# Patient Record
Sex: Male | Born: 1940
Health system: Southern US, Community
[De-identification: ages and names within clinical notes are randomized; demographics above are authoritative.]

## PROBLEM LIST (undated history)

## (undated) DIAGNOSIS — R351 Nocturia: Secondary | ICD-10-CM

## (undated) DIAGNOSIS — R7302 Impaired glucose tolerance (oral): Secondary | ICD-10-CM

## (undated) DIAGNOSIS — K429 Umbilical hernia without obstruction or gangrene: Secondary | ICD-10-CM

## (undated) DIAGNOSIS — K5730 Diverticulosis of large intestine without perforation or abscess without bleeding: Secondary | ICD-10-CM

## (undated) DIAGNOSIS — K219 Gastro-esophageal reflux disease without esophagitis: Secondary | ICD-10-CM

## (undated) DIAGNOSIS — R10816 Epigastric abdominal tenderness: Secondary | ICD-10-CM

## (undated) DIAGNOSIS — E119 Type 2 diabetes mellitus without complications: Secondary | ICD-10-CM

## (undated) DIAGNOSIS — R972 Elevated prostate specific antigen [PSA]: Secondary | ICD-10-CM

## (undated) DIAGNOSIS — F329 Major depressive disorder, single episode, unspecified: Secondary | ICD-10-CM

## (undated) DIAGNOSIS — K649 Unspecified hemorrhoids: Secondary | ICD-10-CM

## (undated) DIAGNOSIS — G8929 Other chronic pain: Secondary | ICD-10-CM

## (undated) DIAGNOSIS — J309 Allergic rhinitis, unspecified: Secondary | ICD-10-CM

## (undated) DIAGNOSIS — M5030 Other cervical disc degeneration, unspecified cervical region: Secondary | ICD-10-CM

## (undated) DIAGNOSIS — M722 Plantar fascial fibromatosis: Secondary | ICD-10-CM

## (undated) DIAGNOSIS — Z86010 Personal history of colonic polyps: Secondary | ICD-10-CM

## (undated) DIAGNOSIS — R5383 Other fatigue: Secondary | ICD-10-CM

## (undated) DIAGNOSIS — M542 Cervicalgia: Secondary | ICD-10-CM

## (undated) DIAGNOSIS — R05 Cough: Secondary | ICD-10-CM

## (undated) DIAGNOSIS — I700 Atherosclerosis of aorta: Secondary | ICD-10-CM

## (undated) DIAGNOSIS — M4800 Spinal stenosis, site unspecified: Secondary | ICD-10-CM

## (undated) DIAGNOSIS — L988 Other specified disorders of the skin and subcutaneous tissue: Secondary | ICD-10-CM

## (undated) DIAGNOSIS — D71 Functional disorders of polymorphonuclear neutrophils: Secondary | ICD-10-CM

## (undated) DIAGNOSIS — L0591 Pilonidal cyst without abscess: Secondary | ICD-10-CM

## (undated) DIAGNOSIS — I10 Essential (primary) hypertension: Secondary | ICD-10-CM

## (undated) DIAGNOSIS — R51 Headache: Secondary | ICD-10-CM

## (undated) DIAGNOSIS — K4090 Unilateral inguinal hernia, without obstruction or gangrene, not specified as recurrent: Secondary | ICD-10-CM

## (undated) DIAGNOSIS — F1021 Alcohol dependence, in remission: Secondary | ICD-10-CM

## (undated) DIAGNOSIS — E785 Hyperlipidemia, unspecified: Secondary | ICD-10-CM

## (undated) DIAGNOSIS — K589 Irritable bowel syndrome without diarrhea: Secondary | ICD-10-CM

## (undated) DIAGNOSIS — F411 Generalized anxiety disorder: Secondary | ICD-10-CM

## (undated) DIAGNOSIS — M545 Low back pain: Secondary | ICD-10-CM

## (undated) DIAGNOSIS — R5381 Other malaise: Secondary | ICD-10-CM

## (undated) HISTORY — PX: COLONOSCOPY: SHX174

## (undated) HISTORY — DX: Major depressive disorder, single episode, unspecified: F32.9

## (undated) HISTORY — DX: Other fatigue: R53.83

## (undated) HISTORY — DX: Irritable bowel syndrome without diarrhea: K58.9

## (undated) HISTORY — PX: BLADDER SURGERY: SHX569

## (undated) HISTORY — DX: Low back pain: M54.5

## (undated) HISTORY — DX: Alcohol dependence, in remission: F10.21

## (undated) HISTORY — DX: Type 2 diabetes mellitus without complications: E11.9

## (undated) HISTORY — DX: Personal history of colonic polyps: Z86.010

## (undated) HISTORY — DX: Other malaise: R53.81

## (undated) HISTORY — DX: Diverticulosis of large intestine without perforation or abscess without bleeding: K57.30

## (undated) HISTORY — DX: Essential (primary) hypertension: I10

## (undated) HISTORY — DX: Allergic rhinitis, unspecified: J30.9

## (undated) HISTORY — DX: Headache: R51

## (undated) HISTORY — DX: Nocturia: R35.1

## (undated) HISTORY — PX: OTHER SURGICAL HISTORY: SHX169

## (undated) HISTORY — DX: Hyperlipidemia, unspecified: E78.5

## (undated) HISTORY — DX: Other cervical disc degeneration, unspecified cervical region: M50.30

## (undated) HISTORY — DX: Cervicalgia: M54.2

## (undated) HISTORY — PX: PILONIDAL CYST EXCISION: SHX744

## (undated) HISTORY — DX: Cough: R05

## (undated) HISTORY — DX: Impaired glucose tolerance (oral): R73.02

## (undated) HISTORY — DX: Plantar fascial fibromatosis: M72.2

## (undated) HISTORY — DX: Epigastric abdominal tenderness: R10.816

## (undated) HISTORY — DX: Gastro-esophageal reflux disease without esophagitis: K21.9

## (undated) HISTORY — DX: Pilonidal cyst without abscess: L05.91

## (undated) HISTORY — PX: CERVICAL FUSION: SHX112

## (undated) HISTORY — DX: Generalized anxiety disorder: F41.1

---

## 1998-11-11 ENCOUNTER — Other Ambulatory Visit: Admission: RE | Admit: 1998-11-11 | Discharge: 1998-11-11 | Payer: Self-pay | Admitting: Gastroenterology

## 1999-02-15 ENCOUNTER — Ambulatory Visit (HOSPITAL_BASED_OUTPATIENT_CLINIC_OR_DEPARTMENT_OTHER): Admission: RE | Admit: 1999-02-15 | Discharge: 1999-02-15 | Payer: Self-pay | Admitting: Orthopedic Surgery

## 2001-04-16 ENCOUNTER — Encounter: Payer: Self-pay | Admitting: Urology

## 2001-04-22 ENCOUNTER — Inpatient Hospital Stay (HOSPITAL_COMMUNITY): Admission: RE | Admit: 2001-04-22 | Discharge: 2001-04-24 | Payer: Self-pay | Admitting: Urology

## 2001-04-22 ENCOUNTER — Encounter (INDEPENDENT_AMBULATORY_CARE_PROVIDER_SITE_OTHER): Payer: Self-pay

## 2004-09-16 ENCOUNTER — Ambulatory Visit: Payer: Self-pay | Admitting: Internal Medicine

## 2005-02-27 ENCOUNTER — Ambulatory Visit: Payer: Self-pay | Admitting: Internal Medicine

## 2005-09-14 ENCOUNTER — Ambulatory Visit: Payer: Self-pay | Admitting: Internal Medicine

## 2005-10-03 ENCOUNTER — Ambulatory Visit: Payer: Self-pay

## 2005-10-12 ENCOUNTER — Ambulatory Visit: Payer: Self-pay | Admitting: *Deleted

## 2005-10-20 ENCOUNTER — Ambulatory Visit: Payer: Self-pay | Admitting: Internal Medicine

## 2006-10-31 ENCOUNTER — Ambulatory Visit: Payer: Self-pay | Admitting: Internal Medicine

## 2006-10-31 LAB — CONVERTED CEMR LAB
ALT: 24 units/L (ref 0–40)
AST: 32 units/L (ref 0–37)
Albumin: 4.2 g/dL (ref 3.5–5.2)
Alkaline Phosphatase: 56 units/L (ref 39–117)
BUN: 9 mg/dL (ref 6–23)
Basophils Absolute: 0.1 10*3/uL (ref 0.0–0.1)
Basophils Relative: 1.5 % — ABNORMAL HIGH (ref 0.0–1.0)
CO2: 30 meq/L (ref 19–32)
Calcium: 9.7 mg/dL (ref 8.4–10.5)
Chloride: 104 meq/L (ref 96–112)
Chol/HDL Ratio, serum: 5.5
Cholesterol: 237 mg/dL (ref 0–200)
Creatinine, Ser: 0.9 mg/dL (ref 0.4–1.5)
Eosinophil percent: 1.4 % (ref 0.0–5.0)
GFR calc non Af Amer: 91 mL/min
Glomerular Filtration Rate, Af Am: 110 mL/min/{1.73_m2}
Glucose, Bld: 97 mg/dL (ref 70–99)
HCT: 42.5 % (ref 39.0–52.0)
HDL: 43.2 mg/dL (ref >39.0)
Hemoglobin: 15.4 g/dL (ref 13.0–17.0)
LDL DIRECT: 131.9 mg/dL
Lymphocytes Relative: 29.2 % (ref 12.0–46.0)
MCHC: 36 g/dL (ref 30.0–36.0)
MCV: 87.6 fL (ref 78.0–100.0)
Monocytes Absolute: 0.6 10*3/uL (ref 0.2–0.7)
Monocytes Relative: 8.2 % (ref 3.0–11.0)
Neutro Abs: 4.2 10*3/uL (ref 1.4–7.7)
Neutrophils Relative %: 59.7 % (ref 43.0–77.0)
PSA: 2.17 ng/mL
PSA: 2.17 ng/mL (ref 0.10–4.00)
Platelets: 276 10*3/uL (ref 150–400)
Potassium: 4.8 meq/L (ref 3.5–5.1)
RBC: 4.85 M/uL (ref 4.22–5.81)
RDW: 11.9 % (ref 11.5–14.6)
Sodium: 140 meq/L (ref 135–145)
TSH: 1.28 microintl units/mL (ref 0.35–5.50)
Total Bilirubin: 0.8 mg/dL (ref 0.3–1.2)
Total Protein: 7.3 g/dL (ref 6.0–8.3)
Triglyceride fasting, serum: 250 mg/dL (ref 0–149)
VLDL: 50 mg/dL — ABNORMAL HIGH (ref 0–40)
WBC: 7 10*3/uL (ref 4.5–10.5)

## 2006-12-04 ENCOUNTER — Ambulatory Visit: Payer: Self-pay | Admitting: Internal Medicine

## 2006-12-04 LAB — CONVERTED CEMR LAB
ALT: 31 units/L (ref 0–40)
AST: 28 units/L (ref 0–37)
Albumin: 3.7 g/dL (ref 3.5–5.2)
Alkaline Phosphatase: 50 units/L (ref 39–117)
Bilirubin, Direct: 0.2 mg/dL (ref 0.0–0.3)
Cholesterol: 138 mg/dL (ref 0–200)
HDL: 44.7 mg/dL (ref >39.0)
LDL Cholesterol: 65 mg/dL (ref 0–99)
Total Bilirubin: 0.6 mg/dL (ref 0.3–1.2)
Total CHOL/HDL Ratio: 3.1
Total Protein: 7.1 g/dL (ref 6.0–8.3)
Triglycerides: 141 mg/dL (ref 0–149)
VLDL: 28 mg/dL (ref 0–40)

## 2007-06-07 ENCOUNTER — Ambulatory Visit: Payer: Self-pay | Admitting: Internal Medicine

## 2007-06-07 LAB — CONVERTED CEMR LAB
ALT: 27 units/L (ref 0–53)
AST: 41 units/L — ABNORMAL HIGH (ref 0–37)
Albumin: 4.1 g/dL (ref 3.5–5.2)
Alkaline Phosphatase: 51 units/L (ref 39–117)
Bilirubin, Direct: 0.3 mg/dL (ref 0.0–0.3)
Cholesterol: 166 mg/dL (ref 0–200)
Direct LDL: 95 mg/dL
HDL: 45.2 mg/dL (ref >39.0)
PSA: 2.33 ng/mL (ref 0.10–4.00)
Total Bilirubin: 1.3 mg/dL — ABNORMAL HIGH (ref 0.3–1.2)
Total CHOL/HDL Ratio: 3.7
Total Protein: 7.5 g/dL (ref 6.0–8.3)
Triglycerides: 212 mg/dL (ref 0–149)
VLDL: 42 mg/dL — ABNORMAL HIGH (ref 0–40)

## 2007-06-08 ENCOUNTER — Encounter: Payer: Self-pay | Admitting: Internal Medicine

## 2007-06-08 DIAGNOSIS — R51 Headache: Secondary | ICD-10-CM

## 2007-06-08 DIAGNOSIS — K589 Irritable bowel syndrome without diarrhea: Secondary | ICD-10-CM | POA: Insufficient documentation

## 2007-06-08 DIAGNOSIS — F329 Major depressive disorder, single episode, unspecified: Secondary | ICD-10-CM

## 2007-06-08 DIAGNOSIS — R519 Headache, unspecified: Secondary | ICD-10-CM | POA: Insufficient documentation

## 2007-06-08 DIAGNOSIS — F1021 Alcohol dependence, in remission: Secondary | ICD-10-CM

## 2007-06-08 DIAGNOSIS — Z860100 Personal history of colon polyps, unspecified: Secondary | ICD-10-CM

## 2007-06-08 DIAGNOSIS — K5730 Diverticulosis of large intestine without perforation or abscess without bleeding: Secondary | ICD-10-CM

## 2007-06-08 DIAGNOSIS — E785 Hyperlipidemia, unspecified: Secondary | ICD-10-CM

## 2007-06-08 DIAGNOSIS — M722 Plantar fascial fibromatosis: Secondary | ICD-10-CM

## 2007-06-08 DIAGNOSIS — K219 Gastro-esophageal reflux disease without esophagitis: Secondary | ICD-10-CM

## 2007-06-08 DIAGNOSIS — I10 Essential (primary) hypertension: Secondary | ICD-10-CM

## 2007-06-08 DIAGNOSIS — F411 Generalized anxiety disorder: Secondary | ICD-10-CM

## 2007-06-08 DIAGNOSIS — J309 Allergic rhinitis, unspecified: Secondary | ICD-10-CM

## 2007-06-08 DIAGNOSIS — L0591 Pilonidal cyst without abscess: Secondary | ICD-10-CM | POA: Insufficient documentation

## 2007-06-08 DIAGNOSIS — F3289 Other specified depressive episodes: Secondary | ICD-10-CM | POA: Insufficient documentation

## 2007-06-08 HISTORY — DX: Essential (primary) hypertension: I10

## 2007-06-08 HISTORY — DX: Allergic rhinitis, unspecified: J30.9

## 2007-06-08 HISTORY — DX: Other specified depressive episodes: F32.89

## 2007-06-08 HISTORY — DX: Personal history of colon polyps, unspecified: Z86.0100

## 2007-06-08 HISTORY — DX: Alcohol dependence, in remission: F10.21

## 2007-06-08 HISTORY — DX: Plantar fascial fibromatosis: M72.2

## 2007-06-08 HISTORY — DX: Headache: R51

## 2007-06-08 HISTORY — DX: Personal history of colonic polyps: Z86.010

## 2007-06-08 HISTORY — DX: Generalized anxiety disorder: F41.1

## 2007-06-08 HISTORY — DX: Pilonidal cyst without abscess: L05.91

## 2007-06-08 HISTORY — DX: Major depressive disorder, single episode, unspecified: F32.9

## 2007-06-08 HISTORY — DX: Hyperlipidemia, unspecified: E78.5

## 2007-06-08 HISTORY — DX: Diverticulosis of large intestine without perforation or abscess without bleeding: K57.30

## 2007-06-08 HISTORY — DX: Irritable bowel syndrome, unspecified: K58.9

## 2007-06-08 HISTORY — DX: Gastro-esophageal reflux disease without esophagitis: K21.9

## 2007-07-30 ENCOUNTER — Ambulatory Visit: Payer: Self-pay | Admitting: Internal Medicine

## 2007-07-30 ENCOUNTER — Encounter: Payer: Self-pay | Admitting: Internal Medicine

## 2007-07-30 DIAGNOSIS — M5030 Other cervical disc degeneration, unspecified cervical region: Secondary | ICD-10-CM

## 2007-07-30 HISTORY — DX: Other cervical disc degeneration, unspecified cervical region: M50.30

## 2007-11-11 ENCOUNTER — Encounter: Payer: Self-pay | Admitting: Internal Medicine

## 2007-11-25 ENCOUNTER — Encounter: Payer: Self-pay | Admitting: Internal Medicine

## 2007-11-25 ENCOUNTER — Telehealth: Payer: Self-pay | Admitting: Internal Medicine

## 2007-12-05 ENCOUNTER — Ambulatory Visit: Payer: Self-pay | Admitting: Internal Medicine

## 2007-12-05 DIAGNOSIS — M542 Cervicalgia: Secondary | ICD-10-CM

## 2007-12-05 DIAGNOSIS — R10816 Epigastric abdominal tenderness: Secondary | ICD-10-CM

## 2007-12-05 HISTORY — DX: Epigastric abdominal tenderness: R10.816

## 2007-12-05 HISTORY — DX: Cervicalgia: M54.2

## 2008-09-10 ENCOUNTER — Ambulatory Visit: Payer: Self-pay | Admitting: Internal Medicine

## 2008-09-10 DIAGNOSIS — R5383 Other fatigue: Secondary | ICD-10-CM

## 2008-09-10 DIAGNOSIS — R5381 Other malaise: Secondary | ICD-10-CM

## 2008-09-10 HISTORY — DX: Other malaise: R53.81

## 2008-09-10 LAB — CONVERTED CEMR LAB: Vit D, 1,25-Dihydroxy: 30 (ref 30–89)

## 2008-09-11 LAB — CONVERTED CEMR LAB
ALT: 23 units/L (ref 0–53)
AST: 33 units/L (ref 0–37)
Albumin: 4.2 g/dL (ref 3.5–5.2)
Alkaline Phosphatase: 50 units/L (ref 39–117)
BUN: 14 mg/dL (ref 6–23)
Basophils Absolute: 0 10*3/uL (ref 0.0–0.1)
Basophils Relative: 0.8 % (ref 0.0–3.0)
Bilirubin Urine: NEGATIVE
Bilirubin, Direct: 0.2 mg/dL (ref 0.0–0.3)
CO2: 27 meq/L (ref 19–32)
Calcium: 9.4 mg/dL (ref 8.4–10.5)
Chloride: 103 meq/L (ref 96–112)
Creatinine, Ser: 0.8 mg/dL (ref 0.4–1.5)
Eosinophils Absolute: 0.1 10*3/uL (ref 0.0–0.7)
Eosinophils Relative: 2.1 % (ref 0.0–5.0)
GFR calc Af Amer: 125 mL/min
GFR calc non Af Amer: 103 mL/min
Glucose, Bld: 130 mg/dL — ABNORMAL HIGH (ref 70–99)
HCT: 44.3 % (ref 39.0–52.0)
Hemoglobin, Urine: NEGATIVE
Hemoglobin: 15.5 g/dL (ref 13.0–17.0)
Hgb A1c MFr Bld: 6.1 % — ABNORMAL HIGH (ref 4.6–6.0)
Ketones, ur: NEGATIVE mg/dL
Leukocytes, UA: NEGATIVE
Lymphocytes Relative: 32.5 % (ref 12.0–46.0)
MCHC: 35 g/dL (ref 30.0–36.0)
MCV: 91.9 fL (ref 78.0–100.0)
Monocytes Absolute: 0.6 10*3/uL (ref 0.1–1.0)
Monocytes Relative: 12.3 % — ABNORMAL HIGH (ref 3.0–12.0)
Neutro Abs: 2.5 10*3/uL (ref 1.4–7.7)
Neutrophils Relative %: 52.3 % (ref 43.0–77.0)
Nitrite: NEGATIVE
PSA: 1.87 ng/mL (ref 0.10–4.00)
Platelets: 234 10*3/uL (ref 150–400)
Potassium: 4.2 meq/L (ref 3.5–5.1)
RBC: 4.82 M/uL (ref 4.22–5.81)
RDW: 11.5 % (ref 11.5–14.6)
Sodium: 137 meq/L (ref 135–145)
Specific Gravity, Urine: 1.02 (ref 1.000–1.03)
TSH: 1.31 microintl units/mL (ref 0.35–5.50)
Total Bilirubin: 1.3 mg/dL — ABNORMAL HIGH (ref 0.3–1.2)
Total Protein, Urine: NEGATIVE mg/dL
Total Protein: 7.3 g/dL (ref 6.0–8.3)
Urine Glucose: NEGATIVE mg/dL
Urobilinogen, UA: 0.2 (ref 0.0–1.0)
WBC: 4.8 10*3/uL (ref 4.5–10.5)
pH: 6 (ref 5.0–8.0)

## 2008-10-19 ENCOUNTER — Ambulatory Visit: Payer: Self-pay | Admitting: Internal Medicine

## 2008-11-03 ENCOUNTER — Encounter: Payer: Self-pay | Admitting: Internal Medicine

## 2008-11-03 ENCOUNTER — Ambulatory Visit: Payer: Self-pay | Admitting: Internal Medicine

## 2008-11-05 ENCOUNTER — Encounter: Payer: Self-pay | Admitting: Internal Medicine

## 2009-07-16 ENCOUNTER — Ambulatory Visit: Payer: Self-pay | Admitting: Internal Medicine

## 2009-07-16 DIAGNOSIS — R351 Nocturia: Secondary | ICD-10-CM

## 2009-07-16 HISTORY — DX: Nocturia: R35.1

## 2009-07-16 LAB — CONVERTED CEMR LAB
ALT: 25 units/L (ref 0–53)
AST: 33 units/L (ref 0–37)
Albumin: 4.2 g/dL (ref 3.5–5.2)
Alkaline Phosphatase: 44 units/L (ref 39–117)
BUN: 13 mg/dL (ref 6–23)
Basophils Absolute: 0 10*3/uL (ref 0.0–0.1)
Basophils Relative: 0 % (ref 0.0–3.0)
Bilirubin Urine: NEGATIVE
Bilirubin, Direct: 0.2 mg/dL (ref 0.0–0.3)
CO2: 28 meq/L (ref 19–32)
Calcium: 9.2 mg/dL (ref 8.4–10.5)
Chloride: 107 meq/L (ref 96–112)
Cholesterol: 144 mg/dL (ref 0–200)
Creatinine, Ser: 0.9 mg/dL (ref 0.4–1.5)
Eosinophils Absolute: 0.2 10*3/uL (ref 0.0–0.7)
Eosinophils Relative: 2.9 % (ref 0.0–5.0)
GFR calc non Af Amer: 89.79 mL/min (ref >60)
Glucose, Bld: 120 mg/dL — ABNORMAL HIGH (ref 70–99)
HCT: 42.7 % (ref 39.0–52.0)
HDL: 49 mg/dL (ref >39.00)
Hemoglobin, Urine: NEGATIVE
Hemoglobin: 14.8 g/dL (ref 13.0–17.0)
Hgb A1c MFr Bld: 6 % (ref 4.6–6.5)
Ketones, ur: NEGATIVE mg/dL
LDL Cholesterol: 82 mg/dL (ref 0–99)
Leukocytes, UA: NEGATIVE
Lymphocytes Relative: 30.8 % (ref 12.0–46.0)
Lymphs Abs: 1.7 10*3/uL (ref 0.7–4.0)
MCHC: 34.8 g/dL (ref 30.0–36.0)
MCV: 91 fL (ref 78.0–100.0)
Monocytes Absolute: 0.6 10*3/uL (ref 0.1–1.0)
Monocytes Relative: 10 % (ref 3.0–12.0)
Neutro Abs: 3.1 10*3/uL (ref 1.4–7.7)
Neutrophils Relative %: 56.3 % (ref 43.0–77.0)
Nitrite: NEGATIVE
PSA: 1.7 ng/mL (ref 0.10–4.00)
Platelets: 209 10*3/uL (ref 150.0–400.0)
Potassium: 4.1 meq/L (ref 3.5–5.1)
RBC: 4.69 M/uL (ref 4.22–5.81)
RDW: 11.5 % (ref 11.5–14.6)
Sodium: 141 meq/L (ref 135–145)
Specific Gravity, Urine: >=1.03 (ref 1.000–1.030)
TSH: 1.66 microintl units/mL (ref 0.35–5.50)
Total Bilirubin: 1.4 mg/dL — ABNORMAL HIGH (ref 0.3–1.2)
Total CHOL/HDL Ratio: 3
Total Protein, Urine: NEGATIVE mg/dL
Total Protein: 7.8 g/dL (ref 6.0–8.3)
Triglycerides: 67 mg/dL (ref 0.0–149.0)
Urine Glucose: NEGATIVE mg/dL
Urobilinogen, UA: 0.2 (ref 0.0–1.0)
VLDL: 13.4 mg/dL (ref 0.0–40.0)
WBC: 5.6 10*3/uL (ref 4.5–10.5)
pH: 5.5 (ref 5.0–8.0)

## 2009-10-25 ENCOUNTER — Telehealth: Payer: Self-pay | Admitting: Internal Medicine

## 2010-08-12 ENCOUNTER — Ambulatory Visit: Payer: Self-pay | Admitting: Internal Medicine

## 2010-08-12 DIAGNOSIS — R05 Cough: Secondary | ICD-10-CM

## 2010-08-12 DIAGNOSIS — R059 Cough, unspecified: Secondary | ICD-10-CM

## 2010-08-12 DIAGNOSIS — M545 Low back pain, unspecified: Secondary | ICD-10-CM

## 2010-08-12 DIAGNOSIS — G8929 Other chronic pain: Secondary | ICD-10-CM | POA: Insufficient documentation

## 2010-08-12 HISTORY — DX: Low back pain, unspecified: M54.50

## 2010-08-12 HISTORY — DX: Cough, unspecified: R05.9

## 2010-08-15 LAB — CONVERTED CEMR LAB
ALT: 37 units/L (ref 0–53)
AST: 42 units/L — ABNORMAL HIGH (ref 0–37)
Alkaline Phosphatase: 62 units/L (ref 39–117)
BUN: 11 mg/dL (ref 6–23)
Basophils Absolute: 0 10*3/uL (ref 0.0–0.1)
Bilirubin Urine: NEGATIVE
Calcium: 9.4 mg/dL (ref 8.4–10.5)
Eosinophils Relative: 1.7 % (ref 0.0–5.0)
GFR calc non Af Amer: 93.07 mL/min (ref >60)
Glucose, Bld: 159 mg/dL — ABNORMAL HIGH (ref 70–99)
HDL: 42.6 mg/dL (ref >39.00)
Hemoglobin, Urine: NEGATIVE
Hemoglobin: 15 g/dL (ref 13.0–17.0)
Leukocytes, UA: NEGATIVE
Lymphocytes Relative: 25.1 % (ref 12.0–46.0)
Monocytes Relative: 7.3 % (ref 3.0–12.0)
Nitrite: NEGATIVE
Platelets: 260 10*3/uL (ref 150.0–400.0)
Potassium: 5.2 meq/L — ABNORMAL HIGH (ref 3.5–5.1)
RDW: 12.3 % (ref 11.5–14.6)
Sodium: 138 meq/L (ref 135–145)
Total Bilirubin: 0.8 mg/dL (ref 0.3–1.2)
Total CHOL/HDL Ratio: 5
Total Protein, Urine: NEGATIVE mg/dL
Triglycerides: 377 mg/dL — ABNORMAL HIGH (ref 0.0–149.0)
Urobilinogen, UA: 0.2 (ref 0.0–1.0)
VLDL: 75.4 mg/dL — ABNORMAL HIGH (ref 0.0–40.0)
WBC: 7.7 10*3/uL (ref 4.5–10.5)

## 2010-11-29 NOTE — Assessment & Plan Note (Signed)
Summary: FU---MEDS---STC   Vital Signs:  Patient profile:   70 year old male Height:      70 inches Weight:      205.25 pounds BMI:     29.56 O2 Sat:      95 % on Room air Temp:     98 degrees F oral Pulse rate:   77 / minute BP sitting:   140 / 98  (left arm) Cuff size:   large  Vitals Entered By: Zella Ball Ewing CMA Duncan Dull) (August 12, 2010 2:39 PM)  O2 Flow:  Room air CC: followup/RE   CC:  followup/RE.  History of Present Illness: here for f/u - overall doing well, but has dry cough for 1 mo also assoc with intermittent ST;  ;  wt overall stable but has occasionaly reflux symtpoms now well controlled with current tx and diet;  zantac seemed to help somewhat; no dysphagia, n/v, abd pain, bowel change or blood. BP at home usually < 14090;  Pt denies other CP, nasal allergy symptoms, worsening sob, doe, wheezing, orthopnea, pnd, worsening LE edema, palps, dizziness or syncope Pt denies new neuro symptoms such as headache, facial or extremity weakness Does have nightly chronic LBP without change in freq, severity, or other character such as worsening LE pain/weaknumb, gait change or fall .  Pain during the day manageable.    Preventive Screening-Counseling & Management      Drug Use:  no.    Problems Prior to Update: 1)  Fatigue  (ICD-780.79) 2)  Cough  (ICD-786.2) 3)  Nocturia  (ICD-788.43) 4)  Fatigue  (ICD-780.79) 5)  Special Screening Malig Neoplasms Other Sites  (ICD-V76.49) 6)  Fatigue  (ICD-780.79) 7)  Cervicalgia  (ICD-723.1) 8)  Epigastric Tenderness  (ICD-789.66) 9)  Hx of Degeneration, Cervical Disc  (ICD-722.4) 10)  Pilonidal Cyst  (ICD-685.1) 11)  Colonic Polyps, Hx of  (ICD-V12.72) 12)  Plantar Fasciitis  (ICD-728.71) 13)  Anxiety  (ICD-300.00) 14)  Allergic Rhinitis  (ICD-477.9) 15)  Dependence, Alcohol Nec/nos, in Remission  (ICD-303.93) 16)  Ibs  (ICD-564.1) 17)  Hypertension  (ICD-401.9) 18)  Hyperlipidemia  (ICD-272.4) 19)  Headache  (ICD-784.0) 20)   Gerd  (ICD-530.81) 21)  Diverticulosis, Colon  (ICD-562.10) 22)  Depression  (ICD-311)  Medications Prior to Update: 1)  Lisinopril 40 Mg Tabs (Lisinopril) .... Take 1 Tablet By Mouth Once A Day 2)  Simvastatin 80 Mg Tabs (Simvastatin) .... Take 1 Tablet By Mouth Once A Day 3)  Hydrocodone-Acetaminophen 10-325 Mg Tabs (Hydrocodone-Acetaminophen) .Marland Kitchen.. 1 By Mouth Q 12 Hrs As Needed Pain 4)  Adult Aspirin Ec Low Strength 81 Mg Tbec (Aspirin) .Marland Kitchen.. 1 By Mouth Once Daily 5)  Toprol Xl 50 Mg Xr24h-Tab (Metoprolol Succinate) .... Take Half Tab Once Daily 6)  Daily Multiple Vitamins  Tabs (Multiple Vitamin) .Marland Kitchen.. 1 By Mouth Once Daily 7)  Glucosamine 500 Mg Caps (Glucosamine Sulfate) .... Use As Directed 8)  Omega 3 340 Mg Cpdr (Omega-3 Fatty Acids) .Marland Kitchen.. 1 By Mouth Once Daily 9)  Amlodipine Besylate 2.5 Mg Tabs (Amlodipine Besylate) .Marland Kitchen.. 1 By Mouth Once Daily 10)  Prilosec Otc 20 Mg Tbec (Omeprazole Magnesium) .Marland Kitchen.. 1 By Mouth Once Daily  Current Medications (verified): 1)  Lisinopril 40 Mg Tabs (Lisinopril) .... Take 1 Tablet By Mouth Once A Day 2)  Simvastatin 40 Mg Tabs (Simvastatin) .Marland Kitchen.. 1po Once Daily 3)  Hydrocodone-Acetaminophen 10-325 Mg Tabs (Hydrocodone-Acetaminophen) .Marland Kitchen.. 1 By Mouth Q 12 Hrs As Needed Pain 4)  Adult Aspirin Ec Low  Strength 81 Mg Tbec (Aspirin) .Marland Kitchen.. 1 By Mouth Once Daily 5)  Toprol Xl 50 Mg Xr24h-Tab (Metoprolol Succinate) .... Take Half Tab Once Daily 6)  Daily Multiple Vitamins  Tabs (Multiple Vitamin) .Marland Kitchen.. 1 By Mouth Once Daily 7)  Glucosamine 500 Mg Caps (Glucosamine Sulfate) .... Use As Directed 8)  Omega 3 340 Mg Cpdr (Omega-3 Fatty Acids) .Marland Kitchen.. 1 By Mouth Once Daily 9)  Amlodipine Besylate 2.5 Mg Tabs (Amlodipine Besylate) .Marland Kitchen.. 1 By Mouth Once Daily 10)  Lansoprazole 30 Mg Tbdp (Lansoprazole) .Marland Kitchen.. 1 By Mouth Once Daily  Allergies (verified): 1)  ! Sulfa 2)  ! Doxycycline Hyclate (Doxycycline Hyclate)  Past History:  Past Medical History: Last updated:  07/30/2007 Depression Diverticulosis, colon GERD Headache Hyperlipidemia Hypertension IBS HX Alcohol Dependency Allergic rhinitis Anxiety Plantar Fascitis Colonic polyps, hx of chronic neck pain  Past Surgical History: Last updated: 07/30/2007 Pilonidal Cyst Surgery Bladder and Prostate Surgery Cervical fusion  Family History: Last updated: 12/05/2007 father with kidney disease mother with mature heart disease  Social History: Last updated: 08/12/2010 Former Smoker Alcohol use-no Divorced 3 children retired Conservator, 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    Physical Exam  General:  alert and overweight-appearing.   Head:  normocephalic and atraumatic.   Eyes:  vision grossly intact, pupils equal, and pupils round.   Ears:  R ear normal and L ear normal.   Nose:  no external deformity and no nasal discharge.   Mouth:  no gingival abnormalities and pharynx pink and moist.   Neck:  supple and no masses.   Lungs:  normal respiratory effort and normal breath sounds.   Heart:  normal rate and regular rhythm.   Abdomen:  soft, non-tender, and normal bowel sounds.   Msk:  no joint tenderness and no joint swelling.   Extremities:  no edema, no erythema  Neurologic:  cranial nerves II-XII intact and strength normal in all extremities.   Skin:  color normal and no rashes.   Psych:  not depressed appearing and moderately anxious.     Impression & Recommendations:  Problem # 1:  COUGH (ICD-786.2)  suspect gerd related - for cxr, but also generic prevacid (or otc omeprazole if too expensive)  Orders: T-2 View CXR,  Same Day (71020.5TC)  Problem # 2:  HYPERTENSION (ICD-401.9)  His updated medication list for this problem includes:    Lisinopril 40 Mg Tabs (Lisinopril) .Marland Kitchen... Take 1 tablet by mouth once a day    Toprol Xl 50 Mg Xr24h-tab (Metoprolol succinate) .Marland Kitchen... Take half tab once daily    Amlodipine Besylate 2.5 Mg Tabs (Amlodipine besylate) .Marland Kitchen... 1 by mouth once daily  Orders: TLB-Udip ONLY (81003-UDIP)  BP today: 140/98 Prior BP: 136/96 (07/16/2009)  Labs Reviewed: K+: 4.1 (07/16/2009) Creat: : 0.9 (07/16/2009)   Chol: 144 (07/16/2009)   HDL: 49.00 (07/16/2009)   LDL: 82 (07/16/2009)   TG: 67.0 (07/16/2009) stable overall by hx and exam, ok to continue meds/tx as is , f/u closely at home and next visit  Problem # 3:  HYPERLIPIDEMIA (ICD-272.4)  His updated medication list for this problem includes:    Simvastatin 40 Mg  Tabs (Simvastatin) .Marland Kitchen... 1po once daily  Labs Reviewed: SGOT: 33 (07/16/2009)   SGPT: 25 (07/16/2009)   HDL:49.00 (07/16/2009), 45.2 (06/07/2007)  LDL:82 (07/16/2009), DEL (16/07/9603)  Chol:144 (07/16/2009), 166 (06/07/2007)  Trig:67.0 (07/16/2009), 212 (06/07/2007) to decr the zocor to 40 mg to avoid the risky 80 mg strength per the FDA warning of risk  Orders: TLB-Lipid Panel (80061-LIPID)  Problem # 4:  FATIGUE (ICD-780.79)  exam benign, to check labs below; follow with expectant management   Orders: TLB-BMP (Basic Metabolic Panel-BMET) (80048-METABOL) TLB-CBC Platelet - w/Differential (85025-CBCD) TLB-Hepatic/Liver Function Pnl (80076-HEPATIC) TLB-TSH (Thyroid Stimulating Hormone) (84443-TSH) TLB-Sedimentation Rate (ESR) (85652-ESR)  Problem # 5:  LOW BACK PAIN, CHRONIC (ICD-724.2)  His updated medication list for this problem includes:    Hydrocodone-acetaminophen 10-325 Mg Tabs (Hydrocodone-acetaminophen) .Marland Kitchen... 1 by mouth q 12 hrs as needed pain    Adult Aspirin Ec Low Strength 81 Mg Tbec (Aspirin) .Marland Kitchen... 1 by mouth once daily and chronic cervical neck  pain; exam stable today, Continue all previous medications as before this visit   Complete Medication List: 1)  Lisinopril 40 Mg Tabs (Lisinopril) .... Take 1 tablet by mouth once a day 2)  Simvastatin 40 Mg Tabs (Simvastatin) .Marland Kitchen.. 1po once daily 3)  Hydrocodone-acetaminophen 10-325 Mg Tabs (Hydrocodone-acetaminophen) .Marland Kitchen.. 1 by mouth q 12 hrs as needed pain 4)  Adult Aspirin Ec Low Strength 81 Mg Tbec (Aspirin) .Marland Kitchen.. 1 by mouth once daily 5)  Toprol Xl 50 Mg Xr24h-tab (Metoprolol succinate) .... Take half tab once daily 6)  Daily Multiple Vitamins Tabs (Multiple vitamin) .Marland Kitchen.. 1 by mouth once daily 7)  Glucosamine 500 Mg Caps (Glucosamine sulfate) .... Use as directed 8)  Omega 3 340 Mg Cpdr (Omega-3 fatty acids) .Marland Kitchen.. 1 by mouth once daily 9)  Amlodipine Besylate 2.5 Mg Tabs (Amlodipine besylate) .Marland Kitchen.. 1 by mouth once daily 10)  Lansoprazole 30 Mg Tbdp (Lansoprazole) .Marland Kitchen.. 1 by mouth once daily  Other Orders: Flu Vaccine 97yrs + MEDICARE PATIENTS (V4098) Administration Flu vaccine - MCR (G0008) TLB-PSA (Prostate Specific Antigen) (84153-PSA)  Patient Instructions: 1)  Please take all new medications as prescribed - the generic prevacid (or the OTC prilosec if too expensive) 2)  Please go to Radiology in the basement level for your X-Ray today  3)  Please go to the Lab in the basement for your blood and/or urine tests today  4)  Please call the number on the [facility] Card for results of your testing  5)  All of your medications were refilled today, and we had to decrease the simvastatin from 80 to 40 mg per day due to FDA warning regarding too much risk of side effect with the 80 mg 6)  Please schedule a follow-up appointment in 6 months. 7)  Check your Blood Pressure regularly. If it is above 140/90: you should make an appointment. 8)  Please bring your BP machine with you to the next visit to compare Prescriptions: LISINOPRIL 40 MG TABS (LISINOPRIL) Take 1 tablet by mouth once a day  #90 x 3    Entered and Authorized by:   Corwin Levins MD   Signed by:   Corwin Levins MD on 08/12/2010   Method used:   Print then Give to Patient   RxID:   [id number] SIMVASTATIN 40 MG TABS (SIMVASTATIN) 1po once daily  #90 x 3   Entered and Authorized by:   Corwin Levins MD   Signed by:   Corwin Levins MD on 08/12/2010  Method used:   Print then Give to Patient   RxID:   [id number] HYDROCODONE-ACETAMINOPHEN 10-325 MG TABS (HYDROCODONE-ACETAMINOPHEN) 1 by mouth q 12 hrs as needed pain  #60 x 3   Entered and Authorized by:   Corwin Levins MD   Signed by:   Corwin Levins MD on 08/12/2010   Method used:   Print then Give to Patient   RxID:   [id number] TOPROL XL 50 MG XR24H-TAB (METOPROLOL SUCCINATE) take half tab once daily  #90 x 3   Entered and Authorized by:   Corwin Levins MD   Signed by:   Corwin Levins MD on 08/12/2010   Method used:   Print then Give to Patient   RxID:   [id number] AMLODIPINE BESYLATE 2.5 MG TABS (AMLODIPINE BESYLATE) 1 by mouth once daily  #90 x 3   Entered and Authorized by:   Corwin Levins MD   Signed by:   Corwin Levins MD on 08/12/2010   Method used:   Print then Give to Patient   RxID:   [id number] LANSOPRAZOLE 30 MG TBDP (LANSOPRAZOLE) 1 by mouth once daily  #30 x 11   Entered and Authorized by:   Corwin Levins MD   Signed by:   Corwin Levins MD on 08/12/2010   Method used:   Print then Give to Patient   RxID:   [id number]     Flu Vaccine Consent Questions     Do you have a history of severe allergic reactions to this vaccine? no    Any prior history of allergic reactions to egg and/or gelatin? no    Do you have a sensitivity to the preservative Thimersol? no    Do you have a past history of Guillan-Barre Syndrome? no    Do you currently have an acute febrile illness? no    Have you ever had a severe reaction to latex? no    Vaccine information given and explained to patient? yes    Are you currently pregnant? no    Lot  Number:[id number]   Exp Date:04/29/2011   Site Given  Left Deltoid IMu1

## 2011-02-10 ENCOUNTER — Other Ambulatory Visit (INDEPENDENT_AMBULATORY_CARE_PROVIDER_SITE_OTHER): Payer: Medicare Other

## 2011-02-10 ENCOUNTER — Encounter: Payer: Self-pay | Admitting: Internal Medicine

## 2011-02-10 ENCOUNTER — Ambulatory Visit (INDEPENDENT_AMBULATORY_CARE_PROVIDER_SITE_OTHER): Payer: Medicare Other | Admitting: Internal Medicine

## 2011-02-10 ENCOUNTER — Other Ambulatory Visit (INDEPENDENT_AMBULATORY_CARE_PROVIDER_SITE_OTHER): Payer: Medicare Other | Admitting: Internal Medicine

## 2011-02-10 VITALS — BP 150/90 | HR 64 | Temp 98.2°F | Ht 70.0 in | Wt 194.5 lb

## 2011-02-10 DIAGNOSIS — R7302 Impaired glucose tolerance (oral): Secondary | ICD-10-CM

## 2011-02-10 DIAGNOSIS — I10 Essential (primary) hypertension: Secondary | ICD-10-CM

## 2011-02-10 DIAGNOSIS — E119 Type 2 diabetes mellitus without complications: Secondary | ICD-10-CM | POA: Insufficient documentation

## 2011-02-10 DIAGNOSIS — R7309 Other abnormal glucose: Secondary | ICD-10-CM

## 2011-02-10 DIAGNOSIS — E785 Hyperlipidemia, unspecified: Secondary | ICD-10-CM

## 2011-02-10 DIAGNOSIS — F411 Generalized anxiety disorder: Secondary | ICD-10-CM

## 2011-02-10 HISTORY — DX: Impaired glucose tolerance (oral): R73.02

## 2011-02-10 LAB — BASIC METABOLIC PANEL
CO2: 28 mEq/L (ref 19–32)
Chloride: 101 mEq/L (ref 96–112)
Creatinine, Ser: 0.8 mg/dL (ref 0.4–1.5)
Sodium: 138 mEq/L (ref 135–145)

## 2011-02-10 LAB — HEMOGLOBIN A1C: Hgb A1c MFr Bld: 6.4 % (ref 4.6–6.5)

## 2011-02-10 LAB — LIPID PANEL
Cholesterol: 213 mg/dL — ABNORMAL HIGH (ref 0–200)
Total CHOL/HDL Ratio: 4

## 2011-02-10 LAB — LDL CHOLESTEROL, DIRECT: Direct LDL: 139 mg/dL

## 2011-02-10 MED ORDER — SIMVASTATIN 40 MG PO TABS: 40.0000 mg | ORAL_TABLET | Freq: Every day | ORAL | Status: DC

## 2011-02-10 NOTE — Assessment & Plan Note (Signed)
stable overall by hx and exam, most recent lab reviewed with pt, and pt to continue medical treatment as before  For a1c check today

## 2011-02-10 NOTE — Assessment & Plan Note (Addendum)
stable overall by hx and exam, most recent lab reviewed with pt, and pt to continue medical treatment as before   BP Readings from Last 3 Encounters:  02/10/11 150/90  08/12/10 140/98  07/16/09 136/96   Lab Results  Component Value Date   WBC 7.7 08/12/2010   HGB 15.0 08/12/2010   HGB NEGATIVE 08/12/2010   HCT 42.1 08/12/2010   PLT 260.0 08/12/2010   CHOL 195 08/12/2010   TRIG 377.0* 08/12/2010   HDL 42.60 08/12/2010   LDLDIRECT 109.4 08/12/2010   ALT 37 08/12/2010   AST 42* 08/12/2010   NA 138 08/12/2010   K 5.2* 08/12/2010   CL 102 08/12/2010   CREATININE 0.9 08/12/2010   BUN 11 08/12/2010   CO2 28 08/12/2010   TSH 0.95 08/12/2010   PSA 2.21 08/12/2010   HGBA1C 6.0 07/16/2009

## 2011-02-10 NOTE — Assessment & Plan Note (Signed)
stable overall by hx and exam, most recent lab reviewed with pt, and pt to continue medical treatment as before  Lab Results  Component Value Date   LDLCALC 82 07/16/2009   To check labs today, goal ldl < 100

## 2011-02-10 NOTE — Patient Instructions (Addendum)
Continue all other medications as before Your EKG was ok today Please go to LAB in the Basement for the blood and/or urine tests to be done today Please call the number on the [facility] Card (the [facility]) for results of testing in 2-3 days Please return in 6 months or sooner if needed

## 2011-02-10 NOTE — Assessment & Plan Note (Signed)
stable overall by hx and exam, most recent lab reviewed with pt, and pt to continue medical treatment as before 

## 2011-02-10 NOTE — Progress Notes (Signed)
Subjective:    Patient ID: David Bolton, male    DOB: [birth year], 70 y.o.   MRN: [id number]  HPI  Here to f/u; overall doing ok but notices at home BP is approx similar to today this am, but later in the day after takes his meds in the am BP controlled int eh 110 to 130 SBP.  Does not want more meds, and his machine matches our readings in the office today.  Pt denies chest pain, increased sob or doe, wheezing, orthopnea, PND, increased LE swelling, palpitations, dizziness or syncope. Pt denies new neurological symptoms such as new headache, or facial or extremity weakness or numbness   Pt denies polydipsia, polyuria  Pt states overall good compliance with meds, trying to follow lower cholesterol diet, wt down 10 lbs with more exercise.  In fact take mult suppelments and tomato juice and quite a bit of fiber with normal but freq stools 7 per day, no blood or abd pain.   Pt denies fever, wt loss, night sweats, loss of appetite, or other constitutional symptoms  Overall good compliance with treatment, and good medicine tolerability, except does mention he did not take the statin for a wk in [place] visiting his son a wk ago .Denies worsening depressive symptoms, suicidal ideation, or panic, though has ongoing anxiety, not increased recently.    Past Medical History  Diagnosis Date  . HYPERLIPIDEMIA 06/08/2007  . ANXIETY 06/08/2007  . DEPENDENCE, ALCOHOL NEC/NOS, IN REMISSION 06/08/2007  . DEPRESSION 06/08/2007  . HYPERTENSION 06/08/2007  . ALLERGIC RHINITIS 06/08/2007  . GERD 06/08/2007  . DIVERTICULOSIS, COLON 06/08/2007  . IBS 06/08/2007  . PILONIDAL CYST 06/08/2007  . DEGENERATION, CERVICAL DISC 07/30/2007  . Cervicalgia 12/05/2007  . LOW BACK PAIN, CHRONIC 08/12/2010  . PLANTAR FASCIITIS 06/08/2007  . FATIGUE 09/10/2008  . Headache 06/08/2007  . Cough 08/12/2010  . Nocturia 07/16/2009  . EPIGASTRIC TENDERNESS 12/05/2007  . COLONIC POLYPS, HX OF 06/08/2007   Past Surgical History  Procedure Date  . Pilonidal  cyst surgury   . Bladder and prostate surgury   . Cervical fusion     reports that he has quit smoking. He does not have any smokeless tobacco history on file. He reports that he does not drink alcohol or use illicit drugs. family history includes Heart disease in his mother and Kidney disease in his other. Allergies  Allergen Reactions  . Doxycycline Hyclate   . Sulfonamide Derivatives    Current Outpatient Prescriptions on File Prior to Visit  Medication Sig Dispense Refill  . amLODipine (NORVASC) 2.5 MG tablet Take 2.5 mg by mouth daily.        Marland Kitchen aspirin EC 81 MG EC tablet Take 81 mg by mouth daily.        . Glucosamine 500 MG TABS Take by mouth.        Marland Kitchen HYDROcodone-acetaminophen (NORCO) 10-325 MG per tablet Take 1 tablet by mouth every 12 (twelve) hours as needed.        . lansoprazole (PREVACID SOLUTAB) 30 MG disintegrating tablet Take 30 mg by mouth daily.        Marland Kitchen lisinopril (PRINIVIL,ZESTRIL) 40 MG tablet Take 40 mg by mouth daily.        . metoprolol (TOPROL-XL) 50 MG 24 hr tablet Take 50 mg by mouth. Take 1/2 tablet once daily       . Multiple Vitamin (MULTIVITAMIN) capsule Take 1 capsule by mouth daily.        Ailene Ards  3 340 MG CPDR Take by mouth daily.        . simvastatin (ZOCOR) 40 MG tablet Take 40 mg by mouth daily.         Review of Systems Review of Systems  Constitutional: Negative for diaphoresis and unexpected weight change.  HENT: Negative for drooling and tinnitus.   Eyes: Negative for photophobia and visual disturbance.  Respiratory: Negative for choking and stridor.   Gastrointestinal: Negative for vomiting and blood in stool.  Genitourinary: Negative for hematuria and decreased urine volume.  Musculoskeletal: Negative for gait problem.  Skin: Negative for color change and wound.  Neurological: Negative for tremors and numbness.  Psychiatric/Behavioral: Negative for decreased concentration. The patient is not hyperactive.       Objective:   Physical  Exam BP 150/90  Pulse 64  Temp(Src) 98.2 F (36.8 C) (Oral)  Ht 5\' 10"  (1.778 m)  Wt 194 lb 8 oz (88.225 kg)  BMI 27.91 kg/m2  SpO2 98% Physical Exam  VS noted Constitutional: Pt appears well-developed and well-nourished.  HENT: Head: Normocephalic.  Right Ear: External ear normal.  Left Ear: External ear normal.  Eyes: Conjunctivae and EOM are normal. Pupils are equal, round, and reactive to light.  Neck: Normal range of motion. Neck supple.  Cardiovascular: Normal rate and regular rhythm.   Pulmonary/Chest: Effort normal and breath sounds normal.  Abd:  Soft, NT, non-distended, + BS Neurological: Pt is alert. No cranial nerve deficit.  Skin: Skin is warm. No erythema.  Psychiatric: Pt behavior is normal. Thought content normal. 1+ nervous         Assessment & Plan:

## 2011-03-17 NOTE — Discharge Summary (Signed)
[facility]  Patient:    David Bolton, David Bolton                     MRN: [id number] Adm. Date:  [id number] Disc. Date: [id number] Attending:  Londell Moh                           Discharge Summary  ADMITTING DIAGNOSES: 1. Benign prostatic hypertrophy with bladder obstruction. 2. Interstitial cystitis with associated pelvic pain.  HISTORY:  This 69 year old male has unexplained problems with severe urinary urgency and frequency.  Flomax was not helpful but it was not felt that the patient had major prostatic enlargement, although bladder obstruction was certainly considered.  Patient has had high postvoid residuals and it is felt that he has some degree of dysfunctional voiding.  Patient is to be admitted following TURP/TUIP and hydrodistention to look for interstitial cystitis.  PAST MEDICAL HISTORY:  The patients past medical history is well-delineated in the initial history and physical.  MEDICATIONS:  It should be noted that the patients medications at the time of admission consisted of Flomax, Altace and vitamin D.  HOSPITAL COURSE:  The patient was taken to the operating room where he underwent cystoscopy which showed no bilobar hypertrophy but did show a somewhat tight bladder neck.  He underwent successful TUIP; he also had a hydrodistention and bladder biopsy which confirmed the diagnosis of interstitial cystitis.  Patients postoperative course was unremarkable and he was allowed to have a standard voiding trial, as would be expected following a bladder procedure, and he was able to urinate without much difficulty and he was sent home on April 24, 2001.  We note that the patient had the usual problems that one would expect after interstitial cystitis.  When we attempted a voiding trial on postoperative day #1, he had a high postvoid residual of 600 cc; after that, the patient did much better with voiding and was able to be discharged on  postoperative day #2.  DISCHARGE MEDICATIONS:  He was sent home with Lorcet Plus, Levaquin and Pyridium.  PLAN:  We will bring him for followup in one to two weeks for further evaluation to determine the appropriate therapy for his interstitial cystitis.  PROCEDURES DURING THIS HOSPITAL STAY:  Cystoscopy, transurethral incision of the prostate, hydrodistention and bladder biopsy. DD:  05/15/01 TD:  05/16/01 Job: [id number]

## 2011-03-17 NOTE — Op Note (Signed)
[facility]  Patient:    David Bolton, David Bolton                     MRN: [id number] Proc. Date: 04/23/01 Adm. Date:  [id number] Attending:  Londell Moh CC:         Corwin Levins, M.D. [facility]   Operative Report  SERVICE:  Urology.  PREOPERATIVE DIAGNOSES: 1. Bladder outlet obstruction. 2. Possible interstitial cystitis.  POSTOPERATIVE DIAGNOSES: 1. Bladder outlet obstruction. 2. Interstitial cystitis.  PROCEDURE:  Cystoscopy, transurethral incision of the prostate, hydrodistention of the bladder with bladder biopsies.  SURGEON:  Dr. Logan Bores.  ANESTHESIA:  General.  COMPLICATIONS:  None.  DRAINS:  A 24 French Foley catheter.  BRIEF HISTORY:  This 70 year old male has had problems with severe urinary urgency and frequency. He is not felt to have significant lateral lobe hypertrophy but does have a high and tight bladder neck that may be partially obstructing this and he has many symptoms of frequency and pain consistent with possible interstitial cystitis. He is now to undergo cystoscopy and hydrodistention with TUIP and/or TURP if appropriate. He understands the risks and benefits of the procedure and gave full and informed consent.  DESCRIPTION OF PROCEDURE:  After successful induction of general anesthesia, the patient was placed in the dorsal lithotomy position, prepped with Betadine and draped in the usual sterile fashion. Cystoscopy was performed, the urethra was visualized in its entirety and found to be normal. Beyond the verumontanum, there was not a lot of lateral lobe hypertrophy but there was a high and tight bladder neck. The bladder was carefully inspected and was free of any tumor or stone. Both ureteral orifices were normal in configuration and location. The patient was found to have a bladder capacity of 700 cc. He had glomerulations and this was felt to be consistent with IC, a bladder biopsy was taken, the biopsy site was  cauterized. The patient has the urethra dilated up to 30 [place] and the resectoscope was inserted. It was not felt that a TURP was appropriate because there was very little lateral lobe hypertrophy but a TUIP was done in order to completely open up the bladder neck. The patient had cauterization performed to make sure that there was no bleeding. A 24 French Foley catheter was inserted to place a three way bladder irrigation. The patient was given a B&O suppository intraoperatively, and he was taken to the recovery room in good condition. DD:  04/23/01 TD:  04/24/01 Job: [id number]

## 2011-03-17 NOTE — H&P (Signed)
[facility]  Patient:    David Bolton, David Bolton                     MRN: [id number] Adm. Date:  [id number] Disc. Date: [id number] Attending:  Londell Moh                         History and Physical  ADMITTING DIAGNOSES: 1. Bladder neck obstruction. 2. Interstitial cystitis.  HISTORY: DD:  05/15/01 TD:  05/16/01 Job: [id number]

## 2011-03-17 NOTE — H&P (Signed)
[facility]  Patient:    David Bolton, David Bolton                     MRN: [id number] Adm. Date:  [id number] Attending:  Londell Moh                         History and Physical  ADMITTING DIAGNOSES: 1. Chronic urgency and frequency. 2. Bladder pain, possible interstitial cystitis.  SECONDARY DIAGNOSIS:  Hypertension.  HISTORY OF PRESENT ILLNESS:  This 70 year old male has unexplained problems with urinary urgency and frequency.  The patient has not responded well to Flomax, but his prostate is small and it is not felt that he has significant bladder outlet obstruction.  The patient has episodes of elevated postvoid residuals that are somewhat difficult to explain.  It is thought he may have some functional voiding issues, perhaps secondary to interstitial cystitis. The patient will be admitted following TURP and hydrodistention.  PAST MEDICAL HISTORY:  Remarkable for an anterior cervical fusion done 10 years ago, rotator cuff surgery two years ago, and following pain in the right forearm for a fracture four years ago.  The patient had a Cardiolite study in November of last year that was normal.  He is known to have hypertension.  He has also had some decreased sensation in the hands and feet thought to be due to the abnormalities in his neck.  The patients past medical history is otherwise unremarkable.  MEDICATIONS:  Flomax, Altace, and vitamin D.  SOCIAL HISTORY:  The patient denies tobacco or alcohol.  Family history, social history, and review of systems are also delineated in the office chart that was placed on the patients chart.  PHYSICAL EXAMINATION:  VITAL SIGNS:  Temperature 98.1, pulse 72, respirations 18, blood pressure 140/90.  GENERAL:  He is a well-developed, well-nourished male in no acute distress.  HEENT:  Normocephalic, atraumatic.  Cranial nerves II-XII were grossly intact.  NECK:  Supple with no adenopathy or  thyromegaly.  CHEST:  Lungs were clear.  CARDIAC:  Heart had a regular rate and rhythm, no murmurs, thrills, gallops, rubs, heaves.  ABDOMEN:  Soft and nontender with no palpable mass, rebound, or guarding.  GENITOURINARY:  2+ BPH.  Testicles normal size, shape, and consistency.  No hydroceles, masses, varicocele, or hernia.  EXTREMITIES:  No clubbing, cyanosis, or edema.  NEUROLOGIC:  Appears grossly intact.  IMPRESSION:  Benign prostatic hypertrophy and bladder outlet obstruction.  PLAN:  Admit following a TURP as well as hydrodistention. DD:  04/23/01 TD:  04/24/01 Job: [id number]

## 2011-05-31 ENCOUNTER — Other Ambulatory Visit: Payer: Self-pay

## 2011-05-31 MED ORDER — HYDROCODONE-ACETAMINOPHEN 10-325 MG PO TABS: 1.0000 | ORAL_TABLET | Freq: Two times a day (BID) | ORAL | Status: DC | PRN

## 2011-05-31 NOTE — Telephone Encounter (Signed)
Faxed hardcopy to [employer] [phone]

## 2011-06-06 ENCOUNTER — Encounter: Payer: Self-pay | Admitting: Internal Medicine

## 2011-06-06 ENCOUNTER — Ambulatory Visit (INDEPENDENT_AMBULATORY_CARE_PROVIDER_SITE_OTHER): Payer: Medicare Other | Admitting: Internal Medicine

## 2011-06-06 VITALS — BP 142/80 | HR 64 | Temp 97.7°F | Ht 70.0 in | Wt 196.4 lb

## 2011-06-06 DIAGNOSIS — J309 Allergic rhinitis, unspecified: Secondary | ICD-10-CM

## 2011-06-06 DIAGNOSIS — I10 Essential (primary) hypertension: Secondary | ICD-10-CM

## 2011-06-06 DIAGNOSIS — J029 Acute pharyngitis, unspecified: Secondary | ICD-10-CM

## 2011-06-06 MED ORDER — LEVOFLOXACIN 250 MG PO TABS: 250.0000 mg | ORAL_TABLET | Freq: Every day | ORAL | Status: AC

## 2011-06-06 NOTE — Patient Instructions (Addendum)
Take all new medications as prescribed Continue all other medications as before  

## 2011-06-06 NOTE — Assessment & Plan Note (Signed)
stable overall by hx and exam, most recent data reviewed with pt, and pt to continue medical treatment as before  BP Readings from Last 3 Encounters:  06/06/11 142/80  02/10/11 150/90  08/12/10 140/98

## 2011-06-06 NOTE — Assessment & Plan Note (Signed)
Mild to mod, for antibx course,  to f/u any worsening symptoms or concerns 

## 2011-06-06 NOTE — Assessment & Plan Note (Signed)
stable overall by hx and exam, , and pt to continue medical treatment as before   

## 2011-06-06 NOTE — Progress Notes (Signed)
Subjective:    Patient ID: David Bolton, male    DOB: 07/13/1941, 69 y.o.   MRN: [id number]  HPI   Here with 3 days acute onset fever, general weakness and malaise,  with severe ST, but little to no cough and Pt denies chest pain, increased sob or doe, wheezing, orthopnea, PND, increased LE swelling, palpitations, dizziness or syncope. Pt denies new neurological symptoms such as new headache, or facial or extremity weakness or numbness   Pt denies polydipsia, polyuria. Does have several wks ongoing nasal allergy symptoms with clear congestion, itch and sneeze, without fever, pain, ST, cough or wheezing, but better with current meds. Past Medical History  Diagnosis Date  . HYPERLIPIDEMIA 06/08/2007  . ANXIETY 06/08/2007  . DEPENDENCE, ALCOHOL NEC/NOS, IN REMISSION 06/08/2007  . DEPRESSION 06/08/2007  . HYPERTENSION 06/08/2007  . ALLERGIC RHINITIS 06/08/2007  . GERD 06/08/2007  . DIVERTICULOSIS, COLON 06/08/2007  . IBS 06/08/2007  . PILONIDAL CYST 06/08/2007  . DEGENERATION, CERVICAL DISC 07/30/2007  . Cervicalgia 12/05/2007  . LOW BACK PAIN, CHRONIC 08/12/2010  . PLANTAR FASCIITIS 06/08/2007  . FATIGUE 09/10/2008  . Headache 06/08/2007  . Cough 08/12/2010  . Nocturia 07/16/2009  . EPIGASTRIC TENDERNESS 12/05/2007  . COLONIC POLYPS, HX OF 06/08/2007  . Impaired glucose tolerance 02/10/2011   Past Surgical History  Procedure Date  . Pilonidal cyst surgury   . Bladder and prostate surgury   . Cervical fusion     reports that he has quit smoking. He does not have any smokeless tobacco history on file. He reports that he does not drink alcohol or use illicit drugs. family history includes Heart disease in his mother and Kidney disease in his other. Allergies  Allergen Reactions  . Doxycycline Hyclate   . Sulfonamide Derivatives    Current Outpatient Prescriptions on File Prior to Visit  Medication Sig Dispense Refill  . amLODipine (NORVASC) 2.5 MG tablet Take 2.5 mg by mouth daily.        Marland Kitchen aspirin EC 81  MG EC tablet Take 81 mg by mouth daily.        . Glucosamine 500 MG TABS Take by mouth.        Marland Kitchen HYDROcodone-acetaminophen (NORCO) 10-325 MG per tablet Take 1 tablet by mouth every 12 (twelve) hours as needed.  60 tablet  3  . lansoprazole (PREVACID SOLUTAB) 30 MG disintegrating tablet Take 30 mg by mouth daily.        Marland Kitchen lisinopril (PRINIVIL,ZESTRIL) 40 MG tablet Take 40 mg by mouth daily.        . metoprolol (TOPROL-XL) 50 MG 24 hr tablet Take 50 mg by mouth. Take 1/2 tablet once daily       . Multiple Vitamin (MULTIVITAMIN) capsule Take 1 capsule by mouth daily.        . OMEGA 3 340 MG CPDR Take by mouth daily.        . simvastatin (ZOCOR) 40 MG tablet Take 1 tablet (40 mg total) by mouth daily.  90 tablet  3   Review of Systems Review of Systems  Constitutional: Negative for diaphoresis and unexpected weight change.  HENT: Negative for drooling and tinnitus.   Eyes: Negative for photophobia and visual disturbance.  Respiratory: Negative for choking and stridor.       Objective:   Physical Exam BP 142/80  Pulse 64  Temp(Src) 97.7 F (36.5 C) (Oral)  Ht 5\' 10"  (1.778 m)  Wt 196 lb 6.4 oz (89.086 kg)  BMI 28.18  kg/m2  SpO2 97% Physical Exam  VS noted Constitutional: Pt appears well-developed and well-nourished.  HENT: Head: Normocephalic.  Right Ear: External ear normal.  Left Ear: External ear normal.  Bilat tm's mild erythema.  Sinus nontender.  Pharynx severe erythema with mild exudate Eyes: Conjunctivae and EOM are normal. Pupils are equal, round, and reactive to light.  Neck: Normal range of motion. Neck supple.  Cardiovascular: Normal rate and regular rhythm.   Pulmonary/Chest: Effort normal and breath sounds normal.  Neurological: Pt is alert. No cranial nerve deficit.  Skin: Skin is warm. No erythema.  Psychiatric: Pt behavior is normal. Thought content normal.         Assessment & Plan:

## 2011-08-14 ENCOUNTER — Other Ambulatory Visit (INDEPENDENT_AMBULATORY_CARE_PROVIDER_SITE_OTHER): Payer: Medicare Other

## 2011-08-14 ENCOUNTER — Encounter: Payer: Self-pay | Admitting: Internal Medicine

## 2011-08-14 ENCOUNTER — Ambulatory Visit (INDEPENDENT_AMBULATORY_CARE_PROVIDER_SITE_OTHER): Payer: Medicare Other | Admitting: Internal Medicine

## 2011-08-14 VITALS — BP 138/88 | HR 66 | Temp 98.0°F | Ht 70.0 in | Wt 198.1 lb

## 2011-08-14 DIAGNOSIS — E785 Hyperlipidemia, unspecified: Secondary | ICD-10-CM

## 2011-08-14 DIAGNOSIS — N320 Bladder-neck obstruction: Secondary | ICD-10-CM | POA: Insufficient documentation

## 2011-08-14 DIAGNOSIS — Z0000 Encounter for general adult medical examination without abnormal findings: Secondary | ICD-10-CM | POA: Insufficient documentation

## 2011-08-14 DIAGNOSIS — R5381 Other malaise: Secondary | ICD-10-CM

## 2011-08-14 DIAGNOSIS — R7309 Other abnormal glucose: Secondary | ICD-10-CM

## 2011-08-14 DIAGNOSIS — R7302 Impaired glucose tolerance (oral): Secondary | ICD-10-CM

## 2011-08-14 DIAGNOSIS — Z23 Encounter for immunization: Secondary | ICD-10-CM

## 2011-08-14 DIAGNOSIS — I10 Essential (primary) hypertension: Secondary | ICD-10-CM

## 2011-08-14 DIAGNOSIS — R5383 Other fatigue: Secondary | ICD-10-CM

## 2011-08-14 DIAGNOSIS — Z0001 Encounter for general adult medical examination with abnormal findings: Secondary | ICD-10-CM | POA: Insufficient documentation

## 2011-08-14 LAB — BASIC METABOLIC PANEL
CO2: 26 mEq/L (ref 19–32)
Calcium: 9.5 mg/dL (ref 8.4–10.5)
Chloride: 104 mEq/L (ref 96–112)
Glucose, Bld: 148 mg/dL — ABNORMAL HIGH (ref 70–99)
Potassium: 4.9 mEq/L (ref 3.5–5.1)
Sodium: 138 mEq/L (ref 135–145)

## 2011-08-14 LAB — URINALYSIS, ROUTINE W REFLEX MICROSCOPIC
Bilirubin Urine: NEGATIVE
Hgb urine dipstick: NEGATIVE
Ketones, ur: NEGATIVE
Leukocytes, UA: NEGATIVE
Nitrite: NEGATIVE
Urobilinogen, UA: 0.2 (ref 0.0–1.0)
pH: 6 (ref 5.0–8.0)

## 2011-08-14 LAB — TSH: TSH: 1.73 u[IU]/mL (ref 0.35–5.50)

## 2011-08-14 LAB — CBC WITH DIFFERENTIAL/PLATELET
Basophils Absolute: 0 10*3/uL (ref 0.0–0.1)
Eosinophils Absolute: 0.1 10*3/uL (ref 0.0–0.7)
Lymphocytes Relative: 31.4 % (ref 12.0–46.0)
MCHC: 34 g/dL (ref 30.0–36.0)
Monocytes Relative: 9.8 % (ref 3.0–12.0)
Neutrophils Relative %: 56.1 % (ref 43.0–77.0)
Platelets: 238 10*3/uL (ref 150.0–400.0)
RDW: 12.7 % (ref 11.5–14.6)

## 2011-08-14 LAB — LIPID PANEL
HDL: 56.7 mg/dL (ref >39.00)
LDL Cholesterol: 74 mg/dL (ref 0–99)
Total CHOL/HDL Ratio: 3
Triglycerides: 109 mg/dL (ref 0.0–149.0)
VLDL: 21.8 mg/dL (ref 0.0–40.0)

## 2011-08-14 LAB — HEPATIC FUNCTION PANEL
ALT: 28 U/L (ref 0–53)
AST: 35 U/L (ref 0–37)
Albumin: 4.2 g/dL (ref 3.5–5.2)
Alkaline Phosphatase: 54 U/L (ref 39–117)
Total Protein: 7.5 g/dL (ref 6.0–8.3)

## 2011-08-14 MED ORDER — ATORVASTATIN CALCIUM 20 MG PO TABS: 20.0000 mg | ORAL_TABLET | Freq: Every day | ORAL | Status: DC

## 2011-08-14 NOTE — Assessment & Plan Note (Signed)
stable overall by hx and exam, most recent data reviewed with pt, and pt to continue medical treatment as before  Lab Results  Component Value Date   LDLCALC 82 07/16/2009

## 2011-08-14 NOTE — Assessment & Plan Note (Signed)
asympt - to also check ua and psa,  to f/u any worsening symptoms or concerns

## 2011-08-14 NOTE — Patient Instructions (Addendum)
You had the flu shot today Continue all other medications as before Please go to LAB in the Basement for the blood and/or urine tests to be done today Please call the phone number [phone] (the [employer] System) for results of testing in 2-3 days;  When calling, simply dial the number, and when prompted enter the MRN number above (the Medical Record Number) and the # key, then the message should start. Stop the simvastatin when the current bottle is done Start the lipitor at 20 mg per day after that Please return in 6 months, or sooner if needed

## 2011-08-14 NOTE — Progress Notes (Signed)
Subjective:    Patient ID: David Bolton, male    DOB: Dec 07, 1940, 70 y.o.   MRN: [id number]  HPI  Here to f/u; overall doing ok,  Pt denies chest pain, increased sob or doe, wheezing, orthopnea, PND, increased LE swelling, palpitations, dizziness or syncope.  Pt denies new neurological symptoms such as new headache, or facial or extremity weakness or numbness   Pt denies polydipsia, polyuria, or low sugar symptoms such as weakness or confusion improved with po intake.  Pt states overall good compliance with meds, trying to follow lower cholesterol, diabetic diet, wt overall stable but little exercise however. Does admit to some occasional dietary indiscretion, but is taking  meds better recently.  Does have sense of ongoing fatigue, but denies signficant hypersomnolence.   Past Medical History  Diagnosis Date  . HYPERLIPIDEMIA 06/08/2007  . ANXIETY 06/08/2007  . DEPENDENCE, ALCOHOL NEC/NOS, IN REMISSION 06/08/2007  . DEPRESSION 06/08/2007  . HYPERTENSION 06/08/2007  . ALLERGIC RHINITIS 06/08/2007  . GERD 06/08/2007  . DIVERTICULOSIS, COLON 06/08/2007  . IBS 06/08/2007  . PILONIDAL CYST 06/08/2007  . DEGENERATION, CERVICAL DISC 07/30/2007  . Cervicalgia 12/05/2007  . LOW BACK PAIN, CHRONIC 08/12/2010  . PLANTAR FASCIITIS 06/08/2007  . FATIGUE 09/10/2008  . Headache 06/08/2007  . Cough 08/12/2010  . Nocturia 07/16/2009  . EPIGASTRIC TENDERNESS 12/05/2007  . COLONIC POLYPS, HX OF 06/08/2007  . Impaired glucose tolerance 02/10/2011   Past Surgical History  Procedure Date  . Pilonidal cyst surgury   . Bladder and prostate surgury   . Cervical fusion     reports that he has never smoked. He does not have any smokeless tobacco history on file. He reports that he does not drink alcohol or use illicit drugs. family history includes Heart disease in his mother and Kidney disease in his other. Allergies  Allergen Reactions  . Doxycycline Hyclate   . Sulfonamide Derivatives    Current Outpatient Prescriptions on  File Prior to Visit  Medication Sig Dispense Refill  . amLODipine (NORVASC) 2.5 MG tablet Take 2.5 mg by mouth daily.        Marland Kitchen aspirin EC 81 MG EC tablet Take 81 mg by mouth daily.        . Glucosamine 500 MG TABS Take by mouth.        Marland Kitchen HYDROcodone-acetaminophen (NORCO) 10-325 MG per tablet Take 1 tablet by mouth every 12 (twelve) hours as needed.  60 tablet  3  . lansoprazole (PREVACID SOLUTAB) 30 MG disintegrating tablet Take 30 mg by mouth daily.        Marland Kitchen lisinopril (PRINIVIL,ZESTRIL) 40 MG tablet Take 40 mg by mouth daily.        . metoprolol (TOPROL-XL) 50 MG 24 hr tablet Take 50 mg by mouth. Take 1/2 tablet once daily       . Multiple Vitamin (MULTIVITAMIN) capsule Take 1 capsule by mouth daily.        . OMEGA 3 340 MG CPDR Take by mouth daily.         Review of Systems Review of Systems  Constitutional: Negative for diaphoresis and unexpected weight change.  HENT: Negative for drooling and tinnitus.   Eyes: Negative for photophobia and visual disturbance.  Respiratory: Negative for choking and stridor.   Gastrointestinal: Negative for vomiting and blood in stool.  Genitourinary: Negative for hematuria and decreased urine volume.  Musculoskeletal: Negative for gait problem.  Skin: Negative for color change and wound.  Neurological: Negative for tremors and  numbness.  Psychiatric/Behavioral: Negative for decreased concentration. The patient is not hyperactive.      Objective:   Physical Exam BP 138/88  Pulse 66  Temp(Src) 98 F (36.7 C) (Oral)  Ht 5\' 10"  (1.778 m)  Wt 198 lb 1.3 oz (89.848 kg)  BMI 28.42 kg/m2  SpO2 96% Physical Exam  VS noted Constitutional: Pt appears well-developed and well-nourished.  HENT: Head: Normocephalic.  Right Ear: External ear normal.  Left Ear: External ear normal.  Eyes: Conjunctivae and EOM are normal. Pupils are equal, round, and reactive to light.  Neck: Normal range of motion. Neck supple.  Cardiovascular: Normal rate and regular  rhythm.   Pulmonary/Chest: Effort normal and breath sounds normal.  Abd:  Soft, NT, non-distended, + BS Neurological: Pt is alert. No cranial nerve deficit.  Skin: Skin is warm. No erythema.  Psychiatric: Pt behavior is normal. Thought content normal.     Assessment & Plan:

## 2011-08-14 NOTE — Assessment & Plan Note (Signed)
stable overall by hx and exam, most recent data reviewed with pt, and pt to continue medical treatment as before  BP Readings from Last 3 Encounters:  08/14/11 138/88  06/06/11 142/80  02/10/11 150/90

## 2011-08-14 NOTE — Assessment & Plan Note (Signed)
stable overall by hx and exam, most recent data reviewed with pt, and pt to continue medical treatment as before  Lab Results  Component Value Date   HGBA1C 6.4 02/10/2011    

## 2011-08-14 NOTE — Assessment & Plan Note (Signed)
Etiology unclear, Exam otherwise benign, to check labs as documented, follow with expectant management  

## 2011-09-06 ENCOUNTER — Other Ambulatory Visit: Payer: Self-pay | Admitting: Internal Medicine

## 2012-01-23 ENCOUNTER — Other Ambulatory Visit: Payer: Self-pay

## 2012-01-23 MED ORDER — HYDROCODONE-ACETAMINOPHEN 10-325 MG PO TABS: 1.0000 | ORAL_TABLET | Freq: Two times a day (BID) | ORAL | Status: DC | PRN

## 2012-01-23 NOTE — Telephone Encounter (Signed)
Done hardcopy to [facility]  

## 2012-01-24 NOTE — Telephone Encounter (Signed)
Faxed hardcopy to pharmacy. 

## 2012-02-01 ENCOUNTER — Encounter: Payer: Self-pay | Admitting: Internal Medicine

## 2012-02-01 ENCOUNTER — Ambulatory Visit (INDEPENDENT_AMBULATORY_CARE_PROVIDER_SITE_OTHER): Payer: Medicare Other | Admitting: Internal Medicine

## 2012-02-01 VITALS — BP 152/80 | HR 75 | Temp 97.6°F | Ht 70.0 in | Wt 194.0 lb

## 2012-02-01 DIAGNOSIS — R7309 Other abnormal glucose: Secondary | ICD-10-CM

## 2012-02-01 DIAGNOSIS — I10 Essential (primary) hypertension: Secondary | ICD-10-CM | POA: Diagnosis not present

## 2012-02-01 DIAGNOSIS — E785 Hyperlipidemia, unspecified: Secondary | ICD-10-CM

## 2012-02-01 DIAGNOSIS — J069 Acute upper respiratory infection, unspecified: Secondary | ICD-10-CM | POA: Diagnosis not present

## 2012-02-01 DIAGNOSIS — R7302 Impaired glucose tolerance (oral): Secondary | ICD-10-CM

## 2012-02-01 MED ORDER — AMOXICILLIN 500 MG PO CAPS: ORAL_CAPSULE | ORAL | Status: DC

## 2012-02-01 MED ORDER — METOPROLOL SUCCINATE ER 50 MG PO TB24: 50.0000 mg | ORAL_TABLET | Freq: Every day | ORAL | Status: DC

## 2012-02-01 MED ORDER — ATORVASTATIN CALCIUM 20 MG PO TABS: 20.0000 mg | ORAL_TABLET | Freq: Every day | ORAL | Status: DC

## 2012-02-01 NOTE — Patient Instructions (Signed)
Take all new medications as prescribed Continue all other medications as before; your refills were sent as you requested today No blood work needed today You can also take Delsym OTC for cough, and/or Mucinex (or it's generic off brand) for congestion Please return in 6 months, or sooner if needed

## 2012-02-04 ENCOUNTER — Encounter: Payer: Self-pay | Admitting: Internal Medicine

## 2012-02-04 NOTE — Progress Notes (Signed)
Subjective:    Patient ID: David Bolton, male    DOB: Aug 16, 1941, 71 y.o.   MRN: [id number]  HPI   Here with 3 days acute onset fever, facial discomfort, pressure, general weakness and malaise, and greenish d/c, with mild to mod ST, but little to no cough and Pt denies chest pain, increased sob or doe, wheezing, orthopnea, PND, increased LE swelling, palpitations, dizziness or syncope.  Pt denies new neurological symptoms such as new headache, or facial or extremity weakness or numbness   Pt denies polydipsia, polyuria,   Pt states overall good compliance with meds, trying to follow lower cholesterol, diabetic diet, wt overall stable.  Denies worsening depressive symptoms, suicidal ideation, or panic, though has ongoing anxiety, not increased recently.  Past Medical History  Diagnosis Date  . HYPERLIPIDEMIA 06/08/2007  . ANXIETY 06/08/2007  . DEPENDENCE, ALCOHOL NEC/NOS, IN REMISSION 06/08/2007  . DEPRESSION 06/08/2007  . HYPERTENSION 06/08/2007  . ALLERGIC RHINITIS 06/08/2007  . GERD 06/08/2007  . DIVERTICULOSIS, COLON 06/08/2007  . IBS 06/08/2007  . PILONIDAL CYST 06/08/2007  . DEGENERATION, CERVICAL DISC 07/30/2007  . Cervicalgia 12/05/2007  . LOW BACK PAIN, CHRONIC 08/12/2010  . PLANTAR FASCIITIS 06/08/2007  . FATIGUE 09/10/2008  . Headache 06/08/2007  . Cough 08/12/2010  . Nocturia 07/16/2009  . EPIGASTRIC TENDERNESS 12/05/2007  . COLONIC POLYPS, HX OF 06/08/2007  . Impaired glucose tolerance 02/10/2011   Past Surgical History  Procedure Date  . Pilonidal cyst surgury   . Bladder and prostate surgury   . Cervical fusion     reports that he has never smoked. He does not have any smokeless tobacco history on file. He reports that he does not drink alcohol or use illicit drugs. family history includes Heart disease in his mother and Kidney disease in his other. Allergies  Allergen Reactions  . Doxycycline Hyclate   . Sulfonamide Derivatives    Current Outpatient Prescriptions on File Prior to Visit    Medication Sig Dispense Refill  . amLODipine (NORVASC) 2.5 MG tablet Take 2.5 mg by mouth daily.        Marland Kitchen aspirin EC 81 MG EC tablet Take 81 mg by mouth daily.        Marland Kitchen atorvastatin (LIPITOR) 20 MG tablet Take 1 tablet (20 mg total) by mouth daily.  90 tablet  3  . Glucosamine 500 MG TABS Take by mouth.        Marland Kitchen HYDROcodone-acetaminophen (NORCO) 10-325 MG per tablet Take 1 tablet by mouth every 12 (twelve) hours as needed.  60 tablet  3  . lansoprazole (PREVACID SOLUTAB) 30 MG disintegrating tablet Take 30 mg by mouth daily.        Marland Kitchen lisinopril (PRINIVIL,ZESTRIL) 40 MG tablet TAKE 1 TABLET BY MOUTH EVERY DAY  90 tablet  3  . metoprolol succinate (TOPROL-XL) 50 MG 24 hr tablet Take 1 tablet (50 mg total) by mouth daily. Take 1/2 tablet once daily  90 tablet  3  . Multiple Vitamin (MULTIVITAMIN) capsule Take 1 capsule by mouth daily.        . OMEGA 3 340 MG CPDR Take by mouth daily.         Review of Systems Review of Systems  Constitutional: Negative for diaphoresis and unexpected weight change.  Eyes: Negative for photophobia and visual disturbance.  Respiratory: Negative for choking and stridor.   Gastrointestinal: Negative for vomiting and blood in stool.  Genitourinary: Negative for hematuria and decreased urine volume.  Musculoskeletal: Negative for gait  problem.     Objective:   Physical Exam BP 152/80  Pulse 75  Temp(Src) 97.6 F (36.4 C) (Oral)  Ht 5\' 10"  (1.778 m)  Wt 194 lb (87.998 kg)  BMI 27.84 kg/m2  SpO2 98% Physical Exam  VS noted, mild ill Constitutional: Pt appears well-developed and well-nourished.  HENT: Head: Normocephalic.  Right Ear: External ear normal.  Left Ear: External ear normal.  Bilat tm's mild erythema.  Sinus nontender.  Pharynx mod erythema Eyes: Conjunctivae and EOM are normal. Pupils are equal, round, and reactive to light.  Neck: Normal range of motion. Neck supple.  Cardiovascular: Normal rate and regular rhythm.   Pulmonary/Chest: Effort  normal and breath sounds normal.  Neurological: Pt is alert Skin: Skin is warm. No erythema.  Psychiatric: Pt behavior is normal. Thought content normal.     Assessment & Plan:

## 2012-02-04 NOTE — Assessment & Plan Note (Signed)
stable overall by hx and exam, most recent data reviewed with pt, and pt to continue medical treatment as before - most likely elev today due to illness  BP Readings from Last 3 Encounters:  02/01/12 152/80  08/14/11 138/88  06/06/11 142/80

## 2012-02-04 NOTE — Assessment & Plan Note (Signed)
Mild to mod, for antibx course,  to f/u any worsening symptoms or concerns 

## 2012-02-04 NOTE — Assessment & Plan Note (Signed)
stable overall by hx and exam, most recent data reviewed with pt, and pt to continue medical treatment as before Lab Results  Component Value Date   LDLCALC 74 08/14/2011    

## 2012-02-04 NOTE — Assessment & Plan Note (Signed)
stable overall by hx and exam, most recent data reviewed with pt, and pt to continue medical treatment as before  Lab Results  Component Value Date   HGBA1C 6.5 08/14/2011

## 2012-08-02 ENCOUNTER — Ambulatory Visit (INDEPENDENT_AMBULATORY_CARE_PROVIDER_SITE_OTHER): Payer: Medicare Other | Admitting: Internal Medicine

## 2012-08-02 ENCOUNTER — Other Ambulatory Visit (INDEPENDENT_AMBULATORY_CARE_PROVIDER_SITE_OTHER): Payer: Medicare Other

## 2012-08-02 ENCOUNTER — Encounter: Payer: Self-pay | Admitting: Internal Medicine

## 2012-08-02 VITALS — BP 142/90 | HR 61 | Temp 98.3°F | Ht 70.0 in | Wt 199.0 lb

## 2012-08-02 DIAGNOSIS — R7302 Impaired glucose tolerance (oral): Secondary | ICD-10-CM

## 2012-08-02 DIAGNOSIS — R7309 Other abnormal glucose: Secondary | ICD-10-CM

## 2012-08-02 DIAGNOSIS — N320 Bladder-neck obstruction: Secondary | ICD-10-CM

## 2012-08-02 DIAGNOSIS — Z23 Encounter for immunization: Secondary | ICD-10-CM | POA: Diagnosis not present

## 2012-08-02 DIAGNOSIS — E785 Hyperlipidemia, unspecified: Secondary | ICD-10-CM

## 2012-08-02 DIAGNOSIS — I10 Essential (primary) hypertension: Secondary | ICD-10-CM

## 2012-08-02 DIAGNOSIS — F329 Major depressive disorder, single episode, unspecified: Secondary | ICD-10-CM

## 2012-08-02 LAB — URINALYSIS, ROUTINE W REFLEX MICROSCOPIC
Bilirubin Urine: NEGATIVE
Hgb urine dipstick: NEGATIVE
Ketones, ur: NEGATIVE
Total Protein, Urine: NEGATIVE
Urine Glucose: NEGATIVE
pH: 6 (ref 5.0–8.0)

## 2012-08-02 LAB — LIPID PANEL
HDL: 52.5 mg/dL (ref >39.00)
Triglycerides: 86 mg/dL (ref 0.0–149.0)
VLDL: 17.2 mg/dL (ref 0.0–40.0)

## 2012-08-02 LAB — CBC WITH DIFFERENTIAL/PLATELET
Basophils Absolute: 0 10*3/uL (ref 0.0–0.1)
Basophils Relative: 0.7 % (ref 0.0–3.0)
Eosinophils Relative: 2.6 % (ref 0.0–5.0)
Hemoglobin: 14.9 g/dL (ref 13.0–17.0)
Lymphocytes Relative: 33.4 % (ref 12.0–46.0)
Monocytes Relative: 10.2 % (ref 3.0–12.0)
Neutro Abs: 3 10*3/uL (ref 1.4–7.7)
RBC: 4.86 Mil/uL (ref 4.22–5.81)
WBC: 5.7 10*3/uL (ref 4.5–10.5)

## 2012-08-02 LAB — BASIC METABOLIC PANEL
CO2: 25 mEq/L (ref 19–32)
Calcium: 9.2 mg/dL (ref 8.4–10.5)
Creatinine, Ser: 0.8 mg/dL (ref 0.4–1.5)
Glucose, Bld: 141 mg/dL — ABNORMAL HIGH (ref 70–99)

## 2012-08-02 LAB — HEPATIC FUNCTION PANEL
Albumin: 4.2 g/dL (ref 3.5–5.2)
Total Protein: 7.6 g/dL (ref 6.0–8.3)

## 2012-08-02 LAB — HEMOGLOBIN A1C: Hgb A1c MFr Bld: 6.7 % — ABNORMAL HIGH (ref 4.6–6.5)

## 2012-08-02 MED ORDER — HYDROCODONE-ACETAMINOPHEN 10-325 MG PO TABS: 1.0000 | ORAL_TABLET | Freq: Two times a day (BID) | ORAL | Status: DC | PRN

## 2012-08-02 NOTE — Assessment & Plan Note (Signed)
Also for psa as he is due, asymptomatic,  to f/u any worsening symptoms or concerns

## 2012-08-02 NOTE — Assessment & Plan Note (Signed)
stable overall by hx and exam, most recent data reviewed with pt, and pt to continue medical treatment as before Lab Results  Component Value Date   LDLCALC 74 08/14/2011

## 2012-08-02 NOTE — Progress Notes (Signed)
Subjective:    Patient ID: David Bolton, male    DOB: 12-May-1941, 71 y.o.   MRN: [id number]  HPI  Here for f/u;  Overall doing ok;  Pt denies CP, worsening SOB, DOE, wheezing, orthopnea, PND, worsening LE edema, palpitations, dizziness or syncope.  Pt denies neurological change such as new Headache, facial or extremity weakness.  Pt denies polydipsia, polyuria, or low sugar symptoms. Pt states overall good compliance with treatment and medications, good tolerability, and trying to follow lower cholesterol diet.  Pt denies worsening depressive symptoms, suicidal ideation or panic. No fever, wt loss, night sweats, loss of appetite, or other constitutional symptoms.  Pt states good ability with ADL's, low fall risk, home safety reviewed and adequate, no significant changes in hearing or vision, and occasionally active with exercise.  Takes MVI daily, drinks plenty of fluids per day, trying to watch the diet. Wt is up a few lbs to 199 now, has occas too many sweets.  Does take his BP with mild elev SBP in the am to first get up, but then better controlled with meds and days such as working in the yard the sbp is more like 112.   Overall pain ok, needs pain med refill Past Medical History  Diagnosis Date  . HYPERLIPIDEMIA 06/08/2007  . ANXIETY 06/08/2007  . DEPENDENCE, ALCOHOL NEC/NOS, IN REMISSION 06/08/2007  . DEPRESSION 06/08/2007  . HYPERTENSION 06/08/2007  . ALLERGIC RHINITIS 06/08/2007  . GERD 06/08/2007  . DIVERTICULOSIS, COLON 06/08/2007  . IBS 06/08/2007  . PILONIDAL CYST 06/08/2007  . DEGENERATION, CERVICAL DISC 07/30/2007  . Cervicalgia 12/05/2007  . LOW BACK PAIN, CHRONIC 08/12/2010  . PLANTAR FASCIITIS 06/08/2007  . FATIGUE 09/10/2008  . Headache 06/08/2007  . Cough 08/12/2010  . Nocturia 07/16/2009  . EPIGASTRIC TENDERNESS 12/05/2007  . COLONIC POLYPS, HX OF 06/08/2007  . Impaired glucose tolerance 02/10/2011   Past Surgical History  Procedure Date  . Pilonidal cyst surgury   . Bladder and prostate  surgury   . Cervical fusion     reports that he has never smoked. He does not have any smokeless tobacco history on file. He reports that he does not drink alcohol or use illicit drugs. family history includes Heart disease in his mother and Kidney disease in his other. Allergies  Allergen Reactions  . Doxycycline Hyclate   . Sulfonamide Derivatives    Current Outpatient Prescriptions on File Prior to Visit  Medication Sig Dispense Refill  . amLODipine (NORVASC) 2.5 MG tablet Take 2.5 mg by mouth daily.        Marland Kitchen amoxicillin (AMOXIL) 500 MG capsule 2 tabs by mouth twice per day  40 capsule  0  . aspirin EC 81 MG EC tablet Take 81 mg by mouth daily.        Marland Kitchen atorvastatin (LIPITOR) 20 MG tablet Take 1 tablet (20 mg total) by mouth daily.  90 tablet  3  . Glucosamine 500 MG TABS Take by mouth.        Marland Kitchen HYDROcodone-acetaminophen (NORCO) 10-325 MG per tablet Take 1 tablet by mouth every 12 (twelve) hours as needed.  60 tablet  3  . lansoprazole (PREVACID SOLUTAB) 30 MG disintegrating tablet Take 30 mg by mouth daily.        Marland Kitchen lisinopril (PRINIVIL,ZESTRIL) 40 MG tablet TAKE 1 TABLET BY MOUTH EVERY DAY  90 tablet  3  . metoprolol succinate (TOPROL-XL) 50 MG 24 hr tablet Take 1 tablet (50 mg total) by mouth daily. Take  1/2 tablet once daily  90 tablet  3  . Multiple Vitamin (MULTIVITAMIN) capsule Take 1 capsule by mouth daily.        . OMEGA 3 340 MG CPDR Take by mouth daily.         Review of Systems  Constitutional: Negative for diaphoresis and unexpected weight change.  HENT: Negative for tinnitus.   Eyes: Negative for photophobia and visual disturbance.  Respiratory: Negative for choking and stridor.   Gastrointestinal: Negative for vomiting and blood in stool.  Genitourinary: Negative for hematuria and decreased urine volume.  Musculoskeletal: Negative for gait problem.  Skin: Negative for color change and wound.  Neurological: Negative for tremors and numbness.  Psychiatric/Behavioral:  Negative for decreased concentration. The patient is not hyperactive.       Objective:   Physical Exam BP 142/90  Pulse 61  Temp 98.3 F (36.8 C) (Oral)  Ht 5\' 10"  (1.778 m)  Wt 199 lb (90.266 kg)  BMI 28.55 kg/m2  SpO2 97% Physical Exam  VS noted Constitutional: Pt appears well-developed and well-nourished.  HENT: Head: Normocephalic.  Right Ear: External ear normal.  Left Ear: External ear normal.  Eyes: Conjunctivae and EOM are normal. Pupils are equal, round, and reactive to light.  Neck: Normal range of motion. Neck supple.  Cardiovascular: Normal rate and regular rhythm.   Pulmonary/Chest: Effort normal and breath sounds normal.  Abd:  Soft, NT, non-distended, + BS Neurological: Pt is alert. Not confused  Skin: Skin is warm. No erythema.  Psychiatric: Pt behavior is normal. Thought content normal. 1+ nervous, not depressed affect    Assessment & Plan:

## 2012-08-02 NOTE — Patient Instructions (Addendum)
You had the flu shot today Continue all other medications as before Please have the pharmacy call with any refills you may need. Please continue your efforts at being more active, low cholesterol diet, and weight control. You are otherwise up to date with prevention measures today Please go to LAB in the Basement for the blood and/or urine tests to be done today You will be contacted by phone if any changes need to be made immediately.  Otherwise, you will receive a letter about your results with an explanation. Please remember to sign up for My Chart at your earliest convenience, as this will be important to you in the future with finding out test results. Please return in 6 months, or sooner if needed

## 2012-08-02 NOTE — Assessment & Plan Note (Signed)
stable overall by hx and exam, most recent data reviewed with pt, and pt to continue medical treatment as before Lab Results  Component Value Date   HGBA1C 6.5 08/14/2011   For lab today

## 2012-08-02 NOTE — Assessment & Plan Note (Signed)
stable overall by hx and exam, and pt to continue medical treatment as before 

## 2012-08-02 NOTE — Assessment & Plan Note (Signed)
stable overall by hx and exam, most recent data reviewed with pt, and pt to continue medical treatment as before, declines med change/increase today BP Readings from Last 3 Encounters:  08/02/12 142/90  02/01/12 152/80  08/14/11 138/88   For labs today

## 2012-10-19 ENCOUNTER — Other Ambulatory Visit: Payer: Self-pay | Admitting: Internal Medicine

## 2013-01-31 ENCOUNTER — Other Ambulatory Visit (INDEPENDENT_AMBULATORY_CARE_PROVIDER_SITE_OTHER): Payer: Medicare Other

## 2013-01-31 ENCOUNTER — Ambulatory Visit (INDEPENDENT_AMBULATORY_CARE_PROVIDER_SITE_OTHER): Payer: Medicare Other | Admitting: Internal Medicine

## 2013-01-31 ENCOUNTER — Other Ambulatory Visit: Payer: Self-pay | Admitting: Internal Medicine

## 2013-01-31 ENCOUNTER — Encounter: Payer: Self-pay | Admitting: Internal Medicine

## 2013-01-31 VITALS — BP 138/82 | HR 59 | Temp 99.0°F | Ht 70.0 in | Wt 201.4 lb

## 2013-01-31 DIAGNOSIS — E785 Hyperlipidemia, unspecified: Secondary | ICD-10-CM

## 2013-01-31 DIAGNOSIS — IMO0001 Reserved for inherently not codable concepts without codable children: Secondary | ICD-10-CM

## 2013-01-31 DIAGNOSIS — I10 Essential (primary) hypertension: Secondary | ICD-10-CM

## 2013-01-31 LAB — HEMOGLOBIN A1C: Hgb A1c MFr Bld: 7.1 % — ABNORMAL HIGH (ref 4.6–6.5)

## 2013-01-31 LAB — BASIC METABOLIC PANEL
Calcium: 9 mg/dL (ref 8.4–10.5)
Chloride: 102 mEq/L (ref 96–112)
Creatinine, Ser: 0.8 mg/dL (ref 0.4–1.5)

## 2013-01-31 LAB — HEPATIC FUNCTION PANEL
Bilirubin, Direct: 0.2 mg/dL (ref 0.0–0.3)
Total Bilirubin: 1 mg/dL (ref 0.3–1.2)

## 2013-01-31 LAB — LIPID PANEL
HDL: 49.8 mg/dL (ref >39.00)
LDL Cholesterol: 66 mg/dL (ref 0–99)
Total CHOL/HDL Ratio: 3
Triglycerides: 95 mg/dL (ref 0.0–149.0)

## 2013-01-31 MED ORDER — METFORMIN HCL ER 500 MG PO TB24: 500.0000 mg | ORAL_TABLET | Freq: Every day | ORAL | Status: DC

## 2013-01-31 MED ORDER — HYDROCODONE-ACETAMINOPHEN 10-325 MG PO TABS: 1.0000 | ORAL_TABLET | Freq: Two times a day (BID) | ORAL | Status: DC | PRN

## 2013-01-31 MED ORDER — ATORVASTATIN CALCIUM 20 MG PO TABS: 20.0000 mg | ORAL_TABLET | Freq: Every day | ORAL | Status: DC

## 2013-01-31 NOTE — Assessment & Plan Note (Signed)
Somewhat labile at home, I suggested increased amlodipine, but he declines, thinking BP may be too low later in the afternoon with symptoms that he does not have now BP Readings from Last 3 Encounters:  01/31/13 138/82  08/02/12 142/90  02/01/12 152/80

## 2013-01-31 NOTE — Assessment & Plan Note (Signed)
Currently diet controlled, for cont'd efforts at wt loss, exercise to continue, declines dm education for now, for lab and  to f/u any worsening symptoms or concerns Lab Results  Component Value Date   HGBA1C 6.7* 08/02/2012

## 2013-01-31 NOTE — Progress Notes (Signed)
Subjective:     Patient ID: David Bolton, male   DOB: Mar 17, 1941, 72 y.o.   MRN: [id number]  HPI  Here to f/u; overall doing ok,  Pt denies chest pain, increased sob or doe, wheezing, orthopnea, PND, increased LE swelling, palpitations, dizziness or syncope.  Pt denies polydipsia, polyuria, or low sugar symptoms such as weakness or confusion improved with po intake.  Pt denies new neurological symptoms such as new headache, or facial or extremity weakness or numbness.   Pt states overall good compliance with meds, has been trying to follow lower cholesterol, diabetic diet, with wt overall stable,  And trying to exercise most days at the gym. Marland Kitchen Bp tends to be about 160 in the AM, but 110 in the afternoons and reluctant to change any med dosing such as increased amlodipine Past Medical History  Diagnosis Date  . HYPERLIPIDEMIA 06/08/2007  . ANXIETY 06/08/2007  . DEPENDENCE, ALCOHOL NEC/NOS, IN REMISSION 06/08/2007  . DEPRESSION 06/08/2007  . HYPERTENSION 06/08/2007  . ALLERGIC RHINITIS 06/08/2007  . GERD 06/08/2007  . DIVERTICULOSIS, COLON 06/08/2007  . IBS 06/08/2007  . PILONIDAL CYST 06/08/2007  . DEGENERATION, CERVICAL DISC 07/30/2007  . Cervicalgia 12/05/2007  . LOW BACK PAIN, CHRONIC 08/12/2010  . PLANTAR FASCIITIS 06/08/2007  . FATIGUE 09/10/2008  . Headache 06/08/2007  . Cough 08/12/2010  . Nocturia 07/16/2009  . EPIGASTRIC TENDERNESS 12/05/2007  . COLONIC POLYPS, HX OF 06/08/2007  . Impaired glucose tolerance 02/10/2011   Past Surgical History  Procedure Laterality Date  . Pilonidal cyst surgury    . Bladder and prostate surgury    . Cervical fusion      reports that he has never smoked. He does not have any smokeless tobacco history on file. He reports that he does not drink alcohol or use illicit drugs. family history includes Heart disease in his mother and Kidney disease in his other. Allergies  Allergen Reactions  . Doxycycline Hyclate   . Sulfonamide Derivatives    Current Outpatient  Prescriptions on File Prior to Visit  Medication Sig Dispense Refill  . amLODipine (NORVASC) 2.5 MG tablet Take 2.5 mg by mouth daily.        Marland Kitchen aspirin EC 81 MG EC tablet Take 81 mg by mouth daily.        . Glucosamine 500 MG TABS Take by mouth.        . lansoprazole (PREVACID SOLUTAB) 30 MG disintegrating tablet Take 30 mg by mouth daily.        Marland Kitchen lisinopril (PRINIVIL,ZESTRIL) 40 MG tablet TAKE 1 TABLET BY MOUTH EVERY DAY  90 tablet  3  . metoprolol succinate (TOPROL-XL) 50 MG 24 hr tablet Take 1 tablet (50 mg total) by mouth daily. Take 1/2 tablet once daily  90 tablet  3  . Multiple Vitamin (MULTIVITAMIN) capsule Take 1 capsule by mouth daily.        . OMEGA 3 340 MG CPDR Take by mouth daily.         No current facility-administered medications on file prior to visit.   Review of Systems  Constitutional: Negative for unexpected weight change, or unusual diaphoresis  HENT: Negative for tinnitus.   Eyes: Negative for photophobia and visual disturbance.  Respiratory: Negative for choking and stridor.   Gastrointestinal: Negative for vomiting and blood in stool.  Genitourinary: Negative for hematuria and decreased urine volume.  Musculoskeletal: Negative for acute joint swelling Skin: Negative for color change and wound.  Neurological: Negative for tremors and  numbness other than noted  Psychiatric/Behavioral: Negative for decreased concentration or  hyperactivity.       Objective:   Physical Exam BP 138/82  Pulse 59  Temp(Src) 99 F (37.2 C) (Oral)  Ht 5\' 10"  (1.778 m)  Wt 201 lb 6 oz (91.343 kg)  BMI 28.89 kg/m2  SpO2 98% N/A VS noted,  Constitutional: Pt appears well-developed and well-nourished.  HENT: Head: NCAT.  Right Ear: External ear normal.  Left Ear: External ear normal.  Eyes: Conjunctivae and EOM are normal. Pupils are equal, round, and reactive to light.  Neck: Normal range of motion. Neck supple.  Cardiovascular: Normal rate and regular rhythm.    Pulmonary/Chest: Effort normal and breath sounds normal.  Abd:  Soft, NT, non-distended, + BS Neurological: Pt is alert. Not confused  Skin: Skin is warm. No erythema.  Psychiatric: Pt behavior is normal. Thought content normal.      Assessment:       Plan:

## 2013-01-31 NOTE — Assessment & Plan Note (Signed)
stable overall by history and exam, recent data reviewed with pt, and pt to continue medical treatment as before,  to f/u any worsening symptoms or concerns Lab Results  Component Value Date   LDLCALC 76 08/02/2012

## 2013-01-31 NOTE — Patient Instructions (Addendum)
Please continue all other medications as before, and refills have been done if requested. Please go to the LAB in the Basement (turn left off the elevator) for the tests to be done today You will be contacted by phone if any changes need to be made immediately.  Otherwise, you will receive a letter about your results with an explanation, but please check with MyChart first. Thank you for enrolling in MyChart. Please follow the instructions below to securely access your online medical record. MyChart allows you to send messages to your doctor, view your test results, renew your prescriptions, schedule appointments, and more To Log into My Chart online, please go by [facility] or [employer] to [email or web address], or download the MyChart App from the [employer] .  Your Username is: [email or web address] (pass [email or web address]) Please send a practice Message on Mychart later today. Please return in 6 months, or sooner if needed

## 2013-02-03 ENCOUNTER — Telehealth: Payer: Self-pay | Admitting: Internal Medicine

## 2013-02-03 NOTE — Telephone Encounter (Signed)
Returned phone call and gave the patient results.

## 2013-02-03 NOTE — Telephone Encounter (Signed)
Patient requesting call back about her test results, she has questions about her blood sugar and medication

## 2013-04-10 ENCOUNTER — Other Ambulatory Visit: Payer: Self-pay | Admitting: Internal Medicine

## 2013-06-04 ENCOUNTER — Other Ambulatory Visit: Payer: Self-pay

## 2013-08-04 ENCOUNTER — Ambulatory Visit: Payer: Medicare Other | Admitting: Internal Medicine

## 2013-08-06 ENCOUNTER — Ambulatory Visit (INDEPENDENT_AMBULATORY_CARE_PROVIDER_SITE_OTHER): Payer: Medicare Other | Admitting: Internal Medicine

## 2013-08-06 ENCOUNTER — Encounter: Payer: Self-pay | Admitting: Internal Medicine

## 2013-08-06 ENCOUNTER — Other Ambulatory Visit (INDEPENDENT_AMBULATORY_CARE_PROVIDER_SITE_OTHER): Payer: Medicare Other

## 2013-08-06 VITALS — BP 144/90 | HR 55 | Temp 97.8°F | Ht 70.0 in | Wt 196.2 lb

## 2013-08-06 DIAGNOSIS — Z23 Encounter for immunization: Secondary | ICD-10-CM

## 2013-08-06 DIAGNOSIS — I10 Essential (primary) hypertension: Secondary | ICD-10-CM | POA: Diagnosis not present

## 2013-08-06 DIAGNOSIS — E785 Hyperlipidemia, unspecified: Secondary | ICD-10-CM

## 2013-08-06 DIAGNOSIS — N320 Bladder-neck obstruction: Secondary | ICD-10-CM

## 2013-08-06 DIAGNOSIS — IMO0001 Reserved for inherently not codable concepts without codable children: Secondary | ICD-10-CM

## 2013-08-06 LAB — CBC WITH DIFFERENTIAL/PLATELET
Basophils Absolute: 0 10*3/uL (ref 0.0–0.1)
Basophils Relative: 0.5 % (ref 0.0–3.0)
Eosinophils Absolute: 0.2 10*3/uL (ref 0.0–0.7)
Lymphocytes Relative: 29.5 % (ref 12.0–46.0)
MCHC: 34.3 g/dL (ref 30.0–36.0)
Neutro Abs: 3.9 10*3/uL (ref 1.4–7.7)
Neutrophils Relative %: 57.6 % (ref 43.0–77.0)
Platelets: 235 10*3/uL (ref 150.0–400.0)
RBC: 4.87 Mil/uL (ref 4.22–5.81)
RDW: 12.6 % (ref 11.5–14.6)

## 2013-08-06 LAB — URINALYSIS, ROUTINE W REFLEX MICROSCOPIC
Bilirubin Urine: NEGATIVE
Hgb urine dipstick: NEGATIVE
Leukocytes, UA: NEGATIVE
Nitrite: NEGATIVE
RBC / HPF: NONE SEEN (ref 0–?)
Specific Gravity, Urine: 1.025 (ref 1.000–1.030)
Total Protein, Urine: NEGATIVE
Urobilinogen, UA: 0.2 (ref 0.0–1.0)

## 2013-08-06 LAB — MICROALBUMIN / CREATININE URINE RATIO
Creatinine,U: 186.8 mg/dL
Microalb Creat Ratio: 1 mg/g (ref 0.0–30.0)
Microalb, Ur: 1.8 mg/dL (ref 0.0–1.9)

## 2013-08-06 MED ORDER — HYDROCODONE-ACETAMINOPHEN 10-325 MG PO TABS: 1.0000 | ORAL_TABLET | Freq: Two times a day (BID) | ORAL | Status: DC | PRN

## 2013-08-06 NOTE — Patient Instructions (Addendum)
You had the flu shot today, and the Prevnar pneumonia shot Please continue all other medications as before, and refills have been done if requested. - the pain medication Please have the pharmacy call with any other refills you may need. Please continue your efforts at being more active, low cholesterol diet, and weight control. You are otherwise up to date with prevention measures today. Please keep your appointments with your specialists as you may have planned Please go to the LAB in the Basement (turn left off the elevator) for the tests to be done today You will be contacted by phone if any changes need to be made immediately.  Otherwise, you will receive a letter about your results with an explanation, but please check with MyChart first.  Please remember to sign up for My Chart if you have not done so, as this will be important to you in the future with finding out test results, communicating by private email, and scheduling acute appointments online when needed.  Please return in 6 months, or sooner if needed

## 2013-08-06 NOTE — Assessment & Plan Note (Signed)
stable overall by history and exam, recent data reviewed with pt, and pt to continue medical treatment as before,  to f/u any worsening symptoms or concerns BP Readings from Last 3 Encounters:  08/06/13 144/90  01/31/13 138/82  08/02/12 142/90

## 2013-08-06 NOTE — Progress Notes (Signed)
Subjective:    Patient ID: David Bolton, male    DOB: 1941/04/08, 72 y.o.   MRN: [id number]  HPI   Here to f/u; overall doing ok,  Pt denies chest pain, increased sob or doe, wheezing, orthopnea, PND, increased LE swelling, palpitations, dizziness or syncope.  Pt denies polydipsia, polyuria, or low sugar symptoms such as weakness or confusion improved with po intake.  Pt denies new neurological symptoms such as new headache, or facial or extremity weakness or numbness.   Pt states overall good compliance with meds, has been trying to follow lower cholesterol, diabetic diet, with wt overall stable,  but little exercise however.   Pt denies fever, wt loss, night sweats, loss of appetite, or other constitutional symptoms. Denies worsening depressive symptoms, suicidal ideation, or panic. Past Medical History  Diagnosis Date  . HYPERLIPIDEMIA 06/08/2007  . ANXIETY 06/08/2007  . DEPENDENCE, ALCOHOL NEC/NOS, IN REMISSION 06/08/2007  . DEPRESSION 06/08/2007  . HYPERTENSION 06/08/2007  . ALLERGIC RHINITIS 06/08/2007  . GERD 06/08/2007  . DIVERTICULOSIS, COLON 06/08/2007  . IBS 06/08/2007  . PILONIDAL CYST 06/08/2007  . DEGENERATION, CERVICAL DISC 07/30/2007  . Cervicalgia 12/05/2007  . LOW BACK PAIN, CHRONIC 08/12/2010  . PLANTAR FASCIITIS 06/08/2007  . FATIGUE 09/10/2008  . Headache(784.0) 06/08/2007  . Cough 08/12/2010  . Nocturia 07/16/2009  . EPIGASTRIC TENDERNESS 12/05/2007  . COLONIC POLYPS, HX OF 06/08/2007  . Impaired glucose tolerance 02/10/2011   Past Surgical History  Procedure Laterality Date  . Pilonidal cyst surgury    . Bladder and prostate surgury    . Cervical fusion      reports that he has never smoked. He does not have any smokeless tobacco history on file. He reports that he does not drink alcohol or use illicit drugs. family history includes Heart disease in his mother; Kidney disease in his other. Allergies  Allergen Reactions  . Doxycycline Hyclate   . Sulfonamide Derivatives    Current  Outpatient Prescriptions on File Prior to Visit  Medication Sig Dispense Refill  . amLODipine (NORVASC) 2.5 MG tablet Take 2.5 mg by mouth daily.        Marland Kitchen aspirin EC 81 MG EC tablet Take 81 mg by mouth daily.        Marland Kitchen atorvastatin (LIPITOR) 20 MG tablet Take 1 tablet (20 mg total) by mouth daily.  90 tablet  3  . Glucosamine 500 MG TABS Take by mouth.        Marland Kitchen HYDROcodone-acetaminophen (NORCO) 10-325 MG per tablet Take 1 tablet by mouth every 12 (twelve) hours as needed.  60 tablet  3  . lansoprazole (PREVACID SOLUTAB) 30 MG disintegrating tablet Take 30 mg by mouth daily.        Marland Kitchen lisinopril (PRINIVIL,ZESTRIL) 40 MG tablet TAKE 1 TABLET BY MOUTH EVERY DAY  90 tablet  3  . metFORMIN (GLUCOPHAGE-XR) 500 MG 24 hr tablet Take 1 tablet (500 mg total) by mouth daily with breakfast.  90 tablet  3  . metoprolol succinate (TOPROL-XL) 50 MG 24 hr tablet TAKE 1/2 TABLET BY MOUTH DAILY  90 tablet  3  . Multiple Vitamin (MULTIVITAMIN) capsule Take 1 capsule by mouth daily.        . OMEGA 3 340 MG CPDR Take by mouth daily.         No current facility-administered medications on file prior to visit.   Review of Systems  Constitutional: Negative for unexpected weight change, or unusual diaphoresis  HENT: Negative for tinnitus.  Eyes: Negative for photophobia and visual disturbance.  Respiratory: Negative for choking and stridor.   Gastrointestinal: Negative for vomiting and blood in stool.  Genitourinary: Negative for hematuria and decreased urine volume.  Musculoskeletal: Negative for acute joint swelling Skin: Negative for color change and wound.  Neurological: Negative for tremors and numbness other than noted  Psychiatric/Behavioral: Negative for decreased concentration or  hyperactivity.       Objective:   Physical Exam BP 144/90  Pulse 55  Temp(Src) 97.8 F (36.6 C) (Oral)  Ht 5\' 10"  (1.778 m)  Wt 196 lb 4 oz (89.018 kg)  BMI 28.16 kg/m2  SpO2 97% VS noted,  Constitutional: Pt appears  well-developed and well-nourished.  HENT: Head: NCAT.  Right Ear: External ear normal.  Left Ear: External ear normal.  Eyes: Conjunctivae and EOM are normal. Pupils are equal, round, and reactive to light.  Neck: Normal range of motion. Neck supple.  Cardiovascular: Normal rate and regular rhythm.   Pulmonary/Chest: Effort normal and breath sounds normal.  Abd:  Soft, NT, non-distended, + BS Neurological: Pt is alert. Not confused  Skin: Skin is warm. No erythema.  Psychiatric: Pt behavior is normal. Thought content normal.     Assessment & Plan:

## 2013-08-06 NOTE — Addendum Note (Signed)
Addended by: Scharlene Gloss B on: 08/06/2013 10:01 AM   Modules accepted: Orders

## 2013-08-06 NOTE — Assessment & Plan Note (Signed)
stable overall by history and exam, recent data reviewed with pt, and pt to continue medical treatment as before,  to f/u any worsening symptoms or concerns Lab Results  Component Value Date   LDLCALC 66 01/31/2013

## 2013-08-06 NOTE — Assessment & Plan Note (Signed)
Also due for psa 

## 2013-08-06 NOTE — Assessment & Plan Note (Signed)
stable overall by history and exam, recent data reviewed with pt, and pt to continue medical treatment as before,  to f/u any worsening symptoms or concerns Lab Results  Component Value Date   HGBA1C 7.1* 01/31/2013

## 2013-08-07 LAB — TSH: TSH: 1.68 u[IU]/mL (ref 0.35–5.50)

## 2013-08-07 LAB — HEPATIC FUNCTION PANEL
ALT: 27 U/L (ref 0–53)
AST: 39 U/L — ABNORMAL HIGH (ref 0–37)
Albumin: 4.5 g/dL (ref 3.5–5.2)
Alkaline Phosphatase: 49 U/L (ref 39–117)
Bilirubin, Direct: 0.2 mg/dL (ref 0.0–0.3)
Total Protein: 7.6 g/dL (ref 6.0–8.3)

## 2013-08-07 LAB — BASIC METABOLIC PANEL
CO2: 25 mEq/L (ref 19–32)
Chloride: 104 mEq/L (ref 96–112)
Creatinine, Ser: 0.9 mg/dL (ref 0.4–1.5)
GFR: 89.86 mL/min (ref >60.00)
Potassium: 4.5 mEq/L (ref 3.5–5.1)

## 2013-08-07 LAB — LIPID PANEL
LDL Cholesterol: 65 mg/dL (ref 0–99)
Total CHOL/HDL Ratio: 2
Triglycerides: 85 mg/dL (ref 0.0–149.0)

## 2013-08-07 LAB — PSA: PSA: 1.94 ng/mL (ref 0.10–4.00)

## 2013-08-28 ENCOUNTER — Encounter: Payer: Self-pay | Admitting: Internal Medicine

## 2013-08-28 ENCOUNTER — Ambulatory Visit (INDEPENDENT_AMBULATORY_CARE_PROVIDER_SITE_OTHER): Payer: Medicare Other | Admitting: Internal Medicine

## 2013-08-28 VITALS — BP 140/90 | HR 64 | Temp 97.8°F | Ht 70.0 in | Wt 198.0 lb

## 2013-08-28 DIAGNOSIS — J029 Acute pharyngitis, unspecified: Secondary | ICD-10-CM

## 2013-08-28 DIAGNOSIS — F329 Major depressive disorder, single episode, unspecified: Secondary | ICD-10-CM | POA: Diagnosis not present

## 2013-08-28 DIAGNOSIS — F3289 Other specified depressive episodes: Secondary | ICD-10-CM

## 2013-08-28 DIAGNOSIS — I10 Essential (primary) hypertension: Secondary | ICD-10-CM

## 2013-08-28 MED ORDER — LEVOFLOXACIN 250 MG PO TABS: 250.0000 mg | ORAL_TABLET | Freq: Every day | ORAL | Status: DC

## 2013-08-28 NOTE — Progress Notes (Signed)
Subjective:    Patient ID: David Bolton, male    DOB: Aug 05, 1941, 72 y.o.   MRN: [id number]  HPI   Here with 2-3 days acute onset fever, severe ST pain, pressure, headache, general weakness and malaise, and with mild cough, but pt denies chest pain, wheezing, increased sob or doe, orthopnea, PND, increased LE swelling, palpitations, dizziness or syncope.  Pt denies new neurological symptoms such as new headache, or facial or extremity weakness or numbness   Pt denies polydipsia, polyuria, Past Medical History  Diagnosis Date  . HYPERLIPIDEMIA 06/08/2007  . ANXIETY 06/08/2007  . DEPENDENCE, ALCOHOL NEC/NOS, IN REMISSION 06/08/2007  . DEPRESSION 06/08/2007  . HYPERTENSION 06/08/2007  . ALLERGIC RHINITIS 06/08/2007  . GERD 06/08/2007  . DIVERTICULOSIS, COLON 06/08/2007  . IBS 06/08/2007  . PILONIDAL CYST 06/08/2007  . DEGENERATION, CERVICAL DISC 07/30/2007  . Cervicalgia 12/05/2007  . LOW BACK PAIN, CHRONIC 08/12/2010  . PLANTAR FASCIITIS 06/08/2007  . FATIGUE 09/10/2008  . Headache(784.0) 06/08/2007  . Cough 08/12/2010  . Nocturia 07/16/2009  . EPIGASTRIC TENDERNESS 12/05/2007  . COLONIC POLYPS, HX OF 06/08/2007  . Impaired glucose tolerance 02/10/2011   Past Surgical History  Procedure Laterality Date  . Pilonidal cyst surgury    . Bladder and prostate surgury    . Cervical fusion      reports that he has never smoked. He does not have any smokeless tobacco history on file. He reports that he does not drink alcohol or use illicit drugs. family history includes Heart disease in his mother; Kidney disease in his other. Allergies  Allergen Reactions  . Doxycycline Hyclate   . Sulfonamide Derivatives    Current Outpatient Prescriptions on File Prior to Visit  Medication Sig Dispense Refill  . amLODipine (NORVASC) 2.5 MG tablet Take 2.5 mg by mouth daily.        Marland Kitchen aspirin EC 81 MG EC tablet Take 81 mg by mouth daily.        Marland Kitchen atorvastatin (LIPITOR) 20 MG tablet Take 1 tablet (20 mg total) by mouth daily.   90 tablet  3  . Glucosamine 500 MG TABS Take by mouth.        Marland Kitchen HYDROcodone-acetaminophen (NORCO) 10-325 MG per tablet Take 1 tablet by mouth every 12 (twelve) hours as needed. To fill Oct 05, 2013  60 tablet  0  . lansoprazole (PREVACID SOLUTAB) 30 MG disintegrating tablet Take 30 mg by mouth daily.        Marland Kitchen lisinopril (PRINIVIL,ZESTRIL) 40 MG tablet TAKE 1 TABLET BY MOUTH EVERY DAY  90 tablet  3  . metFORMIN (GLUCOPHAGE-XR) 500 MG 24 hr tablet Take 1 tablet (500 mg total) by mouth daily with breakfast.  90 tablet  3  . metoprolol succinate (TOPROL-XL) 50 MG 24 hr tablet TAKE 1/2 TABLET BY MOUTH DAILY  90 tablet  3  . Multiple Vitamin (MULTIVITAMIN) capsule Take 1 capsule by mouth daily.        . OMEGA 3 340 MG CPDR Take by mouth daily.         No current facility-administered medications on file prior to visit.   Review of Systems  Constitutional: Negative for unexpected weight change, or unusual diaphoresis  HENT: Negative for tinnitus.   Eyes: Negative for photophobia and visual disturbance.  Respiratory: Negative for choking and stridor.   Gastrointestinal: Negative for vomiting and blood in stool.  Genitourinary: Negative for hematuria and decreased urine volume.  Musculoskeletal: Negative for acute joint swelling Skin: Negative  for color change and wound.  Neurological: Negative for tremors and numbness other than noted  Psychiatric/Behavioral: Negative for decreased concentration or  hyperactivity.       Objective:   Physical Exam BP 140/90  Pulse 64  Temp(Src) 97.8 F (36.6 C) (Oral)  Ht 5\' 10"  (1.778 m)  Wt 198 lb (89.812 kg)  BMI 28.41 kg/m2  SpO2 97% VS noted, mild ill Constitutional: Pt appears well-developed and well-nourished.  HENT: Head: NCAT.  Right Ear: External ear normal.  Left Ear: External ear normal.  Eyes: Conjunctivae and EOM are normal. Pupils are equal, round, and reactive to light.  Bilat tm's with mild erythema.  Max sinus areas mild tender.   Pharynx with mild erythema, no exudate Neck: Normal range of motion. Neck supple. but with bilat submandib LA marked tender Cardiovascular: Normal rate and regular rhythm.   Pulmonary/Chest: Effort normal and breath sounds normal.  Abd:  Soft, NT, non-distended, + BS - no HSM Neurological: Pt is alert. Not confused  Skin: Skin is warm. No erythema.  Psychiatric: Pt behavior is normal. Thought content normal. mild nervous, not depressed affect    Assessment & Plan:

## 2013-08-28 NOTE — Patient Instructions (Signed)
Please take all new medication as prescribed Please continue all other medications as before Please have the pharmacy call with any other refills you may need.   

## 2013-08-31 DIAGNOSIS — J029 Acute pharyngitis, unspecified: Secondary | ICD-10-CM | POA: Insufficient documentation

## 2013-08-31 NOTE — Assessment & Plan Note (Signed)
stable overall by history and exam, recent data reviewed with pt, and pt to continue medical treatment as before,  to f/u any worsening symptoms or concerns BP Readings from Last 3 Encounters:  08/28/13 140/90  08/06/13 144/90  01/31/13 138/82

## 2013-08-31 NOTE — Assessment & Plan Note (Signed)
stable overall by history and exam, and pt to continue medical treatment as before,  to f/u any worsening symptoms or concerns 

## 2013-08-31 NOTE — Assessment & Plan Note (Signed)
mod, for antibx course,  to f/u any worsening symptoms or concerns 

## 2013-11-10 ENCOUNTER — Other Ambulatory Visit: Payer: Self-pay | Admitting: Internal Medicine

## 2013-11-10 NOTE — Telephone Encounter (Signed)
Refill done.  

## 2013-12-18 ENCOUNTER — Ambulatory Visit (INDEPENDENT_AMBULATORY_CARE_PROVIDER_SITE_OTHER): Payer: Medicare Other | Admitting: Internal Medicine

## 2013-12-18 ENCOUNTER — Encounter: Payer: Self-pay | Admitting: Internal Medicine

## 2013-12-18 ENCOUNTER — Telehealth: Payer: Self-pay | Admitting: *Deleted

## 2013-12-18 ENCOUNTER — Telehealth: Payer: Self-pay

## 2013-12-18 VITALS — BP 162/92 | HR 77 | Temp 97.0°F | Ht 70.0 in | Wt 198.2 lb

## 2013-12-18 DIAGNOSIS — I10 Essential (primary) hypertension: Secondary | ICD-10-CM

## 2013-12-18 DIAGNOSIS — F411 Generalized anxiety disorder: Secondary | ICD-10-CM | POA: Diagnosis not present

## 2013-12-18 DIAGNOSIS — K645 Perianal venous thrombosis: Secondary | ICD-10-CM

## 2013-12-18 MED ORDER — LIDOCAINE-HYDROCORTISONE ACE 3-0.5 % RE CREA: 1.0000 | TOPICAL_CREAM | Freq: Two times a day (BID) | RECTAL | Status: DC

## 2013-12-18 NOTE — Telephone Encounter (Signed)
Called the pharmacy informed of MD instructions.  

## 2013-12-18 NOTE — Telephone Encounter (Signed)
[facility] Triage Call Report Triage Record Num: [id number] Operator: Vaughan Sine Patient Name: David Bolton Call Date & Time: 12/18/2013 8:15:55AM Patient Phone: [phone] PCP: Cathlean Cower Patient Gender: Male PCP Fax : [phone] Patient DOB: [birth year] Practice Name[facility] Reason for Call: Caller: Brent/Patient; PCP: Cathlean Cower (Adults only); CB#: [phone]; Call regarding Swollen area protruding slightly outside of rectum from inside, w/ harden on outter part of buttock, onset 2-18. Pt experiencing severe pain, unable to sit. Pt had similar sxs in the past, GI specialist removed over 8 years ago. Pt had BM on 2-19, very painful, no bleeding. Rectal sxs protocol used, ED dispo d/t severe, unrelenting rectal pain. Pt prefers to see Dr Jenny Reichmann, appt scheduled at 11am on 2-19. Protocol(s) Used: Rectal Symptoms Recommended Outcome per Protocol: See ED Immediately Reason for Outcome: Severe, unrelenting rectal pain Care Advice: ~ 02/

## 2013-12-18 NOTE — Patient Instructions (Signed)
Please take all new medication as prescribed  Please continue all other medications as before, and refills have been done if requested.  Please have the pharmacy call with any other refills you may need.   

## 2013-12-18 NOTE — Telephone Encounter (Signed)
The one with the medication , since I am not sure of the difference.  If both include the medication, then the kit would be better

## 2013-12-18 NOTE — Progress Notes (Signed)
Subjective:    Patient ID: David Bolton, male    DOB: 11-08-1940, 73 y.o.   MRN: [id number]  HPI  Here with acute onset severe pain,swelling x 3 days to rectal area, without fever, drainage, better to stand, very painful to sit. No prior hx. Pt denies chest pain, increased sob or doe, wheezing, orthopnea, PND, increased LE swelling, palpitations, dizziness or syncope.   Pt denies polydipsia, polyuria. Pt denies new neurological symptoms such as new headache, or facial or extremity weakness or numbness  Denies worsening depressive symptoms, suicidal ideation, or panic Past Medical History  Diagnosis Date  . HYPERLIPIDEMIA 06/08/2007  . ANXIETY 06/08/2007  . DEPENDENCE, ALCOHOL NEC/NOS, IN REMISSION 06/08/2007  . DEPRESSION 06/08/2007  . HYPERTENSION 06/08/2007  . ALLERGIC RHINITIS 06/08/2007  . GERD 06/08/2007  . DIVERTICULOSIS, COLON 06/08/2007  . IBS 06/08/2007  . PILONIDAL CYST 06/08/2007  . DEGENERATION, CERVICAL DISC 07/30/2007  . Cervicalgia 12/05/2007  . LOW BACK PAIN, CHRONIC 08/12/2010  . PLANTAR FASCIITIS 06/08/2007  . FATIGUE 09/10/2008  . Headache(784.0) 06/08/2007  . Cough 08/12/2010  . Nocturia 07/16/2009  . EPIGASTRIC TENDERNESS 12/05/2007  . COLONIC POLYPS, HX OF 06/08/2007  . Impaired glucose tolerance 02/10/2011   Past Surgical History  Procedure Laterality Date  . Pilonidal cyst surgury    . Bladder and prostate surgury    . Cervical fusion      reports that he has never smoked. He does not have any smokeless tobacco history on file. He reports that he does not drink alcohol or use illicit drugs. family history includes Heart disease in his mother; Kidney disease in his other. Allergies  Allergen Reactions  . Doxycycline Hyclate   . Sulfonamide Derivatives    Current Outpatient Prescriptions on File Prior to Visit  Medication Sig Dispense Refill  . amLODipine (NORVASC) 2.5 MG tablet Take 2.5 mg by mouth daily.        Marland Kitchen aspirin EC 81 MG EC tablet Take 81 mg by mouth daily.          Marland Kitchen atorvastatin (LIPITOR) 20 MG tablet Take 1 tablet (20 mg total) by mouth daily.  90 tablet  3  . Glucosamine 500 MG TABS Take by mouth.        Marland Kitchen HYDROcodone-acetaminophen (NORCO) 10-325 MG per tablet Take 1 tablet by mouth every 12 (twelve) hours as needed. To fill Oct 05, 2013  60 tablet  0  . lansoprazole (PREVACID SOLUTAB) 30 MG disintegrating tablet Take 30 mg by mouth daily.        Marland Kitchen lisinopril (PRINIVIL,ZESTRIL) 40 MG tablet TAKE 1 TABLET BY MOUTH ONCE A DAY  90 tablet  1  . metFORMIN (GLUCOPHAGE-XR) 500 MG 24 hr tablet Take 1 tablet (500 mg total) by mouth daily with breakfast.  90 tablet  3  . metoprolol succinate (TOPROL-XL) 50 MG 24 hr tablet TAKE 1/2 TABLET BY MOUTH DAILY  90 tablet  3  . Multiple Vitamin (MULTIVITAMIN) capsule Take 1 capsule by mouth daily.        . OMEGA 3 340 MG CPDR Take by mouth daily.         No current facility-administered medications on file prior to visit.   Review of Systems  Constitutional: Negative for unexpected weight change, or unusual diaphoresis  HENT: Negative for tinnitus.   Eyes: Negative for photophobia and visual disturbance.  Respiratory: Negative for choking and stridor.   Gastrointestinal: Negative for vomiting and blood in stool.  Genitourinary: Negative for  hematuria and decreased urine volume.  Musculoskeletal: Negative for acute joint swelling Skin: Negative for color change and wound.  Neurological: Negative for tremors and numbness other than noted  Psychiatric/Behavioral: Negative for decreased concentration or  hyperactivity.       Objective:   Physical Exam BP 162/92  Pulse 77  Temp(Src) 97 F (36.1 C) (Oral)  Ht 5\' 10"  (1.778 m)  Wt 198 lb 4 oz (89.926 kg)  BMI 28.45 kg/m2  SpO2 97% VS noted,  Constitutional: Pt appears well-developed and well-nourished.  HENT: Head: NCAT.  Right Ear: External ear normal.  Left Ear: External ear normal.  Eyes: Conjunctivae and EOM are normal. Pupils are equal, round, and  reactive to light.  Neck: Normal range of motion. Neck supple.  Cardiovascular: Normal rate and regular rhythm.   Pulmonary/Chest: Effort normal and breath sounds normal.  Abd:  Soft, NT, non-distended, + BS Rectal: with 2-3+ left sided 1.5 cm area tender ext hemorrhoidal swellng, , no bleeding, no drainage or abscess Neurological: Pt is alert. Not confused  Skin: Skin is warm. No erythema.  Psychiatric: Pt behavior is normal. Thought content normal.     Assessment & Plan:

## 2013-12-18 NOTE — Telephone Encounter (Signed)
[facility] needs clarification on script sent in today, do you want the kit or applicator only.

## 2013-12-18 NOTE — Progress Notes (Signed)
Pre-visit discussion using our clinic review tool. No additional management support is needed unless otherwise documented below in the visit note.  

## 2013-12-20 DIAGNOSIS — K645 Perianal venous thrombosis: Secondary | ICD-10-CM | POA: Insufficient documentation

## 2013-12-20 NOTE — Assessment & Plan Note (Signed)
D/w pt, educated, reassured, pt declines gen surg referrral, for anamantle bid prn,  to f/u any worsening symptoms or concerns

## 2013-12-20 NOTE — Assessment & Plan Note (Signed)
stable overall by history and exam, and pt to continue medical treatment as before,  to f/u any worsening symptoms or concerns 

## 2013-12-20 NOTE — Assessment & Plan Note (Signed)
elev today, likely situational, stable overall by history and exam, recent data reviewed with pt, and pt to continue medical treatment as before,  to f/u any worsening symptoms or concerns BP Readings from Last 3 Encounters:  12/18/13 162/92  08/28/13 140/90  08/06/13 144/90

## 2014-02-04 ENCOUNTER — Encounter: Payer: Self-pay | Admitting: Internal Medicine

## 2014-02-04 ENCOUNTER — Ambulatory Visit (INDEPENDENT_AMBULATORY_CARE_PROVIDER_SITE_OTHER): Payer: Medicare Other | Admitting: Internal Medicine

## 2014-02-04 ENCOUNTER — Other Ambulatory Visit: Payer: Self-pay | Admitting: Internal Medicine

## 2014-02-04 ENCOUNTER — Other Ambulatory Visit (INDEPENDENT_AMBULATORY_CARE_PROVIDER_SITE_OTHER): Payer: Medicare Other

## 2014-02-04 VITALS — BP 152/92 | HR 64 | Temp 98.4°F | Ht 70.0 in | Wt 196.4 lb

## 2014-02-04 DIAGNOSIS — M545 Low back pain, unspecified: Secondary | ICD-10-CM

## 2014-02-04 DIAGNOSIS — E1165 Type 2 diabetes mellitus with hyperglycemia: Principal | ICD-10-CM

## 2014-02-04 DIAGNOSIS — IMO0001 Reserved for inherently not codable concepts without codable children: Secondary | ICD-10-CM

## 2014-02-04 DIAGNOSIS — I10 Essential (primary) hypertension: Secondary | ICD-10-CM | POA: Diagnosis not present

## 2014-02-04 DIAGNOSIS — E785 Hyperlipidemia, unspecified: Secondary | ICD-10-CM

## 2014-02-04 LAB — BASIC METABOLIC PANEL
BUN: 15 mg/dL (ref 6–23)
CALCIUM: 9.4 mg/dL (ref 8.4–10.5)
CO2: 27 meq/L (ref 19–32)
Chloride: 103 mEq/L (ref 96–112)
Creatinine, Ser: 0.8 mg/dL (ref 0.4–1.5)
GFR: 104.48 mL/min (ref >60.00)
Glucose, Bld: 146 mg/dL — ABNORMAL HIGH (ref 70–99)
POTASSIUM: 4.6 meq/L (ref 3.5–5.1)
SODIUM: 137 meq/L (ref 135–145)

## 2014-02-04 LAB — HEPATIC FUNCTION PANEL
ALK PHOS: 55 U/L (ref 39–117)
ALT: 27 U/L (ref 0–53)
AST: 33 U/L (ref 0–37)
Albumin: 4.1 g/dL (ref 3.5–5.2)
Bilirubin, Direct: 0.2 mg/dL (ref 0.0–0.3)
TOTAL PROTEIN: 7.8 g/dL (ref 6.0–8.3)
Total Bilirubin: 1.1 mg/dL (ref 0.3–1.2)

## 2014-02-04 LAB — LIPID PANEL
CHOLESTEROL: 143 mg/dL (ref 0–200)
HDL: 51.8 mg/dL (ref >39.00)
LDL Cholesterol: 74 mg/dL (ref 0–99)
TRIGLYCERIDES: 84 mg/dL (ref 0.0–149.0)
Total CHOL/HDL Ratio: 3
VLDL: 16.8 mg/dL (ref 0.0–40.0)

## 2014-02-04 LAB — HEMOGLOBIN A1C: HEMOGLOBIN A1C: 7.4 % — AB (ref 4.6–6.5)

## 2014-02-04 MED ORDER — METFORMIN HCL ER 500 MG PO TB24: ORAL_TABLET | ORAL | Status: DC

## 2014-02-04 MED ORDER — HYDROCODONE-ACETAMINOPHEN 10-325 MG PO TABS: 1.0000 | ORAL_TABLET | Freq: Every evening | ORAL | Status: DC | PRN

## 2014-02-04 MED ORDER — METFORMIN HCL ER 500 MG PO TB24: 500.0000 mg | ORAL_TABLET | Freq: Every day | ORAL | Status: DC

## 2014-02-04 MED ORDER — ATORVASTATIN CALCIUM 20 MG PO TABS: 20.0000 mg | ORAL_TABLET | Freq: Every day | ORAL | Status: DC

## 2014-02-04 NOTE — Progress Notes (Signed)
Subjective:    Patient ID: David Bolton, male    DOB: 1941/01/13, 73 y.o.   MRN: [id number]  HPI  Here to f/u; overall doing ok,  Pt denies chest pain, increased sob or doe, wheezing, orthopnea, PND, increased LE swelling, palpitations, dizziness or syncope.  Pt denies polydipsia, polyuria, or low sugar symptoms such as weakness or confusion improved with po intake.  Pt denies new neurological symptoms such as new headache, or facial or extremity weakness or numbness.   Pt states overall good compliance with meds, has been trying to follow lower cholesterol, diabetic diet, with wt overall stable,  but little exercise however.  BP largely controlled after takes meds, and initial BP about what it is this am.  BP at home usually 199/70 later in the day.  Did have drainage from the ext hemhorroid after last visit with pain resolved, no other tx required.   Pt continues to have recurring LBP without change in severity, bowel or bladder change, fever, wt loss,  worsening LE pain/numbness/weakness, gait change or falls.  Past Medical History  Diagnosis Date  . HYPERLIPIDEMIA 06/08/2007  . ANXIETY 06/08/2007  . DEPENDENCE, ALCOHOL NEC/NOS, IN REMISSION 06/08/2007  . DEPRESSION 06/08/2007  . HYPERTENSION 06/08/2007  . ALLERGIC RHINITIS 06/08/2007  . GERD 06/08/2007  . DIVERTICULOSIS, COLON 06/08/2007  . IBS 06/08/2007  . PILONIDAL CYST 06/08/2007  . DEGENERATION, CERVICAL DISC 07/30/2007  . Cervicalgia 12/05/2007  . LOW BACK PAIN, CHRONIC 08/12/2010  . PLANTAR FASCIITIS 06/08/2007  . FATIGUE 09/10/2008  . Headache(784.0) 06/08/2007  . Cough 08/12/2010  . Nocturia 07/16/2009  . EPIGASTRIC TENDERNESS 12/05/2007  . COLONIC POLYPS, HX OF 06/08/2007  . Impaired glucose tolerance 02/10/2011   Past Surgical History  Procedure Laterality Date  . Pilonidal cyst surgury    . Bladder and prostate surgury    . Cervical fusion      reports that he has never smoked. He does not have any smokeless tobacco history on file. He  reports that he does not drink alcohol or use illicit drugs. family history includes Heart disease in his mother; Kidney disease in his other. Allergies  Allergen Reactions  . Doxycycline Hyclate   . Sulfonamide Derivatives    Current Outpatient Prescriptions on File Prior to Visit  Medication Sig Dispense Refill  . amLODipine (NORVASC) 2.5 MG tablet Take 2.5 mg by mouth daily.        Marland Kitchen aspirin EC 81 MG EC tablet Take 81 mg by mouth daily.        . Glucosamine 500 MG TABS Take by mouth.        Marland Kitchen HYDROcodone-acetaminophen (NORCO) 10-325 MG per tablet Take 1 tablet by mouth every 12 (twelve) hours as needed. To fill Oct 05, 2013  60 tablet  0  . lansoprazole (PREVACID SOLUTAB) 30 MG disintegrating tablet Take 30 mg by mouth daily.        Marland Kitchen lidocaine-hydrocortisone (ANAMANTEL HC) 3-0.5 % CREA Place 1 Applicatorful rectally 2 (two) times daily.  28.35 g  1  . lisinopril (PRINIVIL,ZESTRIL) 40 MG tablet TAKE 1 TABLET BY MOUTH ONCE A DAY  90 tablet  1  . metFORMIN (GLUCOPHAGE-XR) 500 MG 24 hr tablet Take 1 tablet (500 mg total) by mouth daily with breakfast.  90 tablet  3  . metoprolol succinate (TOPROL-XL) 50 MG 24 hr tablet TAKE 1/2 TABLET BY MOUTH DAILY  90 tablet  3  . Multiple Vitamin (MULTIVITAMIN) capsule Take 1 capsule by mouth daily.        Marland Kitchen  OMEGA 3 340 MG CPDR Take by mouth daily.        Marland Kitchen atorvastatin (LIPITOR) 20 MG tablet Take 1 tablet (20 mg total) by mouth daily.  90 tablet  3   No current facility-administered medications on file prior to visit.   Review of Systems  Constitutional: Negative for unexpected weight change, or unusual diaphoresis  HENT: Negative for tinnitus.   Eyes: Negative for photophobia and visual disturbance.  Respiratory: Negative for choking and stridor.   Gastrointestinal: Negative for vomiting and blood in stool.  Genitourinary: Negative for hematuria and decreased urine volume.  Musculoskeletal: Negative for acute joint swelling Skin: Negative for  color change and wound.  Neurological: Negative for tremors and numbness other than noted  Psychiatric/Behavioral: Negative for decreased concentration or  hyperactivity.       Objective:   Physical Exam BP 152/92  Pulse 64  Temp(Src) 98.4 F (36.9 C) (Oral)  Ht 5\' 10"  (1.778 m)  Wt 196 lb 6 oz (89.075 kg)  BMI 28.18 kg/m2  SpO2 97% VS noted,  Constitutional: Pt appears well-developed and well-nourished.  HENT: Head: NCAT.  Right Ear: External ear normal.  Left Ear: External ear normal.  Eyes: Conjunctivae and EOM are normal. Pupils are equal, round, and reactive to light.  Neck: Normal range of motion. Neck supple.  Cardiovascular: Normal rate and regular rhythm.   Pulmonary/Chest: Effort normal and breath sounds normal.  Abd:  Soft, NT, non-distended, + BS Neurological: Pt is alert. Not confused  Skin: Skin is warm. No erythema.  Psychiatric: Pt behavior is normal. Thought content normal.     Assessment & Plan:

## 2014-02-04 NOTE — Progress Notes (Signed)
Pre visit review using our clinic review tool, if applicable. No additional management support is needed unless otherwise documented below in the visit note. 

## 2014-02-04 NOTE — Assessment & Plan Note (Signed)
stable overall by history and exam, recent data reviewed with pt, and pt to continue medical treatment as before,  to f/u any worsening symptoms or concerns Lab Results  Component Value Date   HGBA1C 6.9* 08/06/2013

## 2014-02-04 NOTE — Assessment & Plan Note (Signed)
stable overall by history and exam, recent data reviewed with pt, and pt to continue medical treatment as before,  to f/u any worsening symptoms or concerns Lab Results  Component Value Date   LDLCALC 65 08/06/2013

## 2014-02-04 NOTE — Assessment & Plan Note (Signed)
stable overall by history and exam, recent data reviewed with pt, and pt to continue medical treatment as before,  to f/u any worsening symptoms or concerns BP Readings from Last 3 Encounters:  02/04/14 152/92  12/18/13 162/92  08/28/13 140/90

## 2014-02-04 NOTE — Patient Instructions (Signed)
Please continue all other medications as before, and refills have been done if requested. Please have the pharmacy call with any other refills you may need.  Please continue your efforts at being more active, low cholesterol diet, and weight control.  You are otherwise up to date with prevention measures today.  Please go to the LAB in the Basement (turn left off the elevator) for the tests to be done today  You will be contacted by phone if any changes need to be made immediately.  Otherwise, you will receive a letter about your results with an explanation, but please check with MyChart first.  Please return in 6 months, or sooner if needed 

## 2014-02-04 NOTE — Assessment & Plan Note (Signed)
Ok to cont qhs prn hydrocodone,  to f/u any worsening symptoms or concerns

## 2014-05-30 ENCOUNTER — Other Ambulatory Visit: Payer: Self-pay | Admitting: Internal Medicine

## 2014-06-01 ENCOUNTER — Telehealth: Payer: Self-pay | Admitting: Internal Medicine

## 2014-06-01 ENCOUNTER — Other Ambulatory Visit: Payer: Self-pay | Admitting: Internal Medicine

## 2014-06-01 NOTE — Telephone Encounter (Signed)
Pt came by to request Rx refill for HYDROCODON. Pt states he has run out and his next appt is scheduled for 08/12/2014. Please contact pt when request is reviewed.

## 2014-06-02 MED ORDER — HYDROCODONE-ACETAMINOPHEN 10-325 MG PO TABS: 1.0000 | ORAL_TABLET | Freq: Every evening | ORAL | Status: DC | PRN

## 2014-06-02 NOTE — Telephone Encounter (Signed)
Done hardcopy to [facility]  

## 2014-06-02 NOTE — Telephone Encounter (Signed)
Called the patient informed hardcopy is ready for pickup at the front desk. (Left detailed message)

## 2014-06-04 ENCOUNTER — Telehealth: Payer: Self-pay | Admitting: *Deleted

## 2014-06-04 MED ORDER — HYDROCODONE-ACETAMINOPHEN 10-325 MG PO TABS: 1.0000 | ORAL_TABLET | Freq: Every evening | ORAL | Status: DC | PRN

## 2014-06-04 NOTE — Telephone Encounter (Signed)
Done hardcopy to [facility]  

## 2014-06-04 NOTE — Telephone Encounter (Signed)
Pt states md normally gives him 3 rx's on his hydrocodone to last for 3 months. At ov on Monday he only gave #30. Pt states he lives in [place] and cost him to come to office every month. Wanting script for Sept & Oct he stated his cpx is in Oct.../lmb

## 2014-06-04 NOTE — Telephone Encounter (Signed)
Called pt no answer LMOM rx ready for pick-up.../lmb 

## 2014-06-17 ENCOUNTER — Encounter: Payer: Self-pay | Admitting: Internal Medicine

## 2014-07-01 ENCOUNTER — Telehealth: Payer: Self-pay | Admitting: *Deleted

## 2014-07-01 MED ORDER — HYDROCODONE-ACETAMINOPHEN 10-325 MG PO TABS: 1.0000 | ORAL_TABLET | Freq: Every evening | ORAL | Status: DC | PRN

## 2014-07-01 NOTE — Telephone Encounter (Signed)
Left msg on triage requesting refill on his hydrocodone.../lmb 

## 2014-07-01 NOTE — Telephone Encounter (Signed)
Called pt no answer LMOM with md response...David Bolton

## 2014-07-01 NOTE — Telephone Encounter (Signed)
This would be too soon, as i believe he was given 3 mo worth (3 dated )prescriptoins at last refill

## 2014-07-01 NOTE — Telephone Encounter (Signed)
Pt called back and stated when he was in the office in April md gave him 3 scripts on his hydrocodone which was for May, Jun & July. When he call for august he only gave him 1 script was told he would have to call each month to get prescription.Marland KitchenJohny Bolton

## 2014-07-01 NOTE — Telephone Encounter (Signed)
On aug 6, 3 dated prescriptions were prescribed, documented on the record, with the 3rd to be filled oct 3 , 2015  I am not sure why he only has one.  Will go ahead and refill monthly for now. Thanks - Done hardcopy to [employer]

## 2014-07-02 NOTE — Telephone Encounter (Signed)
Called pt no answer LMOM rx ready for pick-up.../lmb 

## 2014-07-15 ENCOUNTER — Telehealth: Payer: Self-pay

## 2014-07-15 ENCOUNTER — Ambulatory Visit: Payer: Medicare Other

## 2014-07-15 VITALS — BP 152/100

## 2014-07-15 DIAGNOSIS — Z0130 Encounter for examination of blood pressure without abnormal findings: Secondary | ICD-10-CM

## 2014-07-15 NOTE — Telephone Encounter (Signed)
[place] for [employer], And bring any BP machine the pt has at home

## 2014-07-15 NOTE — Progress Notes (Signed)
Pre visit review using our clinic review tool, if applicable. No additional management support is needed unless otherwise documented below in the visit note.   Did 3 BP reading in the office during the visit.   152/100 160/104 158/98

## 2014-07-16 NOTE — Telephone Encounter (Signed)
Patient has an appt scheduled for 10/14.  He would prefer to wait until that day to come in. He took his bp this morning it was 135/75.  He felt like his bp was elevated yesterday bc he was stressed.

## 2014-07-31 ENCOUNTER — Telehealth: Payer: Self-pay | Admitting: *Deleted

## 2014-07-31 NOTE — Telephone Encounter (Signed)
Pt called requested refill on his hydrocodone 06/04/14. MD had filled for Sept & Oct. Pt never picked up scripts. Voiding them...David Bolton

## 2014-08-12 ENCOUNTER — Ambulatory Visit (INDEPENDENT_AMBULATORY_CARE_PROVIDER_SITE_OTHER): Payer: Medicare Other | Admitting: Internal Medicine

## 2014-08-12 ENCOUNTER — Other Ambulatory Visit (INDEPENDENT_AMBULATORY_CARE_PROVIDER_SITE_OTHER): Payer: Medicare Other

## 2014-08-12 ENCOUNTER — Encounter: Payer: Self-pay | Admitting: Internal Medicine

## 2014-08-12 VITALS — BP 182/102 | HR 57 | Temp 97.8°F | Wt 190.0 lb

## 2014-08-12 DIAGNOSIS — M545 Low back pain: Secondary | ICD-10-CM

## 2014-08-12 DIAGNOSIS — N320 Bladder-neck obstruction: Secondary | ICD-10-CM | POA: Diagnosis not present

## 2014-08-12 DIAGNOSIS — Z23 Encounter for immunization: Secondary | ICD-10-CM | POA: Diagnosis not present

## 2014-08-12 DIAGNOSIS — F411 Generalized anxiety disorder: Secondary | ICD-10-CM

## 2014-08-12 DIAGNOSIS — E785 Hyperlipidemia, unspecified: Secondary | ICD-10-CM | POA: Diagnosis not present

## 2014-08-12 DIAGNOSIS — K219 Gastro-esophageal reflux disease without esophagitis: Secondary | ICD-10-CM

## 2014-08-12 DIAGNOSIS — E119 Type 2 diabetes mellitus without complications: Secondary | ICD-10-CM

## 2014-08-12 DIAGNOSIS — I10 Essential (primary) hypertension: Secondary | ICD-10-CM

## 2014-08-12 LAB — URINALYSIS, ROUTINE W REFLEX MICROSCOPIC
BILIRUBIN URINE: NEGATIVE
Hgb urine dipstick: NEGATIVE
Ketones, ur: NEGATIVE
Leukocytes, UA: NEGATIVE
NITRITE: NEGATIVE
PH: 6 (ref 5.0–8.0)
RBC / HPF: NONE SEEN (ref 0–?)
Specific Gravity, Urine: 1.025 (ref 1.000–1.030)
Total Protein, Urine: NEGATIVE
URINE GLUCOSE: NEGATIVE
Urobilinogen, UA: 0.2 (ref 0.0–1.0)

## 2014-08-12 LAB — CBC WITH DIFFERENTIAL/PLATELET
Basophils Absolute: 0 10*3/uL (ref 0.0–0.1)
Basophils Relative: 0.7 % (ref 0.0–3.0)
EOS ABS: 0.1 10*3/uL (ref 0.0–0.7)
EOS PCT: 2.4 % (ref 0.0–5.0)
HCT: 44.8 % (ref 39.0–52.0)
Hemoglobin: 15.1 g/dL (ref 13.0–17.0)
LYMPHS ABS: 1.8 10*3/uL (ref 0.7–4.0)
Lymphocytes Relative: 29.9 % (ref 12.0–46.0)
MCHC: 33.7 g/dL (ref 30.0–36.0)
MCV: 90.8 fl (ref 78.0–100.0)
Monocytes Absolute: 0.6 10*3/uL (ref 0.1–1.0)
Monocytes Relative: 9.6 % (ref 3.0–12.0)
NEUTROS ABS: 3.5 10*3/uL (ref 1.4–7.7)
NEUTROS PCT: 57.4 % (ref 43.0–77.0)
Platelets: 246 10*3/uL (ref 150.0–400.0)
RBC: 4.94 Mil/uL (ref 4.22–5.81)
RDW: 12.6 % (ref 11.5–15.5)
WBC: 6 10*3/uL (ref 4.0–10.5)

## 2014-08-12 LAB — LIPID PANEL
Cholesterol: 133 mg/dL (ref 0–200)
HDL: 43.6 mg/dL
LDL Cholesterol: 70 mg/dL (ref 0–99)
NonHDL: 89.4
Total CHOL/HDL Ratio: 3
Triglycerides: 96 mg/dL (ref 0.0–149.0)
VLDL: 19.2 mg/dL (ref 0.0–40.0)

## 2014-08-12 LAB — HEPATIC FUNCTION PANEL
ALT: 28 U/L (ref 0–53)
AST: 43 U/L — ABNORMAL HIGH (ref 0–37)
Albumin: 3.9 g/dL (ref 3.5–5.2)
Alkaline Phosphatase: 57 U/L (ref 39–117)
Bilirubin, Direct: 0.3 mg/dL (ref 0.0–0.3)
Total Bilirubin: 1.3 mg/dL — ABNORMAL HIGH (ref 0.2–1.2)
Total Protein: 7.8 g/dL (ref 6.0–8.3)

## 2014-08-12 LAB — MICROALBUMIN / CREATININE URINE RATIO
Creatinine,U: 218.1 mg/dL
Microalb Creat Ratio: 1.2 mg/g (ref 0.0–30.0)
Microalb, Ur: 2.7 mg/dL — ABNORMAL HIGH (ref 0.0–1.9)

## 2014-08-12 LAB — BASIC METABOLIC PANEL WITH GFR
BUN: 13 mg/dL (ref 6–23)
CO2: 27 meq/L (ref 19–32)
Calcium: 9.5 mg/dL (ref 8.4–10.5)
Chloride: 102 meq/L (ref 96–112)
Creatinine, Ser: 0.9 mg/dL (ref 0.4–1.5)
GFR: 93.21 mL/min
Glucose, Bld: 121 mg/dL — ABNORMAL HIGH (ref 70–99)
Potassium: 4.3 meq/L (ref 3.5–5.1)
Sodium: 136 meq/L (ref 135–145)

## 2014-08-12 LAB — TSH: TSH: 1.6 u[IU]/mL (ref 0.35–4.50)

## 2014-08-12 LAB — HEMOGLOBIN A1C: Hgb A1c MFr Bld: 6.8 % — ABNORMAL HIGH (ref 4.6–6.5)

## 2014-08-12 LAB — PSA: PSA: 1.99 ng/mL (ref 0.10–4.00)

## 2014-08-12 MED ORDER — HYDROCODONE-ACETAMINOPHEN 10-325 MG PO TABS: 1.0000 | ORAL_TABLET | Freq: Every evening | ORAL | Status: DC | PRN

## 2014-08-12 MED ORDER — DULOXETINE HCL 30 MG PO CPEP: 30.0000 mg | ORAL_CAPSULE | Freq: Every day | ORAL | Status: DC

## 2014-08-12 MED ORDER — AMLODIPINE BESYLATE 5 MG PO TABS: 5.0000 mg | ORAL_TABLET | Freq: Every day | ORAL | Status: DC

## 2014-08-12 MED ORDER — RANITIDINE HCL 150 MG PO TABS: 150.0000 mg | ORAL_TABLET | Freq: Every day | ORAL | Status: DC | PRN

## 2014-08-12 NOTE — Assessment & Plan Note (Signed)
For norco refill, limited rx,  to f/u any worsening symptoms or concerns

## 2014-08-12 NOTE — Progress Notes (Signed)
Subjective:    Patient ID: David Bolton, male    DOB: August 25, 1941, 73 y.o.   MRN: [id number]  HPI   Here to f/u; overall doing ok,  Pt denies chest pain, increased sob or doe, wheezing, orthopnea, PND, increased LE swelling, palpitations, dizziness or syncope.  Pt denies polydipsia, polyuria, or low sugar symptoms such as weakness or confusion improved with po intake.  Pt denies new neurological symptoms such as new headache, or facial or extremity weakness or numbness.   Pt states overall good compliance with meds, has been trying to follow lower cholesterol, diabetic diet, with wt overall stable,  but little exercise however.  Overall pain somewhat uncontrolled, but willing to stick with the hydrocodone 1 qhs prn, as this is the time of most chronic lbp. Pt continues to have recurring LBP and neck pain without change in severity, bowel or bladder change, fever, wt loss,  worsening LE pain/numbness/weakness, gait change or falls.  Pt notes BP has been higher in the AM x 2 wks, ? Related to poor sleep.  Takes meds and gets some exercise and overall feels better, Does a type of elliptical machine occasionally, not as much the last 2 wks. Also feels better to rake leaves for 2 hrs.  BP tends to be lower after activity liek this as well, as low as 122 sbp.  BP at home 160/100 in am, but better later in the day.  Also mentions reflux somewhat worse recentloy, ? More stress related, but zantac 150 otc helps. Denies worsening depressive symptoms, suicidal ideation, or panic; has ongoing anxiety   Past Medical History  Diagnosis Date  . HYPERLIPIDEMIA 06/08/2007  . ANXIETY 06/08/2007  . DEPENDENCE, ALCOHOL NEC/NOS, IN REMISSION 06/08/2007  . DEPRESSION 06/08/2007  . HYPERTENSION 06/08/2007  . ALLERGIC RHINITIS 06/08/2007  . GERD 06/08/2007  . DIVERTICULOSIS, COLON 06/08/2007  . IBS 06/08/2007  . PILONIDAL CYST 06/08/2007  . DEGENERATION, CERVICAL DISC 07/30/2007  . Cervicalgia 12/05/2007  . LOW BACK PAIN, CHRONIC  08/12/2010  . PLANTAR FASCIITIS 06/08/2007  . FATIGUE 09/10/2008  . Headache(784.0) 06/08/2007  . Cough 08/12/2010  . Nocturia 07/16/2009  . EPIGASTRIC TENDERNESS 12/05/2007  . COLONIC POLYPS, HX OF 06/08/2007  . Impaired glucose tolerance 02/10/2011   Past Surgical History  Procedure Laterality Date  . Pilonidal cyst surgury    . Bladder and prostate surgury    . Cervical fusion      reports that he has never smoked. He does not have any smokeless tobacco history on file. He reports that he does not drink alcohol or use illicit drugs. family history includes Heart disease in his mother; Kidney disease in his other. Allergies  Allergen Reactions  . Doxycycline Hyclate   . Sulfonamide Derivatives    Current Outpatient Prescriptions on File Prior to Visit  Medication Sig Dispense Refill  . amLODipine (NORVASC) 2.5 MG tablet Take 2.5 mg by mouth daily.        Marland Kitchen aspirin EC 81 MG EC tablet Take 81 mg by mouth daily.        Marland Kitchen atorvastatin (LIPITOR) 20 MG tablet Take 1 tablet (20 mg total) by mouth daily.  90 tablet  3  . Glucosamine 500 MG TABS Take by mouth.        Marland Kitchen HYDROcodone-acetaminophen (NORCO) 10-325 MG per tablet Take 1 tablet by mouth at bedtime as needed. To fill sept 2, 2015  30 tablet  0  . lansoprazole (PREVACID SOLUTAB) 30 MG disintegrating tablet  Take 30 mg by mouth daily.        Marland Kitchen lidocaine-hydrocortisone (ANAMANTEL HC) 3-0.5 % CREA Place 1 Applicatorful rectally 2 (two) times daily.  28.35 g  1  . lisinopril (PRINIVIL,ZESTRIL) 40 MG tablet TAKE 1 TABLET BY MOUTH ONCE A DAY  90 tablet  1  . metFORMIN (GLUCOPHAGE-XR) 500 MG 24 hr tablet 2 tabs by mouth per day  180 tablet  3  . metoprolol succinate (TOPROL-XL) 50 MG 24 hr tablet TAKE 1/2 TABLET BY MOUTH DAILY  90 tablet  3  . Multiple Vitamin (MULTIVITAMIN) capsule Take 1 capsule by mouth daily.        . OMEGA 3 340 MG CPDR Take by mouth daily.         No current facility-administered medications on file prior to visit.    Review of Systems  Constitutional: Negative for unusual diaphoresis or other sweats  HENT: Negative for ringing in ear Eyes: Negative for double vision or worsening visual disturbance.  Respiratory: Negative for choking and stridor.   Gastrointestinal: Negative for vomiting or other signifcant bowel change Genitourinary: Negative for hematuria or decreased urine volume.  Musculoskeletal: Negative for other MSK pain or swelling Skin: Negative for color change and worsening wound.  Neurological: Negative for tremors and numbness other than noted  Psychiatric/Behavioral: Negative for decreased concentration or agitation other than above       Objective:   Physical Exam BP 182/102  Pulse 57  Temp(Src) 97.8 F (36.6 C) (Oral)  Wt 190 lb (86.183 kg)  SpO2 97% VS noted,  Constitutional: Pt appears well-developed, well-nourished.  HENT: Head: NCAT.  Right Ear: External ear normal.  Left Ear: External ear normal.  Eyes: . Pupils are equal, round, and reactive to light. Conjunctivae and EOM are normal Neck: Normal range of motion. Neck supple.  Cardiovascular: Normal rate and regular rhythm.   Pulmonary/Chest: Effort normal and breath sounds normal.  Abd:  Soft, NT, ND, + BS Neurological: Pt is alert. Not confused , motor grossly intact Skin: Skin is warm. No rash Psychiatric: Pt behavior is normal. No agitation. mild nervous    Assessment & Plan:

## 2014-08-12 NOTE — Patient Instructions (Addendum)
You had the flu shot today  OK to increase the amlodipine to 5 mg per day  Please take all new medication as prescribed - the cymbalta 30 mg per day  OK to continue the zantac 150 mg daily as needed OTC  Please continue all other medications as before, including the pain medication  Please have the pharmacy call with any other refills you may need.  Please continue your efforts at being more active, low cholesterol diet, and weight control.  You are otherwise up to date with prevention measures today.  Please keep your appointments with your specialists as you may have planned  Please go to the LAB in the Basement (turn left off the elevator) for the tests to be done today  You will be contacted by phone if any changes need to be made immediately.  Otherwise, you will receive a letter about your results with an explanation, but please check with MyChart first.  Please remember to sign up for MyChart if you have not done so, as this will be important to you in the future with finding out test results, communicating by private email, and scheduling acute appointments online when needed.  Please return in 6 months, or sooner if needed

## 2014-08-12 NOTE — Assessment & Plan Note (Signed)
stable overall by history and exam, recent data reviewed with pt, and pt to continue medical treatment as before,  to f/u any worsening symptoms or concerns Lab Results  Component Value Date   [place] 74 02/04/2014   For f/u lab today

## 2014-08-12 NOTE — Assessment & Plan Note (Signed)
Also for cymbalta 30 qd, may help with pain as well,  to f/u any worsening symptoms or concerns

## 2014-08-12 NOTE — Assessment & Plan Note (Signed)
Mild elev recently o/w stable overall by history and exam, recent data revewed with pt, and pt to increase the amlod to 5 qd,  to f/u any worsening symptoms or concerns BP Readings from Last 3 Encounters:  08/12/14 182/102  07/15/14 152/100  02/04/14 152/92

## 2014-08-12 NOTE — Assessment & Plan Note (Signed)
stable overall by history and exam, recent data reviewed with pt, and pt to continue medical treatment as before,  to f/u any worsening symptoms or concerns Lab Results  Component Value Date   HGBA1C 7.4* 02/04/2014

## 2014-08-12 NOTE — Progress Notes (Signed)
Pre visit review using our clinic review tool, if applicable. No additional management support is needed unless otherwise documented below in the visit note. 

## 2014-08-12 NOTE — Assessment & Plan Note (Signed)
Stable on new zantac 150 qd prn,  to f/u any worsening symptoms or concerns

## 2014-08-14 ENCOUNTER — Other Ambulatory Visit: Payer: Self-pay

## 2014-09-30 ENCOUNTER — Ambulatory Visit (INDEPENDENT_AMBULATORY_CARE_PROVIDER_SITE_OTHER): Payer: Medicare Other | Admitting: Family

## 2014-09-30 ENCOUNTER — Encounter: Payer: Self-pay | Admitting: Family

## 2014-09-30 VITALS — BP 170/100 | HR 63 | Temp 98.8°F | Resp 18 | Ht 70.0 in | Wt 188.8 lb

## 2014-09-30 DIAGNOSIS — H60392 Other infective otitis externa, left ear: Secondary | ICD-10-CM

## 2014-09-30 DIAGNOSIS — H60399 Other infective otitis externa, unspecified ear: Secondary | ICD-10-CM | POA: Insufficient documentation

## 2014-09-30 MED ORDER — NEOMYCIN-POLYMYXIN-HC 3.5-10000-1 OT SOLN: 4.0000 [drp] | Freq: Four times a day (QID) | OTIC | Status: DC

## 2014-09-30 MED ORDER — CIPROFLOXACIN HCL 500 MG PO TABS: 500.0000 mg | ORAL_TABLET | Freq: Two times a day (BID) | ORAL | Status: DC

## 2014-09-30 NOTE — Progress Notes (Signed)
Pre visit review using our clinic review tool, if applicable. No additional management support is needed unless otherwise documented below in the visit note. 

## 2014-09-30 NOTE — Patient Instructions (Signed)
Thank you for choosing [facility].  Summary/Instructions:  Your prescription(s) have been submitted to your pharmacy. Please take as directed and contact our office if you believe you are having problem(s) with the medication(s).  If your symptoms worsen or fail to improve, please contact our office for further instruction, or in case of emergency go directly to the emergency room at the closest medical facility.    Otitis Externa Otitis externa is a bacterial or fungal infection of the outer ear canal. This is the area from the eardrum to the outside of the ear. Otitis externa is sometimes called "swimmer's ear." CAUSES  Possible causes of infection include:  Swimming in dirty water.  Moisture remaining in the ear after swimming or bathing.  Mild injury (trauma) to the ear.  Objects stuck in the ear (foreign body).  Cuts or scrapes (abrasions) on the outside of the ear. SIGNS AND SYMPTOMS  The first symptom of infection is often itching in the ear canal. Later signs and symptoms may include swelling and redness of the ear canal, ear pain, and yellowish-white fluid (pus) coming from the ear. The ear pain may be worse when pulling on the earlobe. DIAGNOSIS  Your health care provider will perform a physical exam. A sample of fluid may be taken from the ear and examined for bacteria or fungi. TREATMENT  Antibiotic ear drops are often given for 10 to 14 days. Treatment may also include pain medicine or corticosteroids to reduce itching and swelling. HOME CARE INSTRUCTIONS   Apply antibiotic ear drops to the ear canal as prescribed by your health care provider.  Take medicines only as directed by your health care provider.  If you have diabetes, follow any additional treatment instructions from your health care provider.  Keep all follow-up visits as directed by your health care provider. PREVENTION   Keep your ear dry. Use the corner of a towel to absorb water out of the  ear canal after swimming or bathing.  Avoid scratching or putting objects inside your ear. This can damage the ear canal or remove the protective wax that lines the canal. This makes it easier for bacteria and fungi to grow.  Avoid swimming in lakes, polluted water, or poorly chlorinated pools.  You may use ear drops made of rubbing alcohol and vinegar after swimming. Combine equal parts of white vinegar and alcohol in a bottle. Put 3 or 4 drops into each ear after swimming. SEEK MEDICAL CARE IF:   You have a fever.  Your ear is still red, swollen, painful, or draining pus after 3 days.  Your redness, swelling, or pain gets worse.  You have a severe headache.  You have redness, swelling, pain, or tenderness in the area behind your ear. MAKE SURE YOU:   Understand these instructions.  Will watch your condition.  Will get help right away if you are not doing well or get worse. Document Released: 10/16/2005 Document Revised: 03/02/2014 Document Reviewed: 11/02/2011 [facility] Patient Information 2015 [place], [place]. This information is not intended to replace advice given to you by your health care provider. Make sure you discuss any questions you have with your health care provider.

## 2014-09-30 NOTE — Assessment & Plan Note (Signed)
Symptoms and exam consistent with otitis externa. Start Cortisporin-4 drops in right ear 4 times per day. Start Cipro 500 mg twice a day. May continue to use 800 mg of ibuprofen as needed for pain. Follow-up if symptoms worsen or fail to improve.

## 2014-09-30 NOTE — Progress Notes (Signed)
Subjective:    Patient ID: David Bolton, male    DOB: 09-Sep-1941, 73 y.o.   MRN: [id number]  Chief Complaint  Patient presents with  . Ear Pain    left ear pain with drainage and itching x1 week    HPI:  [name] is a 73 y.o. male who presents today for ear pain.  Acute symptoms of left ear pain started about one week ago. Describes itching and throbbing and sensations like there was a foreign object. Started discharging this morning and indicates that "you cannot get anything in it."  Describes external ear pain as well. Has not performed any treatments for it. Denies anything that makes it better or worse. Denies any fevers.  Allergies  Allergen Reactions  . Doxycycline Hyclate   . Sulfonamide Derivatives    Current Outpatient Prescriptions on File Prior to Visit  Medication Sig Dispense Refill  . amLODipine (NORVASC) 5 MG tablet Take 1 tablet (5 mg total) by mouth daily. 90 tablet 3  . aspirin EC 81 MG EC tablet Take 81 mg by mouth daily.      Marland Kitchen atorvastatin (LIPITOR) 20 MG tablet Take 1 tablet (20 mg total) by mouth daily. 90 tablet 3  . DULoxetine (CYMBALTA) 30 MG capsule Take 1 capsule (30 mg total) by mouth daily. 90 capsule 3  . Glucosamine 500 MG TABS Take by mouth.      Marland Kitchen HYDROcodone-acetaminophen (NORCO) 10-325 MG per tablet Take 1 tablet by mouth at bedtime as needed. To fill Oct 11, 2014 30 tablet 0  . lansoprazole (PREVACID SOLUTAB) 30 MG disintegrating tablet Take 30 mg by mouth daily.      Marland Kitchen lidocaine-hydrocortisone (ANAMANTEL HC) 3-0.5 % CREA Place 1 Applicatorful rectally 2 (two) times daily. 28.35 g 1  . lisinopril (PRINIVIL,ZESTRIL) 40 MG tablet TAKE 1 TABLET BY MOUTH ONCE A DAY 90 tablet 1  . metFORMIN (GLUCOPHAGE-XR) 500 MG 24 hr tablet 2 tabs by mouth per day 180 tablet 3  . metoprolol succinate (TOPROL-XL) 50 MG 24 hr tablet TAKE 1/2 TABLET BY MOUTH DAILY 90 tablet 3  . Multiple Vitamin (MULTIVITAMIN) capsule Take 1 capsule by mouth daily.        . OMEGA 3 340 MG CPDR Take by mouth daily.      . ranitidine (ZANTAC) 150 MG tablet Take 1 tablet (150 mg total) by mouth daily as needed for heartburn. 90 tablet 3   No current facility-administered medications on file prior to visit.    Review of Systems  See HPI    Objective:    BP 170/100 mmHg  Pulse 63  Temp(Src) 98.8 F (37.1 C) (Oral)  Resp 18  Ht 5\' 10"  (1.778 m)  Wt 188 lb 12.8 oz (85.639 kg)  BMI 27.09 kg/m2  SpO2 97% Nursing note and vital signs reviewed.  Physical Exam  Constitutional: He is oriented to person, place, and time. He appears well-developed and well-nourished. No distress.  HENT:  Right Ear: Hearing, tympanic membrane, external ear and ear canal normal.  Left Ear: Hearing normal. There is swelling and tenderness.  Left ear appears slightly reddened. Unable to view tympanic membrane of left ear secondary to pain and inflammation.  Cardiovascular: Normal rate, regular rhythm, normal heart sounds and intact distal pulses.   Pulmonary/Chest: Effort normal and breath sounds normal.  Lymphadenopathy:    He has no cervical adenopathy.  Neurological: He is alert and oriented to person, place, and time.  Skin: Skin is warm  and dry.  Psychiatric: He has a normal mood and affect. His behavior is normal. Judgment and thought content normal.       Assessment & Plan:

## 2014-11-09 ENCOUNTER — Telehealth: Payer: Self-pay | Admitting: Internal Medicine

## 2014-11-09 NOTE — Telephone Encounter (Signed)
Pt request for hydrocodone for 3 month supply, pt stated it need to be Jan,Feb and March. Please advise, pt stated that Dr. Jenny Reichmann told him to come in January to get this 3 month supply. Please call pt  Phone [phone]

## 2014-11-10 MED ORDER — HYDROCODONE-ACETAMINOPHEN 10-325 MG PO TABS: 1.0000 | ORAL_TABLET | Freq: Every evening | ORAL | Status: DC | PRN

## 2014-11-10 NOTE — Telephone Encounter (Signed)
Left detailed message for patient informing her that scripts are up front for pick up. JG//CMA

## 2014-11-10 NOTE — Telephone Encounter (Signed)
Done hardcopy to [employer]

## 2014-12-15 ENCOUNTER — Other Ambulatory Visit: Payer: Self-pay | Admitting: Internal Medicine

## 2015-02-01 ENCOUNTER — Other Ambulatory Visit: Payer: Self-pay | Admitting: Internal Medicine

## 2015-02-11 ENCOUNTER — Other Ambulatory Visit: Payer: Medicare Other

## 2015-02-11 ENCOUNTER — Telehealth: Payer: Self-pay

## 2015-02-11 ENCOUNTER — Ambulatory Visit (INDEPENDENT_AMBULATORY_CARE_PROVIDER_SITE_OTHER): Payer: Medicare Other | Admitting: Internal Medicine

## 2015-02-11 ENCOUNTER — Encounter: Payer: Self-pay | Admitting: Internal Medicine

## 2015-02-11 VITALS — BP 136/82 | HR 56 | Temp 98.6°F | Resp 18 | Ht 70.0 in | Wt 188.0 lb

## 2015-02-11 DIAGNOSIS — E119 Type 2 diabetes mellitus without complications: Secondary | ICD-10-CM

## 2015-02-11 DIAGNOSIS — I10 Essential (primary) hypertension: Secondary | ICD-10-CM | POA: Diagnosis not present

## 2015-02-11 DIAGNOSIS — M545 Low back pain: Secondary | ICD-10-CM | POA: Diagnosis not present

## 2015-02-11 DIAGNOSIS — E785 Hyperlipidemia, unspecified: Secondary | ICD-10-CM

## 2015-02-11 MED ORDER — HYDROCODONE-ACETAMINOPHEN 10-325 MG PO TABS: 1.0000 | ORAL_TABLET | Freq: Every evening | ORAL | Status: DC | PRN

## 2015-02-11 NOTE — Assessment & Plan Note (Signed)
stable overall by history and exam, recent data reviewed with pt, and pt to continue medical treatment as before,  to f/u any worsening symptoms or concerns, for med refills  

## 2015-02-11 NOTE — Patient Instructions (Signed)

## 2015-02-11 NOTE — Assessment & Plan Note (Signed)
stable overall by history and exam, recent data reviewed with pt, and pt to continue medical treatment as before,  to f/u any worsening symptoms or concerns Lab Results  Component Value Date   HGBA1C 6.8* 08/12/2014

## 2015-02-11 NOTE — Progress Notes (Signed)
Subjective:    Patient ID: David Bolton, male    DOB: [birth year], 74 y.o.   MRN: [id number]  HPI  Here to f/u; overall doing ok,  Pt denies chest pain, increasing sob or doe, wheezing, orthopnea, PND, increased LE swelling, palpitations, dizziness or syncope.  Pt denies new neurological symptoms such as new headache, or facial or extremity weakness or numbness.  Pt denies polydipsia, polyuria, or low sugar episode.   Pt denies new neurological symptoms such as new headache, or facial or extremity weakness or numbness.   Pt states overall good compliance with meds, mostly trying to follow appropriate diet, with wt overall stable,  but little exercise however.  No current complaints. Pt continues to have recurring LBP without change in severity, bowel or bladder change, fever, wt loss,  worsening LE pain/numbness/weakness, gait change or falls. Past Medical History  Diagnosis Date  . HYPERLIPIDEMIA 06/08/2007  . ANXIETY 06/08/2007  . DEPENDENCE, ALCOHOL NEC/NOS, IN REMISSION 06/08/2007  . DEPRESSION 06/08/2007  . HYPERTENSION 06/08/2007  . ALLERGIC RHINITIS 06/08/2007  . GERD 06/08/2007  . DIVERTICULOSIS, COLON 06/08/2007  . IBS 06/08/2007  . PILONIDAL CYST 06/08/2007  . DEGENERATION, CERVICAL DISC 07/30/2007  . Cervicalgia 12/05/2007  . LOW BACK PAIN, CHRONIC 08/12/2010  . PLANTAR FASCIITIS 06/08/2007  . FATIGUE 09/10/2008  . Headache(784.0) 06/08/2007  . Cough 08/12/2010  . Nocturia 07/16/2009  . EPIGASTRIC TENDERNESS 12/05/2007  . COLONIC POLYPS, HX OF 06/08/2007  . Impaired glucose tolerance 02/10/2011   Past Surgical History  Procedure Laterality Date  . Pilonidal cyst surgury    . Bladder and prostate surgury    . Cervical fusion      reports that he has never smoked. He does not have any smokeless tobacco history on file. He reports that he does not drink alcohol or use illicit drugs. family history includes Heart disease in his mother; Kidney disease in his other. Allergies  Allergen Reactions  .  Doxycycline Hyclate   . Sulfonamide Derivatives    Current Outpatient Prescriptions on File Prior to Visit  Medication Sig Dispense Refill  . amLODipine (NORVASC) 5 MG tablet Take 1 tablet (5 mg total) by mouth daily. 90 tablet 3  . aspirin EC 81 MG EC tablet Take 81 mg by mouth daily.      . ciprofloxacin (CIPRO) 500 MG tablet Take 1 tablet (500 mg total) by mouth 2 (two) times daily. 14 tablet 0  . DULoxetine (CYMBALTA) 30 MG capsule Take 1 capsule (30 mg total) by mouth daily. 90 capsule 3  . Glucosamine 500 MG TABS Take by mouth.      Marland Kitchen HYDROcodone-acetaminophen (NORCO) 10-325 MG per tablet Take 1 tablet by mouth at bedtime as needed. To fill Jan 07, 2015 30 tablet 0  . lansoprazole (PREVACID SOLUTAB) 30 MG disintegrating tablet Take 30 mg by mouth daily.      Marland Kitchen lidocaine-hydrocortisone (ANAMANTEL HC) 3-0.5 % CREA Place 1 Applicatorful rectally 2 (two) times daily. 28.35 g 1  . lisinopril (PRINIVIL,ZESTRIL) 40 MG tablet TAKE 1 TABLET BY MOUTH ONCE A DAY 90 tablet 2  . metFORMIN (GLUCOPHAGE-XR) 500 MG 24 hr tablet TAKE 1 TABLET BY MOUTH DAILY WITH BREAKFAST. 90 tablet 3  . metoprolol succinate (TOPROL-XL) 50 MG 24 hr tablet TAKE 1/2 TABLET BY MOUTH DAILY 90 tablet 3  . Multiple Vitamin (MULTIVITAMIN) capsule Take 1 capsule by mouth daily.      . OMEGA 3 340 MG CPDR Take by mouth daily.      Marland Kitchen  ranitidine (ZANTAC) 150 MG tablet Take 1 tablet (150 mg total) by mouth daily as needed for heartburn. 90 tablet 3  . atorvastatin (LIPITOR) 20 MG tablet Take 1 tablet (20 mg total) by mouth daily. 90 tablet 3   No current facility-administered medications on file prior to visit.   Review of Systems  Constitutional: Negative for unusual diaphoresis or night sweats HENT: Negative for ringing in ear or discharge Eyes: Negative for double vision or worsening visual disturbance.  Respiratory: Negative for choking and stridor.   Gastrointestinal: Negative for vomiting or other signifcant bowel  change Genitourinary: Negative for hematuria or change in urine volume.  Musculoskeletal: Negative for other MSK pain or swelling Skin: Negative for color change and worsening wound.  Neurological: Negative for tremors and numbness other than noted  Psychiatric/Behavioral: Negative for decreased concentration or agitation other than above       Objective:   Physical Exam BP 136/82 mmHg  Pulse 56  Temp(Src) 98.6 F (37 C) (Oral)  Resp 18  Ht 5\' 10"  (1.778 m)  Wt 188 lb (85.276 kg)  BMI 26.98 kg/m2  SpO2 98% VS noted,  Constitutional: Pt appears in no significant distress HENT: Head: NCAT.  Right Ear: External ear normal.  Left Ear: External ear normal.  Eyes: . Pupils are equal, round, and reactive to light. Conjunctivae and EOM are normal Neck: Normal range of motion. Neck supple.  Cardiovascular: Normal rate and regular rhythm.   Pulmonary/Chest: Effort normal and breath sounds without rales or wheezing.  Abd:  Soft, NT, ND, + BS Neurological: Pt is alert. Not confused , motor grossly intact Skin: Skin is warm. No rash, no LE edema Psychiatric: Pt behavior is normal. No agitation.     Assessment & Plan:

## 2015-02-11 NOTE — Assessment & Plan Note (Signed)
stable overall by history and exam, recent data reviewed with pt, and pt to continue medical treatment as before,  to f/u any worsening symptoms or concerns BP Readings from Last 3 Encounters:  02/11/15 136/82  09/30/14 170/100  08/12/14 182/102

## 2015-02-11 NOTE — Progress Notes (Signed)
Pre visit review using our clinic review tool, if applicable. No additional management support is needed unless otherwise documented below in the visit note. 

## 2015-02-11 NOTE — Assessment & Plan Note (Signed)
stable overall by history and exam, recent data reviewed with pt, and pt to continue medical treatment as before,  to f/u any worsening symptoms or concerns Lab Results  Component Value Date   LDLCALC 70 08/12/2014

## 2015-02-11 NOTE — Telephone Encounter (Signed)
Patient was seen today and was told to go to the lab after visit. Juliann Pulse from the lab called and that no orders were put in for the patient.

## 2015-02-11 NOTE — Addendum Note (Signed)
Addended by: Biagio Borg on: 02/11/2015 12:51 PM   Modules accepted: Orders

## 2015-02-11 NOTE — Telephone Encounter (Signed)
Labs now entered  [facility] to let pt know

## 2015-02-18 ENCOUNTER — Other Ambulatory Visit (INDEPENDENT_AMBULATORY_CARE_PROVIDER_SITE_OTHER): Payer: Medicare Other

## 2015-02-18 DIAGNOSIS — E119 Type 2 diabetes mellitus without complications: Secondary | ICD-10-CM | POA: Diagnosis not present

## 2015-02-18 LAB — HEMOGLOBIN A1C: HEMOGLOBIN A1C: 6.8 % — AB (ref 4.6–6.5)

## 2015-02-18 LAB — LIPID PANEL
Cholesterol: 134 mg/dL (ref 0–200)
HDL: 54.9 mg/dL (ref >39.00)
LDL CALC: 61 mg/dL (ref 0–99)
NONHDL: 79.1
Total CHOL/HDL Ratio: 2
Triglycerides: 89 mg/dL (ref 0.0–149.0)
VLDL: 17.8 mg/dL (ref 0.0–40.0)

## 2015-02-18 LAB — HEPATIC FUNCTION PANEL
ALBUMIN: 4.3 g/dL (ref 3.5–5.2)
ALK PHOS: 68 U/L (ref 39–117)
ALT: 16 U/L (ref 0–53)
AST: 23 U/L (ref 0–37)
BILIRUBIN DIRECT: 0.2 mg/dL (ref 0.0–0.3)
TOTAL PROTEIN: 7.6 g/dL (ref 6.0–8.3)
Total Bilirubin: 1 mg/dL (ref 0.2–1.2)

## 2015-02-18 LAB — BASIC METABOLIC PANEL
BUN: 12 mg/dL (ref 6–23)
CO2: 26 mEq/L (ref 19–32)
Calcium: 9.7 mg/dL (ref 8.4–10.5)
Chloride: 102 mEq/L (ref 96–112)
Creatinine, Ser: 0.91 mg/dL (ref 0.40–1.50)
GFR: 87.2 mL/min (ref >60.00)
GLUCOSE: 140 mg/dL — AB (ref 70–99)
POTASSIUM: 4.2 meq/L (ref 3.5–5.1)
Sodium: 135 mEq/L (ref 135–145)

## 2015-02-23 ENCOUNTER — Telehealth: Payer: Self-pay | Admitting: Internal Medicine

## 2015-02-23 NOTE — Telephone Encounter (Signed)
Patient received a call this morning and I can't tell who called him. Did you call him?

## 2015-02-23 NOTE — Telephone Encounter (Signed)
Called patient back to tell him that it was not me who called him this morning. He stated that he was a recording stating that he needed to get in touch with his doctor regarding his labs. I read to him his recent lab work which he had reviewed on [place]. Perhaps it was another [employer] office that had called him.

## 2015-04-26 ENCOUNTER — Other Ambulatory Visit: Payer: Self-pay

## 2015-05-10 ENCOUNTER — Other Ambulatory Visit: Payer: Self-pay

## 2015-05-10 MED ORDER — HYDROCODONE-ACETAMINOPHEN 10-325 MG PO TABS: 1.0000 | ORAL_TABLET | Freq: Every evening | ORAL | Status: DC | PRN

## 2015-05-10 NOTE — Telephone Encounter (Signed)
Rx given to pt, and advised to follow up with PCP for further refills

## 2015-06-14 ENCOUNTER — Telehealth: Payer: Self-pay | Admitting: *Deleted

## 2015-06-14 ENCOUNTER — Other Ambulatory Visit: Payer: Self-pay | Admitting: Internal Medicine

## 2015-06-14 NOTE — Telephone Encounter (Signed)
Left msg on triage requesting refills on his hydrocodone. MD out of office will hold until he return tomorrow...David Bolton

## 2015-06-15 MED ORDER — HYDROCODONE-ACETAMINOPHEN 10-325 MG PO TABS: 1.0000 | ORAL_TABLET | Freq: Every evening | ORAL | Status: DC | PRN

## 2015-06-15 NOTE — Telephone Encounter (Signed)
Done hardcopy to Dahlia  

## 2015-06-15 NOTE — Telephone Encounter (Signed)
Notified pt rx ready for pick-up.../lmb 

## 2015-07-13 ENCOUNTER — Telehealth: Payer: Self-pay | Admitting: Internal Medicine

## 2015-07-13 MED ORDER — HYDROCODONE-ACETAMINOPHEN 10-325 MG PO TABS: 1.0000 | ORAL_TABLET | Freq: Every evening | ORAL | Status: DC | PRN

## 2015-07-13 NOTE — Telephone Encounter (Signed)
Pt informed, Rx in cabinet for pt pick up  

## 2015-07-13 NOTE — Telephone Encounter (Signed)
Patient called requesting rx for hydrocodone. Call [phone] when ready for pick up.

## 2015-07-13 NOTE — Telephone Encounter (Signed)
Done hardcopy to Dahlia  

## 2015-07-26 ENCOUNTER — Other Ambulatory Visit: Payer: Self-pay | Admitting: Internal Medicine

## 2015-08-19 ENCOUNTER — Encounter: Payer: Self-pay | Admitting: Internal Medicine

## 2015-08-19 ENCOUNTER — Ambulatory Visit (INDEPENDENT_AMBULATORY_CARE_PROVIDER_SITE_OTHER): Payer: Medicare Other | Admitting: Internal Medicine

## 2015-08-19 ENCOUNTER — Other Ambulatory Visit (INDEPENDENT_AMBULATORY_CARE_PROVIDER_SITE_OTHER): Payer: Medicare Other

## 2015-08-19 VITALS — BP 120/74 | HR 64 | Temp 98.7°F | Wt 189.0 lb

## 2015-08-19 DIAGNOSIS — E119 Type 2 diabetes mellitus without complications: Secondary | ICD-10-CM

## 2015-08-19 DIAGNOSIS — Z23 Encounter for immunization: Secondary | ICD-10-CM | POA: Diagnosis not present

## 2015-08-19 DIAGNOSIS — I10 Essential (primary) hypertension: Secondary | ICD-10-CM

## 2015-08-19 DIAGNOSIS — E785 Hyperlipidemia, unspecified: Secondary | ICD-10-CM

## 2015-08-19 DIAGNOSIS — R109 Unspecified abdominal pain: Secondary | ICD-10-CM

## 2015-08-19 LAB — URINALYSIS, ROUTINE W REFLEX MICROSCOPIC
BILIRUBIN URINE: NEGATIVE
HGB URINE DIPSTICK: NEGATIVE
KETONES UR: NEGATIVE
LEUKOCYTES UA: NEGATIVE
Nitrite: NEGATIVE
RBC / HPF: NONE SEEN (ref 0–?)
Specific Gravity, Urine: 1.02 (ref 1.000–1.030)
TOTAL PROTEIN, URINE-UPE24: NEGATIVE
URINE GLUCOSE: NEGATIVE
UROBILINOGEN UA: 0.2 (ref 0.0–1.0)
WBC, UA: NONE SEEN (ref 0–?)
pH: 6.5 (ref 5.0–8.0)

## 2015-08-19 LAB — BASIC METABOLIC PANEL
BUN: 13 mg/dL (ref 6–23)
CALCIUM: 9.7 mg/dL (ref 8.4–10.5)
CO2: 28 mEq/L (ref 19–32)
CREATININE: 0.83 mg/dL (ref 0.40–1.50)
Chloride: 102 mEq/L (ref 96–112)
GFR: 96.83 mL/min (ref >60.00)
GLUCOSE: 145 mg/dL — AB (ref 70–99)
Potassium: 4.6 mEq/L (ref 3.5–5.1)
SODIUM: 137 meq/L (ref 135–145)

## 2015-08-19 LAB — LIPID PANEL
CHOLESTEROL: 148 mg/dL (ref 0–200)
HDL: 57 mg/dL (ref >39.00)
LDL CALC: 60 mg/dL (ref 0–99)
NonHDL: 90.75
Total CHOL/HDL Ratio: 3
Triglycerides: 154 mg/dL — ABNORMAL HIGH (ref 0.0–149.0)
VLDL: 30.8 mg/dL (ref 0.0–40.0)

## 2015-08-19 LAB — TSH: TSH: 1.44 u[IU]/mL (ref 0.35–4.50)

## 2015-08-19 LAB — CBC WITH DIFFERENTIAL/PLATELET
BASOS ABS: 0 10*3/uL (ref 0.0–0.1)
Basophils Relative: 0.4 % (ref 0.0–3.0)
Eosinophils Absolute: 0.1 10*3/uL (ref 0.0–0.7)
Eosinophils Relative: 1.3 % (ref 0.0–5.0)
HCT: 43.1 % (ref 39.0–52.0)
Hemoglobin: 14.6 g/dL (ref 13.0–17.0)
LYMPHS ABS: 1.7 10*3/uL (ref 0.7–4.0)
LYMPHS PCT: 16.4 % (ref 12.0–46.0)
MCHC: 33.7 g/dL (ref 30.0–36.0)
MCV: 90.3 fl (ref 78.0–100.0)
MONOS PCT: 9 % (ref 3.0–12.0)
Monocytes Absolute: 0.9 10*3/uL (ref 0.1–1.0)
NEUTROS PCT: 72.9 % (ref 43.0–77.0)
Neutro Abs: 7.6 10*3/uL (ref 1.4–7.7)
Platelets: 265 10*3/uL (ref 150.0–400.0)
RBC: 4.78 Mil/uL (ref 4.22–5.81)
RDW: 12.5 % (ref 11.5–15.5)
WBC: 10.4 10*3/uL (ref 4.0–10.5)

## 2015-08-19 LAB — MICROALBUMIN / CREATININE URINE RATIO
Creatinine,U: 163.1 mg/dL
Microalb Creat Ratio: 1.3 mg/g (ref 0.0–30.0)
Microalb, Ur: 2.1 mg/dL — ABNORMAL HIGH (ref 0.0–1.9)

## 2015-08-19 LAB — HEPATIC FUNCTION PANEL
ALBUMIN: 4.4 g/dL (ref 3.5–5.2)
ALK PHOS: 61 U/L (ref 39–117)
ALT: 16 U/L (ref 0–53)
AST: 18 U/L (ref 0–37)
Bilirubin, Direct: 0.2 mg/dL (ref 0.0–0.3)
TOTAL PROTEIN: 7.8 g/dL (ref 6.0–8.3)
Total Bilirubin: 0.9 mg/dL (ref 0.2–1.2)

## 2015-08-19 LAB — HEMOGLOBIN A1C: HEMOGLOBIN A1C: 6.8 % — AB (ref 4.6–6.5)

## 2015-08-19 MED ORDER — HYDROCODONE-ACETAMINOPHEN 10-325 MG PO TABS: 1.0000 | ORAL_TABLET | Freq: Every evening | ORAL | Status: DC | PRN

## 2015-08-19 MED ORDER — CIPROFLOXACIN HCL 500 MG PO TABS: 500.0000 mg | ORAL_TABLET | Freq: Two times a day (BID) | ORAL | Status: DC

## 2015-08-19 NOTE — Assessment & Plan Note (Signed)
stable overall by history and exam, recent data reviewed with pt, and pt to continue medical treatment as before,  to f/u any worsening symptoms or concerns BP Readings from Last 3 Encounters:  08/19/15 120/74  02/11/15 136/82  09/30/14 170/100

## 2015-08-19 NOTE — Addendum Note (Signed)
Addended by: Biagio Borg on: 08/19/2015 10:44 AM   Modules accepted: Orders

## 2015-08-19 NOTE — Assessment & Plan Note (Signed)
stable overall by history and exam, recent data reviewed with pt, and pt to continue medical treatment as before,  to f/u any worsening symptoms or concerns Lab Results  Component Value Date   HGBA1C 6.8* 02/18/2015   For f/u lab today

## 2015-08-19 NOTE — Patient Instructions (Signed)
You had the flu shot today  Please take all new medication as prescribed - the antibiotic  Please continue all other medications as before, and refills have been done if requested.  Please have the pharmacy call with any other refills you may need.  Please continue your efforts at being more active, low cholesterol diet, and weight control.  You are otherwise up to date with prevention measures today.  Please keep your appointments with your specialists as you may have planned  Please go to the LAB in the Basement (turn left off the elevator) for the tests to be done today  You will be contacted by phone if any changes need to be made immediately.  Otherwise, you will receive a letter about your results with an explanation, but please check with MyChart first.  Please remember to sign up for MyChart if you have not done so, as this will be important to you in the future with finding out test results, communicating by private email, and scheduling acute appointments online when needed.  Please return in 6 months, or sooner if needed 

## 2015-08-19 NOTE — Progress Notes (Signed)
Subjective:    Patient ID: David Bolton, male    DOB: 01/03/1941, 74 y.o.   MRN: [id number]  HPI  Here for early f/u;  Overall doing ok;  Pt denies Chest pain, worsening SOB, DOE, wheezing, orthopnea, PND, worsening LE edema, palpitations, dizziness or syncope.  Pt denies neurological change such as new headache, facial or extremity weakness.  Pt denies polydipsia, polyuria, or low sugar symptoms. Pt states overall good compliance with treatment and medications, good tolerability, and has been trying to follow appropriate diet.  Pt denies worsening depressive symptoms, suicidal ideation or panic. No fever, night sweats, wt loss, loss of appetite, or other constitutional symptoms.  Pt states good ability with ADL's, has low fall risk, home safety reviewed and adequate, no other significant changes in hearing or vision, and only occasionally active with exercise.  Does have some urinary slowing assoc with feverish and low mid abd.pubic area pain x 2 days. Denies urinary symptoms such as dysuria, frequency, urgency, flank pain, hematuria or n/v.  Has hx of BPH and what sounds like TURP.  No longer seeing urology as has done well.  Past Medical History  Diagnosis Date  . HYPERLIPIDEMIA 06/08/2007  . ANXIETY 06/08/2007  . DEPENDENCE, ALCOHOL NEC/NOS, IN REMISSION 06/08/2007  . DEPRESSION 06/08/2007  . HYPERTENSION 06/08/2007  . ALLERGIC RHINITIS 06/08/2007  . GERD 06/08/2007  . DIVERTICULOSIS, COLON 06/08/2007  . IBS 06/08/2007  . PILONIDAL CYST 06/08/2007  . DEGENERATION, CERVICAL DISC 07/30/2007  . Cervicalgia 12/05/2007  . LOW BACK PAIN, CHRONIC 08/12/2010  . PLANTAR FASCIITIS 06/08/2007  . FATIGUE 09/10/2008  . Headache(784.0) 06/08/2007  . Cough 08/12/2010  . Nocturia 07/16/2009  . EPIGASTRIC TENDERNESS 12/05/2007  . COLONIC POLYPS, HX OF 06/08/2007  . Impaired glucose tolerance 02/10/2011   Past Surgical History  Procedure Laterality Date  . Pilonidal cyst surgury    . Bladder and prostate surgury    .  Cervical fusion      reports that he has never smoked. He does not have any smokeless tobacco history on file. He reports that he does not drink alcohol or use illicit drugs. family history includes Heart disease in his mother; Kidney disease in his other. Allergies  Allergen Reactions  . Doxycycline Hyclate   . Sulfonamide Derivatives    Current Outpatient Prescriptions on File Prior to Visit  Medication Sig Dispense Refill  . aspirin EC 81 MG EC tablet Take 81 mg by mouth daily.      Marland Kitchen atorvastatin (LIPITOR) 20 MG tablet TAKE 1 TABLET BY MOUTH DAILY. 90 tablet 3  . DULoxetine (CYMBALTA) 30 MG capsule Take 1 capsule (30 mg total) by mouth daily. 90 capsule 3  . Glucosamine 500 MG TABS Take by mouth.      . lansoprazole (PREVACID SOLUTAB) 30 MG disintegrating tablet Take 30 mg by mouth daily.      Marland Kitchen lisinopril (PRINIVIL,ZESTRIL) 40 MG tablet TAKE 1 TABLET BY MOUTH ONCE A DAY 90 tablet 2  . metFORMIN (GLUCOPHAGE-XR) 500 MG 24 hr tablet TAKE 1 TABLET BY MOUTH DAILY WITH BREAKFAST. 90 tablet 3  . metoprolol succinate (TOPROL-XL) 50 MG 24 hr tablet TAKE 1/2 TABLET BY MOUTH ONCE A DAY 90 tablet 2  . Multiple Vitamin (MULTIVITAMIN) capsule Take 1 capsule by mouth daily.      . OMEGA 3 340 MG CPDR Take by mouth daily.      . ranitidine (ZANTAC) 150 MG tablet Take 1 tablet (150 mg total) by mouth daily  as needed for heartburn. 90 tablet 3  . lidocaine-hydrocortisone (ANAMANTEL HC) 3-0.5 % CREA Place 1 Applicatorful rectally 2 (two) times daily. (Patient not taking: Reported on 08/19/2015) 28.35 g 1   No current facility-administered medications on file prior to visit.   Review of Systems Constitutional: Negative for increased diaphoresis, other activity, appetite or siginficant weight change other than noted HENT: Negative for worsening hearing loss, ear pain, facial swelling, mouth sores and neck stiffness.   Eyes: Negative for other worsening pain, redness or visual disturbance.  Respiratory:  Negative for shortness of breath and wheezing  Cardiovascular: Negative for chest pain and palpitations.  Gastrointestinal: Negative for diarrhea, blood in stool, abdominal distention or other pain Genitourinary: Negative for hematuria, flank pain or change in urine volume.  Musculoskeletal: Negative for myalgias or other joint complaints.  Skin: Negative for color change and wound or drainage.  Neurological: Negative for syncope and numbness. other than noted Hematological: Negative for adenopathy. or other swelling Psychiatric/Behavioral: Negative for hallucinations, SI, self-injury, decreased concentration or other worsening agitation.      Objective:   Physical Exam BP 120/74 mmHg  Pulse 64  Temp(Src) 98.7 F (37.1 C)  Wt 189 lb (85.73 kg)  SpO2 98% VS noted,  Constitutional: Pt is oriented to person, place, and time. Appears well-developed and well-nourished, in no significant distress Head: Normocephalic and atraumatic.  Right Ear: External ear normal.  Left Ear: External ear normal.  Nose: Nose normal.  Mouth/Throat: Oropharynx is clear and moist.  Eyes: Conjunctivae and EOM are normal. Pupils are equal, round, and reactive to light.  Neck: Normal range of motion. Neck supple. No JVD present. No tracheal deviation present or significant neck LA or mass Cardiovascular: Normal rate, regular rhythm, normal heart sounds and intact distal pulses.   Pulmonary/Chest: Effort normal and breath sounds without rales or wheezing  Abdominal: Soft. Bowel sounds are normal. NT. No HSM  Musculoskeletal: Normal range of motion. Exhibits no edema.  Lymphadenopathy:  Has no cervical adenopathy.  Neurological: Pt is alert and oriented to person, place, and time. Pt has normal reflexes. No cranial nerve deficit. Motor grossly intact Skin: Skin is warm and dry. No rash noted.  Psychiatric:  Has normal mood and affect. Behavior is normal.      Assessment & Plan:

## 2015-08-19 NOTE — Progress Notes (Signed)
Pre visit review using our clinic review tool, if applicable. No additional management support is needed unless otherwise documented below in the visit note. 

## 2015-08-19 NOTE — Assessment & Plan Note (Addendum)
As per HPI most c/w prostatitis, declines DRE, allergy to doxy will tx with cipro course x 3 wks, for urine studies today, consider urology referral; will hold on psa f/u today but will need with future visit

## 2015-08-19 NOTE — Assessment & Plan Note (Signed)
stable overall by history and exam, recent data reviewed with pt, and pt to continue medical treatment as before,  to f/u any worsening symptoms or concerns Lab Results  Component Value Date   LDLCALC 61 02/18/2015

## 2015-09-15 ENCOUNTER — Encounter: Payer: Self-pay | Admitting: Cardiovascular Disease

## 2015-11-09 ENCOUNTER — Other Ambulatory Visit: Payer: Self-pay | Admitting: Internal Medicine

## 2015-11-12 ENCOUNTER — Telehealth: Payer: Self-pay | Admitting: Internal Medicine

## 2015-11-12 MED ORDER — HYDROCODONE-ACETAMINOPHEN 10-325 MG PO TABS: 1.0000 | ORAL_TABLET | Freq: Every evening | ORAL | Status: DC | PRN

## 2015-11-12 NOTE — Telephone Encounter (Signed)
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

## 2015-11-12 NOTE — Telephone Encounter (Signed)
Patient requesting refill for HYDROcodone-acetaminophen (NORCO) 10-325 MG tablet [id number]

## 2015-11-12 NOTE — Telephone Encounter (Signed)
Pt informed via VM, Rx in cabinet for pt pick up  

## 2015-12-16 ENCOUNTER — Telehealth: Payer: Self-pay | Admitting: Internal Medicine

## 2015-12-16 MED ORDER — HYDROCODONE-ACETAMINOPHEN 10-325 MG PO TABS: 1.0000 | ORAL_TABLET | Freq: Every evening | ORAL | Status: DC | PRN

## 2015-12-16 NOTE — Telephone Encounter (Signed)
Pt requesting refill for HYDROcodone-acetaminophen (NORCO) 10-325 MG tablet [id number]

## 2015-12-16 NOTE — Telephone Encounter (Signed)
Please advise, patient is requesting hydrocodone refill

## 2015-12-16 NOTE — Telephone Encounter (Signed)
Done hardcopy to Corinne  

## 2015-12-17 NOTE — Telephone Encounter (Signed)
Called pt no answer LMOM rx ready for pick-up.../lmb 

## 2015-12-30 ENCOUNTER — Other Ambulatory Visit: Payer: Self-pay | Admitting: Internal Medicine

## 2016-01-10 ENCOUNTER — Ambulatory Visit (INDEPENDENT_AMBULATORY_CARE_PROVIDER_SITE_OTHER): Payer: Medicare Other | Admitting: Internal Medicine

## 2016-01-10 ENCOUNTER — Other Ambulatory Visit: Payer: Self-pay | Admitting: Internal Medicine

## 2016-01-10 ENCOUNTER — Other Ambulatory Visit (INDEPENDENT_AMBULATORY_CARE_PROVIDER_SITE_OTHER): Payer: Medicare Other

## 2016-01-10 ENCOUNTER — Telehealth: Payer: Self-pay | Admitting: Internal Medicine

## 2016-01-10 ENCOUNTER — Encounter: Payer: Self-pay | Admitting: Internal Medicine

## 2016-01-10 VITALS — BP 130/84 | HR 60 | Temp 97.8°F | Resp 20 | Wt 192.0 lb

## 2016-01-10 DIAGNOSIS — E119 Type 2 diabetes mellitus without complications: Secondary | ICD-10-CM

## 2016-01-10 DIAGNOSIS — I10 Essential (primary) hypertension: Secondary | ICD-10-CM

## 2016-01-10 DIAGNOSIS — M545 Low back pain: Secondary | ICD-10-CM

## 2016-01-10 DIAGNOSIS — E785 Hyperlipidemia, unspecified: Secondary | ICD-10-CM | POA: Diagnosis not present

## 2016-01-10 DIAGNOSIS — M25511 Pain in right shoulder: Secondary | ICD-10-CM | POA: Diagnosis not present

## 2016-01-10 DIAGNOSIS — G8929 Other chronic pain: Secondary | ICD-10-CM

## 2016-01-10 LAB — HEPATIC FUNCTION PANEL
ALK PHOS: 55 U/L (ref 39–117)
ALT: 16 U/L (ref 0–53)
AST: 21 U/L (ref 0–37)
Albumin: 4.4 g/dL (ref 3.5–5.2)
BILIRUBIN DIRECT: 0.1 mg/dL (ref 0.0–0.3)
BILIRUBIN TOTAL: 0.6 mg/dL (ref 0.2–1.2)
Total Protein: 7.6 g/dL (ref 6.0–8.3)

## 2016-01-10 LAB — BASIC METABOLIC PANEL
BUN: 12 mg/dL (ref 6–23)
CHLORIDE: 100 meq/L (ref 96–112)
CO2: 26 mEq/L (ref 19–32)
Calcium: 9.5 mg/dL (ref 8.4–10.5)
Creatinine, Ser: 0.78 mg/dL (ref 0.40–1.50)
GFR: 103.91 mL/min (ref >60.00)
Glucose, Bld: 173 mg/dL — ABNORMAL HIGH (ref 70–99)
POTASSIUM: 4.1 meq/L (ref 3.5–5.1)
SODIUM: 136 meq/L (ref 135–145)

## 2016-01-10 LAB — HEMOGLOBIN A1C: Hgb A1c MFr Bld: 7.6 % — ABNORMAL HIGH (ref 4.6–6.5)

## 2016-01-10 LAB — LIPID PANEL
CHOL/HDL RATIO: 3
Cholesterol: 136 mg/dL (ref 0–200)
HDL: 50.2 mg/dL (ref >39.00)
LDL CALC: 60 mg/dL (ref 0–99)
NONHDL: 85.37
TRIGLYCERIDES: 126 mg/dL (ref 0.0–149.0)
VLDL: 25.2 mg/dL (ref 0.0–40.0)

## 2016-01-10 MED ORDER — METFORMIN HCL ER 500 MG PO TB24: 1000.0000 mg | ORAL_TABLET | Freq: Every day | ORAL | Status: DC

## 2016-01-10 MED ORDER — HYDROCODONE-ACETAMINOPHEN 10-325 MG PO TABS: 1.0000 | ORAL_TABLET | Freq: Every evening | ORAL | Status: DC | PRN

## 2016-01-10 NOTE — Assessment & Plan Note (Signed)
Exam c/w bursitis and possible bicipital tendonitis - for pain control, refer sports med, likely would benefit from cortisone injection

## 2016-01-10 NOTE — Telephone Encounter (Signed)
Tried to call pt back. Call was forwarded to VM

## 2016-01-10 NOTE — Assessment & Plan Note (Signed)
stable overall by history and exam, recent data reviewed with pt, and pt to continue medical treatment as before,  to f/u any worsening symptoms or concerns Lab Results  Component Value Date   LDLCALC 60 08/19/2015

## 2016-01-10 NOTE — Patient Instructions (Signed)
Please continue all other medications as before, and refills have been done if requested.  Please have the pharmacy call with any other refills you may need.  Please continue your efforts at being more active, low cholesterol diet, and weight control.  You are otherwise up to date with prevention measures today.  Please keep your appointments with your specialists as you may have planned  You will be contacted regarding the referral for: Dr Tamala Julian (sports medicine) in this office  (or you can make an appt as you leave at the scheduling desk)  Please go to the LAB in the Basement (turn left off the elevator) for the tests to be done today  You will be contacted by phone if any changes need to be made immediately.  Otherwise, you will receive a letter about your results with an explanation, but please check with MyChart first.  Please remember to sign up for MyChart if you have not done so, as this will be important to you in the future with finding out test results, communicating by private email, and scheduling acute appointments online when needed.  Please return in 6 months, or sooner if needed, with Lab testing done 3-5 days before  OK to cancel the April 2017 appt as you were here today

## 2016-01-10 NOTE — Progress Notes (Signed)
Pre visit review using our clinic review tool, if applicable. No additional management support is needed unless otherwise documented below in the visit note. 

## 2016-01-10 NOTE — Assessment & Plan Note (Signed)
stable overall by history and exam, recent data reviewed with pt, and pt to continue medical treatment as before,  to f/u any worsening symptoms or concerns BP Readings from Last 3 Encounters:  01/10/16 130/84  08/19/15 120/74  02/11/15 136/82

## 2016-01-10 NOTE — Assessment & Plan Note (Signed)
Ok for med refills, [employer]

## 2016-01-10 NOTE — Assessment & Plan Note (Signed)
stable overall by history and exam, recent data reviewed with pt, and pt to continue medical treatment as before,  to f/u any worsening symptoms or concerns Lab Results  Component Value Date   HGBA1C 6.8* 08/19/2015

## 2016-01-10 NOTE — Telephone Encounter (Signed)
Please give pt a call. He called you back regarding labs

## 2016-01-10 NOTE — Progress Notes (Signed)
Subjective:    Patient ID: David Bolton, male    DOB: [birth year], 75 y.o.   MRN: [id number]  HPI  Here with right shoulder pain, seemed to start initially with raking leaves in oct 2016, started mild intermittent, sharp twinges without neck pain more than usual (has chronic neck pain) without right radicular pain; does have intermittent left neck pain and radicular pain/? Mild weakness no change;  Has some left shoulder pain as well, but not nearly like the now mod to severe pain to right shoulder, worse to move it most directions or lifting even 5 lbs, gets worse in the AM, seems to be better later in the day.  Has hydrocodone prn chronic pain but this is new pain.  No weakness of the RUE.  No recent trauma. Worse to roll over on the right side at night, sleeps in a recliner with pillows to avoid pain to the right shoulder on rolling over in bed  Is s/p right rot cuff surgury x 20 yrs per Dr Daylene Katayama, now retired.  Does not use the hydrocodone very often as makes him sleepy, only takes 30 /mo max, needs refill.  Does also have some mild right knee pain anteriorly below the patella for a few wks, sharp, worse to walk, better to sit. Marland KitchenAlso Here to f/u; overall doing ok,  Pt denies chest pain, increasing sob or doe, wheezing, orthopnea, PND, increased LE swelling, palpitations, dizziness or syncope.  Pt denies new neurological symptoms such as new headache, or facial or extremity weakness or numbness.  Pt denies polydipsia, polyuria, or low sugar episode.   Pt denies new neurological symptoms such as new headache, or facial or extremity weakness or numbness.   Pt states overall good compliance with meds, mostly trying to follow appropriate diet, with wt overall stable,  but little exercise however. Wt Readings from Last 3 Encounters:  01/10/16 192 lb (87.091 kg)  08/19/15 189 lb (85.73 kg)  02/11/15 188 lb (85.276 kg)   Past Medical History  Diagnosis Date  . HYPERLIPIDEMIA 06/08/2007  . ANXIETY  06/08/2007  . DEPENDENCE, ALCOHOL NEC/NOS, IN REMISSION 06/08/2007  . DEPRESSION 06/08/2007  . HYPERTENSION 06/08/2007  . ALLERGIC RHINITIS 06/08/2007  . GERD 06/08/2007  . DIVERTICULOSIS, COLON 06/08/2007  . IBS 06/08/2007  . PILONIDAL CYST 06/08/2007  . DEGENERATION, CERVICAL DISC 07/30/2007  . Cervicalgia 12/05/2007  . LOW BACK PAIN, CHRONIC 08/12/2010  . PLANTAR FASCIITIS 06/08/2007  . FATIGUE 09/10/2008  . Headache(784.0) 06/08/2007  . Cough 08/12/2010  . Nocturia 07/16/2009  . EPIGASTRIC TENDERNESS 12/05/2007  . COLONIC POLYPS, HX OF 06/08/2007  . Impaired glucose tolerance 02/10/2011   Past Surgical History  Procedure Laterality Date  . Pilonidal cyst surgury    . Bladder and prostate surgury    . Cervical fusion      reports that he has never smoked. He does not have any smokeless tobacco history on file. He reports that he does not drink alcohol or use illicit drugs. family history includes Heart disease in his mother; Kidney disease in his other. Allergies  Allergen Reactions  . Doxycycline Hyclate   . Sulfonamide Derivatives    Current Outpatient Prescriptions on File Prior to Visit  Medication Sig Dispense Refill  . amLODipine (NORVASC) 5 MG tablet Take 1 tablet (5 mg total) by mouth daily. 90 tablet 3  . amLODipine (NORVASC) 5 MG tablet TAKE 1 TABLET BY MOUTH ONCE A DAY 90 tablet 3  . aspirin EC 81 MG  EC tablet Take 81 mg by mouth daily.      Marland Kitchen atorvastatin (LIPITOR) 20 MG tablet TAKE 1 TABLET BY MOUTH DAILY. 90 tablet 3  . ciprofloxacin (CIPRO) 500 MG tablet Take 1 tablet (500 mg total) by mouth 2 (two) times daily. 42 tablet 0  . DULoxetine (CYMBALTA) 30 MG capsule Take 1 capsule (30 mg total) by mouth daily. 90 capsule 3  . Glucosamine 500 MG TABS Take by mouth.      Marland Kitchen HYDROcodone-acetaminophen (NORCO) 10-325 MG tablet Take 1 tablet by mouth at bedtime as needed. To fill Dec 17, 2015 30 tablet 0  . lansoprazole (PREVACID SOLUTAB) 30 MG disintegrating tablet Take 30 mg by mouth daily.       Marland Kitchen lidocaine-hydrocortisone (ANAMANTEL HC) 3-0.5 % CREA Place 1 Applicatorful rectally 2 (two) times daily. 28.35 g 1  . lisinopril (PRINIVIL,ZESTRIL) 40 MG tablet TAKE 1 TABLET BY MOUTH ONCE A DAY 90 tablet 2  . metFORMIN (GLUCOPHAGE-XR) 500 MG 24 hr tablet TAKE 1 TABLET BY MOUTH DAILY WITH BREAKFAST. 90 tablet 3  . metoprolol succinate (TOPROL-XL) 50 MG 24 hr tablet TAKE 1/2 TABLET BY MOUTH ONCE A DAY 90 tablet 2  . Multiple Vitamin (MULTIVITAMIN) capsule Take 1 capsule by mouth daily.      . OMEGA 3 340 MG CPDR Take by mouth daily.      . ranitidine (ZANTAC) 150 MG tablet Take 1 tablet (150 mg total) by mouth daily as needed for heartburn. 90 tablet 3   No current facility-administered medications on file prior to visit.   Review of Systems  Constitutional: Negative for unusual diaphoresis or night sweats HENT: Negative for ringing in ear or discharge Eyes: Negative for double vision or worsening visual disturbance.  Respiratory: Negative for choking and stridor.   Gastrointestinal: Negative for vomiting or other signifcant bowel change Genitourinary: Negative for hematuria or change in urine volume.  Musculoskeletal: Negative for other MSK pain or swelling Skin: Negative for color change and worsening wound.  Neurological: Negative for tremors and numbness other than noted  Psychiatric/Behavioral: Negative for decreased concentration or agitation other than above       Objective:   Physical Exam BP 130/84 mmHg  Pulse 60  Temp(Src) 97.8 F (36.6 C) (Oral)  Resp 20  Wt 192 lb (87.091 kg)  SpO2 98% VS noted, not ill appering Constitutional: Pt appears in no significant distress HENT: Head: NCAT.  Right Ear: External ear normal.  Left Ear: External ear normal.  Eyes: . Pupils are equal, round, and reactive to light. Conjunctivae and EOM are normal Neck: Normal range of motion. Neck supple.  Cardiovascular: Normal rate and regular rhythm.   Pulmonary/Chest: Effort normal and  breath sounds without rales or wheezing.  c-spine with mild tender to lowest midline and right paravertebral Right shoulder with visible and palpable but nondiscrete mild to mod swelling to the subacromial with tender, worse pain to forward elevation and abduction, also tender over right bicep insertion site Neurological: Pt is alert. Not confused , motor grossly intact Skin: Skin is warm. No rash, no LE edema Psychiatric: Pt behavior is normal. No agitation.     Assessment & Plan:

## 2016-01-11 NOTE — Telephone Encounter (Signed)
Pt informed of results and Metformin increase.

## 2016-01-25 ENCOUNTER — Ambulatory Visit (INDEPENDENT_AMBULATORY_CARE_PROVIDER_SITE_OTHER): Payer: Medicare Other | Admitting: Family Medicine

## 2016-01-25 ENCOUNTER — Encounter: Payer: Self-pay | Admitting: Family Medicine

## 2016-01-25 ENCOUNTER — Ambulatory Visit (INDEPENDENT_AMBULATORY_CARE_PROVIDER_SITE_OTHER)
Admission: RE | Admit: 2016-01-25 | Discharge: 2016-01-25 | Disposition: A | Discharge status: Home / Self Care | Payer: Medicare Other | Source: Ambulatory Visit | Attending: Family Medicine | Admitting: Family Medicine

## 2016-01-25 ENCOUNTER — Other Ambulatory Visit (INDEPENDENT_AMBULATORY_CARE_PROVIDER_SITE_OTHER): Payer: Medicare Other

## 2016-01-25 VITALS — BP 140/84 | HR 64 | Ht 70.0 in | Wt 193.0 lb

## 2016-01-25 DIAGNOSIS — M75101 Unspecified rotator cuff tear or rupture of right shoulder, not specified as traumatic: Secondary | ICD-10-CM

## 2016-01-25 DIAGNOSIS — M25419 Effusion, unspecified shoulder: Secondary | ICD-10-CM | POA: Insufficient documentation

## 2016-01-25 DIAGNOSIS — M25511 Pain in right shoulder: Secondary | ICD-10-CM

## 2016-01-25 DIAGNOSIS — M12811 Other specific arthropathies, not elsewhere classified, right shoulder: Secondary | ICD-10-CM | POA: Diagnosis not present

## 2016-01-25 DIAGNOSIS — M25411 Effusion, right shoulder: Secondary | ICD-10-CM | POA: Diagnosis not present

## 2016-01-25 NOTE — Patient Instructions (Signed)
Severe arthritis of the shoulder Injected today which should help Ice 20 minutes 2 times daily. Usually after activity and before bed. Exercises 3 times a week just range of motion  pennsaid pinkie amount topically 2 times daily as needed.  Vitamin D 2000 IU daily  Pain medicine when you need it I can repeat injection in 3 months if needed See me again in 4 weeks if not great

## 2016-01-25 NOTE — Assessment & Plan Note (Signed)
Patient did have rotator cuff arthropathy without effusion. Patient did have aspiration done today an injection. Tolerated the procedure well. Discussed with patient that this likely will recur. We discussed that likely secondary to the amount of arthritis that she would not be a candidate for any type of surgical repair especially with the atrophy seen of the rotator cuff. Patient had x-rays today for further evaluation of the bony antibodies a could be contribute in. We discussed icing. Patient work with Product/process development scientist. Given trial of topical anti-inflammatories. Patient will come back and see me again in 4 weeks for further evaluation and treatment.

## 2016-01-25 NOTE — Progress Notes (Signed)
Pre visit review using our clinic review tool, if applicable. No additional management support is needed unless otherwise documented below in the visit note. 

## 2016-01-25 NOTE — Progress Notes (Signed)
[name] [address], [place] [address] Phone: [phone] Subjective:    I'm seeing this patient by the request  of:  Cathlean Cower, MD   CC: Right shoulder pain  [id number] David Bolton is a 75 y.o. male coming in with complaint of right shoulder pain. Has been going on for proximal he 5 months. Seems to be worsening. Wake him up at night. States that he is losing range of motion. Losing strength. Does not remember any true injury but states that raking leaves seem to exacerbate the problem. Patient has been using his left arm more and this is starting to give him some discomfort. Has a past medical history significant for a rotator cuff repair on this side. Some mild radiation down the arm. Does have a history of cervical neck surgery but states that this feels different. No numbness or tingling in the externally. Rates the severity pain is 8 out of 10 and not responding to even his pain medications.    Past Medical History  Diagnosis Date  . HYPERLIPIDEMIA 06/08/2007  . ANXIETY 06/08/2007  . DEPENDENCE, ALCOHOL NEC/NOS, IN REMISSION 06/08/2007  . DEPRESSION 06/08/2007  . HYPERTENSION 06/08/2007  . ALLERGIC RHINITIS 06/08/2007  . GERD 06/08/2007  . DIVERTICULOSIS, COLON 06/08/2007  . IBS 06/08/2007  . PILONIDAL CYST 06/08/2007  . DEGENERATION, CERVICAL DISC 07/30/2007  . Cervicalgia 12/05/2007  . LOW BACK PAIN, CHRONIC 08/12/2010  . PLANTAR FASCIITIS 06/08/2007  . FATIGUE 09/10/2008  . Headache(784.0) 06/08/2007  . Cough 08/12/2010  . Nocturia 07/16/2009  . EPIGASTRIC TENDERNESS 12/05/2007  . COLONIC POLYPS, HX OF 06/08/2007  . Impaired glucose tolerance 02/10/2011   Past Surgical History  Procedure Laterality Date  . Pilonidal cyst surgury    . Bladder and prostate surgury    . Cervical fusion     Social History   Social History  . Marital Status: Divorced    Spouse Name: N/A  . Number of Children: 3  . Years of Education: N/A   Occupational  History  . retired Engineering geologist delivery    Social History Main Topics  . Smoking status: Never Smoker   . Smokeless tobacco: None  . Alcohol Use: No  . Drug Use: No  . Sexual Activity: Not Asked   Other Topics Concern  . None   Social History Narrative   Allergies  Allergen Reactions  . Doxycycline Hyclate   . Sulfonamide Derivatives    Family History  Problem Relation Age of Onset  . Heart disease Mother     mature heart disease  . Kidney disease Other     Past medical history, social, surgical and family history all reviewed in electronic medical record.  No pertanent information unless stated regarding to the chief complaint.   Review of Systems: No headache, visual changes, nausea, vomiting, diarrhea, constipation, dizziness, abdominal pain, skin rash, fevers, chills, night sweats, weight loss, swollen lymph nodes, body aches, joint swelling, muscle aches, chest pain, shortness of breath, mood changes.   Objective Blood pressure 140/84, pulse 64, height 5\' 10"  (1.778 m), weight 193 lb (87.544 kg), SpO2 97 %.  General: No apparent distress alert and oriented x3 mood and affect normal, dressed appropriately.  HEENT: Pupils equal, extraocular movements intact  Respiratory: Patient's speak in full sentences and does not appear short of breath  Cardiovascular: No lower extremity edema, non tender, no erythema  Skin: Warm dry intact with no signs of infection or  rash on extremities or on axial skeleton.  Abdomen: Soft nontender  Neuro: Cranial nerves II through XII are intact, neurovascularly intact in all extremities with 2+ DTRs and 2+ pulses.  Lymph: No lymphadenopathy of posterior or anterior cervical chain or axillae bilaterally.  Gait normal with good balance and coordination.  MSK:  Non tender with full range of motion and good stability and symmetric strength and tone of , elbows, wrist, hip, knee and ankles bilaterally.  Shoulder: Right Mild  atrophy of the musculature of the shoulder Tender to palpation diffusely Next last 15 afford flexion as well as internal rotation to sacrum and external rotation of 5 Rotator cuff strength 3/5 strength signs of impingement with positive Neer and Hawkin's tests, but negative empty can sign. Speeds and Yergason's tests are minorly positive. Positive labral pathology Normal scapular function observed. Positive drop arm sign No apprehension sign Contralateral shoulder mild impingement signs but otherwise unremarkable.  MSK US performed of: Right This study was ordered, performed, and interpreted by Charlann Boxer D.O.  Shoulder:   Supraspinatus:  Large degenerative tear with atrophy Infraspinatus:  Significant atrophy noted Subscapularis:  Significant hypoechoic changes noted. Seems to have tear on the anterior labrum Teres Minor:  AC joint:  Moderate arthritis Glenohumeral Joint: Moderate arthritis with effusion Glenoid Labrum:  Degenerative tears noted Biceps Tendon:  Technician hypoechoic changes around the biceps tendon within the tendon sheath  Impression: Severe rotator cuff arthropathy with effusion  Procedure: Real-time Ultrasound Guided Injection of right glenohumeral joint Device: GE Logiq E  Ultrasound guided injection is preferred based studies that show increased duration, increased effect, greater accuracy, decreased procedural pain, increased response rate with ultrasound guided versus blind injection.  Verbal informed consent obtained.  Time-out conducted.  Noted no overlying erythema, induration, or other signs of local infection.  Skin prepped in a sterile fashion.  Local anesthesia: Topical Ethyl chloride.  With sterile technique and under real time ultrasound guidance:  Joint visualized.  23g 1  inch needle inserted posterior approach. Pictures taken for needle placement. Patient did have injection of, 2 cc of 0.5% Marcaine, and aspirated 10 mL of strawlike colored  fluid then injected and 1.0 cc of Kenalog 40 mg/dL. Completed without difficulty  Pain immediately resolved suggesting accurate placement of the medication.  Advised to call if fevers/chills, erythema, induration, drainage, or persistent bleeding.  Images permanently stored and available for review in the ultrasound unit.  Impression: Technically successful ultrasound guided injection.  Procedure note [id number]; 15 minutes spent for Therapeutic exercises as stated in above notes.  This included exercises focusing on stretching, strengthening, with significant focus on eccentric aspects. Basic scapular stabilization to include adduction and depression of scapula Scaption, focusing on proper movement and good control Internal and External rotation utilizing a theraband, with elbow tucked at side entire time Rows with theraband  Proper technique shown and discussed handout in great detail with ATC.  All questions were discussed and answered.     Impression and Recommendations:     This case required medical decision making of moderate complexity.      Note: This dictation was prepared with Dragon dictation along with smaller phrase technology. Any transcriptional errors that result from this process are unintentional.

## 2016-02-17 ENCOUNTER — Ambulatory Visit: Payer: Medicare Other | Admitting: Internal Medicine

## 2016-02-22 ENCOUNTER — Ambulatory Visit (INDEPENDENT_AMBULATORY_CARE_PROVIDER_SITE_OTHER): Payer: Medicare Other | Admitting: Family Medicine

## 2016-02-22 ENCOUNTER — Encounter: Payer: Self-pay | Admitting: Family Medicine

## 2016-02-22 VITALS — BP 118/80 | HR 61 | Ht 70.0 in | Wt 192.0 lb

## 2016-02-22 DIAGNOSIS — M75101 Unspecified rotator cuff tear or rupture of right shoulder, not specified as traumatic: Principal | ICD-10-CM

## 2016-02-22 DIAGNOSIS — M12811 Other specific arthropathies, not elsewhere classified, right shoulder: Secondary | ICD-10-CM | POA: Diagnosis not present

## 2016-02-22 NOTE — Progress Notes (Signed)
[name] [address], [place] [address] Phone: [phone] Subjective:    I'm seeing this patient by the request  of:  Cathlean Cower, MD   CC: Right shoulder pain follow-up  [id number] David Bolton is a 75 y.o. male coming in with complaint of right shoulder pain. Patient was seen previously with significant arthritis a shoulderskin was given an injection. Patient tolerated procedure well and states that he did have about a but due to 60% improvement for approximate 2-3 weeks. Over the course last week so he feels like he is back to where he was prior to this. Continues to have the weakness more than anything else that is concerning. Patient states some pain with certain range of motion. Denies any radiation down the arm or any numbness. Denies any neck pain that seems to be associated with it. Overall he does think that it is easier for him to do regular daily activities just can't do the sports that he used to do.    Past Medical History  Diagnosis Date  . HYPERLIPIDEMIA 06/08/2007  . ANXIETY 06/08/2007  . DEPENDENCE, ALCOHOL NEC/NOS, IN REMISSION 06/08/2007  . DEPRESSION 06/08/2007  . HYPERTENSION 06/08/2007  . ALLERGIC RHINITIS 06/08/2007  . GERD 06/08/2007  . DIVERTICULOSIS, COLON 06/08/2007  . IBS 06/08/2007  . PILONIDAL CYST 06/08/2007  . DEGENERATION, CERVICAL DISC 07/30/2007  . Cervicalgia 12/05/2007  . LOW BACK PAIN, CHRONIC 08/12/2010  . PLANTAR FASCIITIS 06/08/2007  . FATIGUE 09/10/2008  . Headache(784.0) 06/08/2007  . Cough 08/12/2010  . Nocturia 07/16/2009  . EPIGASTRIC TENDERNESS 12/05/2007  . COLONIC POLYPS, HX OF 06/08/2007  . Impaired glucose tolerance 02/10/2011   Past Surgical History  Procedure Laterality Date  . Pilonidal cyst surgury    . Bladder and prostate surgury    . Cervical fusion     Social History   Social History  . Marital Status: Divorced    Spouse Name: N/A  . Number of Children: 3  . Years of Education: N/A    Occupational History  . retired Engineering geologist delivery    Social History Main Topics  . Smoking status: Never Smoker   . Smokeless tobacco: Not on file  . Alcohol Use: No  . Drug Use: No  . Sexual Activity: Not on file   Other Topics Concern  . Not on file   Social History Narrative   Allergies  Allergen Reactions  . Doxycycline Hyclate   . Sulfonamide Derivatives    Family History  Problem Relation Age of Onset  . Heart disease Mother     mature heart disease  . Kidney disease Other     Past medical history, social, surgical and family history all reviewed in electronic medical record.  No pertanent information unless stated regarding to the chief complaint.   Review of Systems: No headache, visual changes, nausea, vomiting, diarrhea, constipation, dizziness, abdominal pain, skin rash, fevers, chills, night sweats, weight loss, swollen lymph nodes, body aches, joint swelling, muscle aches, chest pain, shortness of breath, mood changes.   Objective There were no vitals taken for this visit.  General: No apparent distress alert and oriented x3 mood and affect normal, dressed appropriately.  HEENT: Pupils equal, extraocular movements intact  Respiratory: Patient's speak in full sentences and does not appear short of breath  Cardiovascular: No lower extremity edema, non tender, no erythema  Skin: Warm dry intact with no signs of infection or rash on extremities or  on axial skeleton.  Abdomen: Soft nontender  Neuro: Cranial nerves II through XII are intact, neurovascularly intact in all extremities with 2+ DTRs and 2+ pulses.  Lymph: No lymphadenopathy of posterior or anterior cervical chain or axillae bilaterally.  Gait normal with good balance and coordination.  MSK:  Non tender with full range of motion and good stability and symmetric strength and tone of , elbows, wrist, hip, knee and ankles bilaterally.  Shoulder: Right Mild atrophy of the musculature  of the shoulder Tender to palpation diffusely Still limited range of motion noted in all planes moderate nature. Rotator cuff strength 3/5 strength signs of impingement with positive Neer and Hawkin's tests, but negative empty can sign. Speeds and Yergason's tests are minorly positive. Positive labral pathology Normal scapular function observed. Positive drop arm sign stump present No apprehension sign Contralateral shoulder mild impingement signs but otherwise unremarkable. No significant change from previous exam.    Impression and Recommendations:     This case required medical decision making of moderate complexity.      Note: This dictation was prepared with Dragon dictation along with smaller phrase technology. Any transcriptional errors that result from this process are unintentional.

## 2016-02-22 NOTE — Progress Notes (Signed)
Pre visit review using our clinic review tool, if applicable. No additional management support is needed unless otherwise documented below in the visit note. 

## 2016-02-22 NOTE — Patient Instructions (Signed)
Good to see you   Heat in the morning and ice at night Keep trying the range of motion exercises Avoid heavy lifting or throwing.  We can repeat injection in 6 weeks

## 2016-02-22 NOTE — Assessment & Plan Note (Signed)
Discussed with patient that we can repeat injection every 3 months. I do think that this is going to be more secondary to conservative measures. Otherwise patient would be a candidate for surgical intervention. We discussed certain injections including viscous supplementation as well as PRP has been possible treatment options but not very optimistic he'll make a significant improvement. Discuss we could do advance imaging but I do not think it would change medical management. At this time patient will follow-up again in 6 weeks. If continuing have pain we will do a repeat steroid injection.

## 2016-02-23 ENCOUNTER — Ambulatory Visit: Payer: Medicare Other | Admitting: Internal Medicine

## 2016-04-04 ENCOUNTER — Ambulatory Visit (INDEPENDENT_AMBULATORY_CARE_PROVIDER_SITE_OTHER): Payer: Medicare Other | Admitting: Family Medicine

## 2016-04-04 ENCOUNTER — Encounter: Payer: Self-pay | Admitting: Family Medicine

## 2016-04-04 VITALS — BP 136/78 | HR 68 | Ht 70.0 in | Wt 190.0 lb

## 2016-04-04 DIAGNOSIS — M5030 Other cervical disc degeneration, unspecified cervical region: Secondary | ICD-10-CM

## 2016-04-04 DIAGNOSIS — M75101 Unspecified rotator cuff tear or rupture of right shoulder, not specified as traumatic: Principal | ICD-10-CM

## 2016-04-04 DIAGNOSIS — M12811 Other specific arthropathies, not elsewhere classified, right shoulder: Secondary | ICD-10-CM

## 2016-04-04 MED ORDER — GABAPENTIN 100 MG PO CAPS: 100.0000 mg | ORAL_CAPSULE | Freq: Every day | ORAL | Status: DC

## 2016-04-04 NOTE — Progress Notes (Signed)
Pre visit review using our clinic review tool, if applicable. No additional management support is needed unless otherwise documented below in the visit note. 

## 2016-04-04 NOTE — Assessment & Plan Note (Signed)
Severe arthritic changes status post surgery. We'll continue to monitor. Started on gabapentin. Discuss if any radicular symptoms seem to be worsening or worsening weakness we can consider further workup and likely would be surgical intervention which patient would not want to do. Follow-up in 6 weeks.

## 2016-04-04 NOTE — Progress Notes (Signed)
[facility] [address] [place], [place] [address] Phone: [phone] Subjective:    I'm seeing this patient by the request  of:  David Cower, MD   CC: Right shoulder pain follow-up  [id number] David Bolton is a 75 y.o. male coming in with complaint of right shoulder pain. Found to have severe rotator cuff arthropathy. Patient was given an injection greater than 12 weeks ago. Patient states that he continues to have some pain. States though that he is wondering if he can do other activities. Notices that it seems to go up towards his right side of his neck. Does have a history of arthritis of the neck as well as has had a fusion previously. Patient states that he has noticed some mild decrease in range of motion as well. Patient states that does affect daily activities such as dressing even. Patient states even wake him up at night as well.    Past Medical History  Diagnosis Date  . HYPERLIPIDEMIA 06/08/2007  . ANXIETY 06/08/2007  . DEPENDENCE, ALCOHOL NEC/NOS, IN REMISSION 06/08/2007  . DEPRESSION 06/08/2007  . HYPERTENSION 06/08/2007  . ALLERGIC RHINITIS 06/08/2007  . GERD 06/08/2007  . DIVERTICULOSIS, COLON 06/08/2007  . IBS 06/08/2007  . PILONIDAL CYST 06/08/2007  . DEGENERATION, CERVICAL DISC 07/30/2007  . Cervicalgia 12/05/2007  . LOW BACK PAIN, CHRONIC 08/12/2010  . PLANTAR FASCIITIS 06/08/2007  . FATIGUE 09/10/2008  . Headache(784.0) 06/08/2007  . Cough 08/12/2010  . Nocturia 07/16/2009  . EPIGASTRIC TENDERNESS 12/05/2007  . COLONIC POLYPS, HX OF 06/08/2007  . Impaired glucose tolerance 02/10/2011   Past Surgical History  Procedure Laterality Date  . Pilonidal cyst surgury    . Bladder and prostate surgury    . Cervical fusion     Social History   Social History  . Marital Status: Divorced    Spouse Name: N/A  . Number of Children: 3  . Years of Education: N/A   Occupational History  . retired Engineering geologist delivery    Social  History Main Topics  . Smoking status: Never Smoker   . Smokeless tobacco: None  . Alcohol Use: No  . Drug Use: No  . Sexual Activity: Not Asked   Other Topics Concern  . None   Social History Narrative   Allergies  Allergen Reactions  . Doxycycline Hyclate   . Sulfonamide Derivatives    Family History  Problem Relation Age of Onset  . Heart disease Mother     mature heart disease  . Kidney disease Other     Past medical history, social, surgical and family history all reviewed in electronic medical record.  No pertanent information unless stated regarding to the chief complaint.   Review of Systems: No headache, visual changes, nausea, vomiting, diarrhea, constipation, dizziness, abdominal pain, skin rash, fevers, chills, night sweats, weight loss, swollen lymph nodes, body aches, joint swelling, muscle aches, chest pain, shortness of breath, mood changes.   Objective Blood pressure 136/78, pulse 68, height 5\' 10"  (1.778 m), weight 190 lb (86.183 kg), SpO2 97 %.  General: No apparent distress alert and oriented x3 mood and affect normal, dressed appropriately.  HEENT: Pupils equal, extraocular movements intact  Respiratory: Patient's speak in full sentences and does not appear short of breath  Cardiovascular: No lower extremity edema, non tender, no erythema  Skin: Warm dry intact with no signs of infection or rash on extremities or on axial skeleton.  Abdomen: Soft nontender  Neuro:  Cranial nerves II through XII are intact, neurovascularly intact in all extremities with 2+ DTRs and 2+ pulses.  Lymph: No lymphadenopathy of posterior or anterior cervical chain or axillae bilaterally.  Gait normal with good balance and coordination.  MSK:  Non tender with full range of motion and good stability and symmetric strength and tone of , elbows, wrist, hip, knee and ankles bilaterally. Arthritic changes of multiple joints Neck: Inspection unremarkable. No palpable stepoffs. Patient  does have severe Limited range of motion lacking the last 5 of flexion and only has 10 of extension. No side bending bilaterally. Grip strength and sensation normal in bilateral hands Strength good C4 to T1 distribution No sensory change to C4 to T1 Negative Hoffman sign bilaterally Reflexes normal Shoulder: Right atrophy of the musculature of the shoulder Tender to palpation diffusely Still limited range of motion noted in all planesWith forward flexion of 85, external rotation of 5 and internal rotation to sacrum. Rotator cuff strength 3/5 strength signs of impingement with positive Neer and Hawkin's tests, but negative empty can sign. Speeds and Yergason's tests negative today Positive labral pathology Normal scapular function observed. Positive drop arm sign present No apprehension sign Contralateral shoulder mild impingement signs but otherwise unremarkable. Mild worsening of previous exam    Impression and Recommendations:     This case required medical decision making of moderate complexity.      Note: This dictation was prepared with Dragon dictation along with smaller phrase technology. Any transcriptional errors that result from this process are unintentional.

## 2016-04-04 NOTE — Patient Instructions (Signed)
Good to see you  Ice is still your friend and use it after you do a lot of work.  Try gabapentin 100-200mg  at night to help with pain and see if it is coming from the neck  See me again in 6 weeks and if pain is worse then we will do injection.

## 2016-04-04 NOTE — Assessment & Plan Note (Signed)
I do believe the patient has rotator cuff arthropathy. Patient wants to hold on another injection at this time. Patient will continue with conservative therapy. There is a possibility for cervical radiculopathy with patient's history and will be started on gabapentin low dose. We discussed monitoring the neck and shoulder pain and see which one seems to be worse. Follow-up with me again in 6 weeks. At that time if having worsening symptoms we'll do injection in the shoulder.

## 2016-04-10 ENCOUNTER — Telehealth: Payer: Self-pay | Admitting: *Deleted

## 2016-04-10 NOTE — Telephone Encounter (Signed)
Requesting refill on hydrocodone...Johny Chess

## 2016-04-11 ENCOUNTER — Telehealth: Payer: Self-pay

## 2016-04-11 MED ORDER — HYDROCODONE-ACETAMINOPHEN 10-325 MG PO TABS: 1.0000 | ORAL_TABLET | Freq: Every evening | ORAL | Status: DC | PRN

## 2016-04-11 NOTE — Telephone Encounter (Signed)
Pls advise on msg below.../lmb 

## 2016-04-11 NOTE — Telephone Encounter (Signed)
Done hardcopy to Corinne  

## 2016-04-11 NOTE — Telephone Encounter (Signed)
Left message for patient stating that their prescription for the medication is up front in the office for pick up.

## 2016-05-18 ENCOUNTER — Telehealth: Payer: Self-pay | Admitting: Emergency Medicine

## 2016-05-18 MED ORDER — HYDROCODONE-ACETAMINOPHEN 10-325 MG PO TABS: 1.0000 | ORAL_TABLET | Freq: Every evening | ORAL | Status: DC | PRN

## 2016-05-18 NOTE — Telephone Encounter (Signed)
Please advise 

## 2016-05-18 NOTE — Telephone Encounter (Signed)
Done hardcopy to Corinne  

## 2016-05-18 NOTE — Telephone Encounter (Signed)
Medication placed up front for pick up

## 2016-05-18 NOTE — Telephone Encounter (Signed)
Pt called and needs a prescription refill on HYDROcodone-acetaminophen (NORCO) 10-325 MG tablet. Please follow up thanks.   

## 2016-05-22 NOTE — Progress Notes (Signed)
[name] [address] [place], [place] [address] Phone: [phone] Subjective:    CC: Right shoulder pain follow-up  [id number]  David Bolton is a 75 y.o. male coming in with complaint of right shoulder pain. Found to have severe rotator cuff arthropathy. Patient was given an injection going on for months ago. Patient was to do gabapentin at night to see if any of it was associated with some and neck pain. Patient was a continue conservative therapy otherwise. Patient states he was in [place] for 10 days. Didn't do a lot of more activity. States that his shoulders did not get any worse. 0.9 at there are any better though as well. Patient still has difficulty especially with the right arm with activities and has been using his left arm more. Still some discomfort at night. Does take pain medication at night to help with sleep. Patient states that the gabapentin has helped with head of the radicular symptoms. Overall he would state that he is stable.    Past Medical History:  Diagnosis Date  . ALLERGIC RHINITIS 06/08/2007  . ANXIETY 06/08/2007  . Cervicalgia 12/05/2007  . COLONIC POLYPS, HX OF 06/08/2007  . Cough 08/12/2010  . DEGENERATION, CERVICAL DISC 07/30/2007  . DEPENDENCE, ALCOHOL NEC/NOS, IN REMISSION 06/08/2007  . DEPRESSION 06/08/2007  . DIVERTICULOSIS, COLON 06/08/2007  . EPIGASTRIC TENDERNESS 12/05/2007  . FATIGUE 09/10/2008  . GERD 06/08/2007  . Headache(784.0) 06/08/2007  . HYPERLIPIDEMIA 06/08/2007  . HYPERTENSION 06/08/2007  . IBS 06/08/2007  . Impaired glucose tolerance 02/10/2011  . LOW BACK PAIN, CHRONIC 08/12/2010  . Nocturia 07/16/2009  . PILONIDAL CYST 06/08/2007  . PLANTAR FASCIITIS 06/08/2007   Past Surgical History:  Procedure Laterality Date  . bladder and prostate surgury    . CERVICAL FUSION    . pilonidal cyst surgury     Social History   Social History  . Marital status: Divorced    Spouse name: N/A  . Number of children: 3  . Years  of education: N/A   Occupational History  . retired Engineering geologist delivery    Social History Main Topics  . Smoking status: Never Smoker  . Smokeless tobacco: None  . Alcohol use No  . Drug use: No  . Sexual activity: Not Asked   Other Topics Concern  . None   Social History Narrative  . None   Allergies  Allergen Reactions  . Doxycycline Hyclate   . Sulfonamide Derivatives    Family History  Problem Relation Age of Onset  . Heart disease Mother     mature heart disease  . Kidney disease Other     Past medical history, social, surgical and family history all reviewed in electronic medical record.  No pertanent information unless stated regarding to the chief complaint.   Review of Systems: No headache, visual changes, nausea, vomiting, diarrhea, constipation, dizziness, abdominal pain, skin rash, fevers, chills, night sweats, weight loss, swollen lymph nodes, body aches, joint swelling, muscle aches, chest pain, shortness of breath, mood changes.   Objective  Blood pressure 138/82, pulse 72.  General: No apparent distress alert and oriented x3 mood and affect normal, dressed appropriately.  HEENT: Pupils equal, extraocular movements intact  Respiratory: Patient's speak in full sentences and does not appear short of breath  Cardiovascular: No lower extremity edema, non tender, no erythema  Skin: Warm dry intact with no signs of infection or rash on extremities or on axial skeleton.  Abdomen:  Soft nontender  Neuro: Cranial nerves II through XII are intact, neurovascularly intact in all extremities with 2+ DTRs and 2+ pulses.  Lymph: No lymphadenopathy of posterior or anterior cervical chain or axillae bilaterally.  Gait normal with good balance and coordination.  MSK:  Non tender with full range of motion and good stability and symmetric strength and tone of , elbows, wrist, hip, knee and ankles bilaterally. Arthritic changes of multiple  joints Neck: Inspection unremarkable. No palpable stepoffs. Patient does have severe Limited range of motion lacking the last 5 of flexion and only has 10 of extension. No side bending bilaterally. Grip strength and sensation normal in bilateral hands Strength good C4 to T1 distribution No sensory change to C4 to T1 Negative Hoffman sign bilaterally Reflexes normal Shoulder: Right atrophy of the musculature of the shoulder Less tenderness than prior exam Still limited range of motion noted in all planes with significant crepitus bilaterally Rotator cuff strength 3+/5 strength signs of impingement with positive Neer and Hawkin's tests, but negative empty can sign. Speeds and Yergason's tests negative today Positive labral pathology Normal scapular function observed. Positive drop arm sign present No apprehension sign Contralateral shoulder mild impingement signs but otherwise unremarkable. Mild worsening of previous exam    Impression and Recommendations:     This case required medical decision making of moderate complexity.      Note: This dictation was prepared with Dragon dictation along with smaller phrase technology. Any transcriptional errors that result from this process are unintentional.

## 2016-05-23 ENCOUNTER — Ambulatory Visit (INDEPENDENT_AMBULATORY_CARE_PROVIDER_SITE_OTHER): Payer: Medicare Other | Admitting: Family Medicine

## 2016-05-23 ENCOUNTER — Encounter: Payer: Self-pay | Admitting: Family Medicine

## 2016-05-23 VITALS — BP 138/82 | HR 72

## 2016-05-23 DIAGNOSIS — M542 Cervicalgia: Secondary | ICD-10-CM | POA: Diagnosis not present

## 2016-05-23 DIAGNOSIS — M5030 Other cervical disc degeneration, unspecified cervical region: Secondary | ICD-10-CM | POA: Diagnosis not present

## 2016-05-23 DIAGNOSIS — M12811 Other specific arthropathies, not elsewhere classified, right shoulder: Secondary | ICD-10-CM | POA: Diagnosis not present

## 2016-05-23 DIAGNOSIS — M75101 Unspecified rotator cuff tear or rupture of right shoulder, not specified as traumatic: Secondary | ICD-10-CM

## 2016-05-23 NOTE — Patient Instructions (Addendum)
Good to see you  Ice is your friend Gabapentin 100-300mg  at night Keep trucking along See me again in 6-8 weeks if you need injections.

## 2016-05-23 NOTE — Assessment & Plan Note (Signed)
Significant arthritic changes. Patient was also on injections again. We discussed can repeat this every 3-4 months if needed. States that he once a continue with conservative therapy. Declined formal physical therapy. Discussed icing regimen. We discussed crepitus and how that would be audible. Patient will continue the gabapentin. Follow-up again with me in 6-8 weeks.  Spent  25 minutes with patient face-to-face and had greater than 50% of counseling including as described above in assessment and plan.

## 2016-05-23 NOTE — Assessment & Plan Note (Signed)
Used again difficult he. We discussed continuing to try to work on posture. Ergonomics will be important. Patient will continue gabapentin. Follow-up again in 6-8 weeks

## 2016-06-16 ENCOUNTER — Telehealth: Payer: Self-pay | Admitting: *Deleted

## 2016-06-16 MED ORDER — HYDROCODONE-ACETAMINOPHEN 10-325 MG PO TABS
1.0000 | ORAL_TABLET | Freq: Every evening | ORAL | 0 refills | Status: DC | PRN
Start: 2016-06-16 — End: 2016-07-11

## 2016-06-16 NOTE — Telephone Encounter (Signed)
Notified pt rx ready for pick-up.../lmb 

## 2016-06-16 NOTE — Telephone Encounter (Signed)
Done hardcopy to Corinne  

## 2016-06-16 NOTE — Telephone Encounter (Signed)
Rec'd call pt requesting refill on his Hydrocodone.../lmb 

## 2016-07-11 ENCOUNTER — Other Ambulatory Visit (INDEPENDENT_AMBULATORY_CARE_PROVIDER_SITE_OTHER): Payer: Medicare Other

## 2016-07-11 ENCOUNTER — Ambulatory Visit (INDEPENDENT_AMBULATORY_CARE_PROVIDER_SITE_OTHER): Payer: Medicare Other | Admitting: Internal Medicine

## 2016-07-11 ENCOUNTER — Encounter: Payer: Self-pay | Admitting: Internal Medicine

## 2016-07-11 VITALS — BP 128/74 | HR 64 | Temp 97.9°F | Resp 20 | Wt 188.0 lb

## 2016-07-11 DIAGNOSIS — N320 Bladder-neck obstruction: Secondary | ICD-10-CM

## 2016-07-11 DIAGNOSIS — M545 Low back pain: Secondary | ICD-10-CM | POA: Diagnosis not present

## 2016-07-11 DIAGNOSIS — R300 Dysuria: Secondary | ICD-10-CM | POA: Diagnosis not present

## 2016-07-11 DIAGNOSIS — E785 Hyperlipidemia, unspecified: Secondary | ICD-10-CM | POA: Diagnosis not present

## 2016-07-11 DIAGNOSIS — E119 Type 2 diabetes mellitus without complications: Secondary | ICD-10-CM | POA: Diagnosis not present

## 2016-07-11 DIAGNOSIS — Z23 Encounter for immunization: Secondary | ICD-10-CM | POA: Diagnosis not present

## 2016-07-11 DIAGNOSIS — I10 Essential (primary) hypertension: Secondary | ICD-10-CM | POA: Diagnosis not present

## 2016-07-11 DIAGNOSIS — G8929 Other chronic pain: Secondary | ICD-10-CM

## 2016-07-11 LAB — BASIC METABOLIC PANEL
BUN: 12 mg/dL (ref 6–23)
CALCIUM: 9.5 mg/dL (ref 8.4–10.5)
CO2: 29 mEq/L (ref 19–32)
Chloride: 102 mEq/L (ref 96–112)
Creatinine, Ser: 0.8 mg/dL (ref 0.40–1.50)
GFR: 100.78 mL/min (ref >60.00)
GLUCOSE: 135 mg/dL — AB (ref 70–99)
Potassium: 4.7 mEq/L (ref 3.5–5.1)
SODIUM: 137 meq/L (ref 135–145)

## 2016-07-11 LAB — URINALYSIS, ROUTINE W REFLEX MICROSCOPIC
BILIRUBIN URINE: NEGATIVE
HGB URINE DIPSTICK: NEGATIVE
Ketones, ur: NEGATIVE
LEUKOCYTES UA: NEGATIVE
NITRITE: NEGATIVE
Specific Gravity, Urine: 1.02 (ref 1.000–1.030)
Total Protein, Urine: NEGATIVE
Urine Glucose: NEGATIVE
Urobilinogen, UA: 0.2 (ref 0.0–1.0)
pH: 6 (ref 5.0–8.0)

## 2016-07-11 LAB — CBC WITH DIFFERENTIAL/PLATELET
Basophils Absolute: 0 10*3/uL (ref 0.0–0.1)
Basophils Relative: 0.4 % (ref 0.0–3.0)
EOS PCT: 2 % (ref 0.0–5.0)
Eosinophils Absolute: 0.1 10*3/uL (ref 0.0–0.7)
HCT: 42.6 % (ref 39.0–52.0)
HEMOGLOBIN: 14.7 g/dL (ref 13.0–17.0)
LYMPHS ABS: 1.8 10*3/uL (ref 0.7–4.0)
Lymphocytes Relative: 24 % (ref 12.0–46.0)
MCHC: 34.5 g/dL (ref 30.0–36.0)
MCV: 88.7 fl (ref 78.0–100.0)
MONOS PCT: 9.7 % (ref 3.0–12.0)
Monocytes Absolute: 0.7 10*3/uL (ref 0.1–1.0)
NEUTROS PCT: 63.9 % (ref 43.0–77.0)
Neutro Abs: 4.7 10*3/uL (ref 1.4–7.7)
Platelets: 283 10*3/uL (ref 150.0–400.0)
RBC: 4.8 Mil/uL (ref 4.22–5.81)
RDW: 13 % (ref 11.5–15.5)
WBC: 7.4 10*3/uL (ref 4.0–10.5)

## 2016-07-11 LAB — LIPID PANEL
CHOL/HDL RATIO: 3
Cholesterol: 139 mg/dL (ref 0–200)
HDL: 48.4 mg/dL (ref >39.00)
LDL CALC: 67 mg/dL (ref 0–99)
NONHDL: 91.02
Triglycerides: 121 mg/dL (ref 0.0–149.0)
VLDL: 24.2 mg/dL (ref 0.0–40.0)

## 2016-07-11 LAB — HEPATIC FUNCTION PANEL
ALBUMIN: 4.5 g/dL (ref 3.5–5.2)
ALK PHOS: 57 U/L (ref 39–117)
ALT: 15 U/L (ref 0–53)
AST: 19 U/L (ref 0–37)
BILIRUBIN DIRECT: 0.2 mg/dL (ref 0.0–0.3)
Total Bilirubin: 0.7 mg/dL (ref 0.2–1.2)
Total Protein: 7.3 g/dL (ref 6.0–8.3)

## 2016-07-11 LAB — MICROALBUMIN / CREATININE URINE RATIO
Creatinine,U: 159.2 mg/dL
MICROALB UR: 2.6 mg/dL — AB (ref 0.0–1.9)
Microalb Creat Ratio: 1.6 mg/g (ref 0.0–30.0)

## 2016-07-11 LAB — PSA: PSA: 2.72 ng/mL (ref 0.10–4.00)

## 2016-07-11 LAB — TSH: TSH: 1.87 u[IU]/mL (ref 0.35–4.50)

## 2016-07-11 LAB — HEMOGLOBIN A1C: Hgb A1c MFr Bld: 6.8 % — ABNORMAL HIGH (ref 4.6–6.5)

## 2016-07-11 MED ORDER — HYDROCODONE-ACETAMINOPHEN 10-325 MG PO TABS: 1.0000 | ORAL_TABLET | Freq: Every evening | ORAL | 0 refills | Status: DC | PRN

## 2016-07-11 MED ORDER — CIPROFLOXACIN HCL 500 MG PO TABS: 500.0000 mg | ORAL_TABLET | Freq: Two times a day (BID) | ORAL | 0 refills | Status: AC

## 2016-07-11 NOTE — Progress Notes (Signed)
Pre visit review using our clinic review tool, if applicable. No additional management support is needed unless otherwise documented below in the visit note. 

## 2016-07-11 NOTE — Progress Notes (Signed)
Subjective:    Patient ID: David Bolton, male    DOB: 1941-08-31, 75 y.o.   MRN: [id number]  HPI  Here for yearly f/u;  Overall doing ok;  Pt denies Chest pain, worsening SOB, DOE, wheezing, orthopnea, PND, worsening LE edema, palpitations, dizziness or syncope.  Pt denies neurological change such as new headache, facial or extremity weakness.  Pt denies polydipsia, polyuria, or low sugar symptoms. Pt states overall good compliance with treatment and medications, good tolerability, and has been trying to follow appropriate diet.  Pt denies worsening depressive symptoms, suicidal ideation or panic. No fever, night sweats, wt loss, loss of appetite, or other constitutional symptoms.  Pt states good ability with ADL's, has low fall risk, home safety reviewed and adequate, no other significant changes in hearing or vision, and only occasionally active with exercise. Has had mild urinary symptoms such as dysuria, frequency in the past 3 days with nocturia, but no urgency, flank pain, hematuria or n/v, fever, chills. Had same situation last year, better with antibx/cipro. Wt Readings from Last 3 Encounters:  07/11/16 188 lb (85.3 kg)  04/04/16 190 lb (86.2 kg)  02/22/16 192 lb (87.1 kg)   Past Medical History:  Diagnosis Date  . ALLERGIC RHINITIS 06/08/2007  . ANXIETY 06/08/2007  . Cervicalgia 12/05/2007  . COLONIC POLYPS, HX OF 06/08/2007  . Cough 08/12/2010  . DEGENERATION, CERVICAL DISC 07/30/2007  . DEPENDENCE, ALCOHOL NEC/NOS, IN REMISSION 06/08/2007  . DEPRESSION 06/08/2007  . DIVERTICULOSIS, COLON 06/08/2007  . EPIGASTRIC TENDERNESS 12/05/2007  . FATIGUE 09/10/2008  . GERD 06/08/2007  . Headache(784.0) 06/08/2007  . HYPERLIPIDEMIA 06/08/2007  . HYPERTENSION 06/08/2007  . IBS 06/08/2007  . Impaired glucose tolerance 02/10/2011  . LOW BACK PAIN, CHRONIC 08/12/2010  . Nocturia 07/16/2009  . PILONIDAL CYST 06/08/2007  . PLANTAR FASCIITIS 06/08/2007   Past Surgical History:  Procedure Laterality Date  . bladder  and prostate surgury    . CERVICAL FUSION    . pilonidal cyst surgury      reports that he has never smoked. He does not have any smokeless tobacco history on file. He reports that he does not drink alcohol or use drugs. family history includes Heart disease in his mother; Kidney disease in his other. Allergies  Allergen Reactions  . Doxycycline Hyclate   . Sulfonamide Derivatives    Current Outpatient Prescriptions on File Prior to Visit  Medication Sig Dispense Refill  . amLODipine (NORVASC) 5 MG tablet TAKE 1 TABLET BY MOUTH ONCE A DAY 90 tablet 3  . aspirin EC 81 MG EC tablet Take 81 mg by mouth daily.      Marland Kitchen atorvastatin (LIPITOR) 20 MG tablet TAKE 1 TABLET BY MOUTH DAILY. 90 tablet 3  . DULoxetine (CYMBALTA) 30 MG capsule Take 1 capsule (30 mg total) by mouth daily. 90 capsule 3  . gabapentin (NEURONTIN) 100 MG capsule Take 1-2 capsules (100-200 mg total) by mouth at bedtime. 60 capsule 3  . Glucosamine 500 MG TABS Take by mouth.      . lansoprazole (PREVACID SOLUTAB) 30 MG disintegrating tablet Take 30 mg by mouth daily.      Marland Kitchen lidocaine-hydrocortisone (ANAMANTEL HC) 3-0.5 % CREA Place 1 Applicatorful rectally 2 (two) times daily. 28.35 g 1  . lisinopril (PRINIVIL,ZESTRIL) 40 MG tablet TAKE 1 TABLET BY MOUTH ONCE A DAY 90 tablet 2  . metFORMIN (GLUCOPHAGE-XR) 500 MG 24 hr tablet Take 2 tablets (1,000 mg total) by mouth daily with breakfast. 180 tablet 3  .  metoprolol succinate (TOPROL-XL) 50 MG 24 hr tablet TAKE 1/2 TABLET BY MOUTH ONCE A DAY 90 tablet 2  . Multiple Vitamin (MULTIVITAMIN) capsule Take 1 capsule by mouth daily.      . OMEGA 3 340 MG CPDR Take by mouth daily.      . ranitidine (ZANTAC) 150 MG tablet Take 1 tablet (150 mg total) by mouth daily as needed for heartburn. 90 tablet 3   No current facility-administered medications on file prior to visit.    Review of Systems  Constitutional: Negative for unusual diaphoresis or night sweats HENT: Negative for ear  swelling or discharge Eyes: Negative for worsening visual haziness  Respiratory: Negative for choking and stridor.   Gastrointestinal: Negative for distension or worsening eructation Genitourinary: Negative for retention or change in urine volume.  Musculoskeletal: Negative for other MSK pain or swelling Skin: Negative for color change and worsening wound Neurological: Negative for tremors and numbness other than noted  Psychiatric/Behavioral: Negative for decreased concentration or agitation other than above       Objective:   Physical Exam BP 128/74   Pulse 64   Temp 97.9 F (36.6 C) (Oral)   Resp 20   Wt 188 lb (85.3 kg)   SpO2 97%   BMI 26.98 kg/m  VS noted,  Constitutional: Pt appears in no apparent distress HENT: Head: NCAT.  Right Ear: External ear normal.  Left Ear: External ear normal.  Eyes: . Pupils are equal, round, and reactive to light. Conjunctivae and EOM are normal Neck: Normal range of motion. Neck supple.  Cardiovascular: Normal rate and regular rhythm.   Pulmonary/Chest: Effort normal and breath sounds without rales or wheezing.  Abd:  Soft, NT, ND, + BS, no flank tender Neurological: Pt is alert. Not confused , motor grossly intact Skin: Skin is warm. No rash, no LE edema Psychiatric: Pt behavior is normal. No agitation.     Assessment & Plan:

## 2016-07-11 NOTE — Patient Instructions (Addendum)
You had the flu shot today, and the pneumovax shot today  Please take all new medication as prescribed - the antibiotic  ,Please continue all other medications as before, and refills have been done if requested - the pain medications  Please have the pharmacy call with any other refills you may need.  Please continue your efforts at being more active, low cholesterol diet, and weight control.  You are otherwise up to date with prevention measures today.  Please keep your appointments with your specialists as you may have planned  Please go to the LAB in the Basement (turn left off the elevator) for the tests to be done today  You will be contacted by phone if any changes need to be made immediately.  Otherwise, you will receive a letter about your results with an explanation, but please check with MyChart first.  Please remember to sign up for MyChart if you have not done so, as this will be important to you in the future with finding out test results, communicating by private email, and scheduling acute appointments online when needed.  Please return in 6 months, or sooner if needed

## 2016-07-13 LAB — URINE CULTURE: Organism ID, Bacteria: NO GROWTH

## 2016-07-18 NOTE — Assessment & Plan Note (Signed)
stable overall by history and exam, recent data reviewed with pt, and pt to continue medical treatment as before,  to f/u any worsening symptoms or concerns Lab Results  Component Value Date   LDLCALC 67 07/11/2016

## 2016-07-18 NOTE — Assessment & Plan Note (Signed)
stable overall by history and exam, refills done and pt to continue medical treatment as before,  to f/u any worsening symptoms or concerns

## 2016-07-18 NOTE — Assessment & Plan Note (Signed)
liekly uti, for urine studies, empiric antibx, declines urology referral,  to f/u any worsening symptoms or concerns

## 2016-07-18 NOTE — Assessment & Plan Note (Signed)
stable overall by history and exam, recent data reviewed with pt, and pt to continue medical treatment as before,  to f/u any worsening symptoms or concerns BP Readings from Last 3 Encounters:  07/11/16 128/74  05/23/16 138/82  04/04/16 136/78

## 2016-07-18 NOTE — Assessment & Plan Note (Signed)
Also for psa as he is due,  to f/u any worsening symptoms or concerns  

## 2016-07-18 NOTE — Assessment & Plan Note (Signed)
stable overall by history and exam, recent data reviewed with pt, and pt to continue medical treatment as before,  to f/u any worsening symptoms or concerns Lab Results  Component Value Date   HGBA1C 6.8 (H) 07/11/2016    

## 2016-07-19 NOTE — Progress Notes (Signed)
[name] [name], [place] [address] Phone: [phone] Subjective:    CC: Right shoulder pain follow-up  [id number]  David Bolton is a 75 y.o. male coming in with complaint of right shoulder pain. Found to have severe rotator cuff arthropathy.patient also has some cervical radiculopathy.patient is on gabapentin has had some mild benefit with injections in the shoulder previously. Patient has declined formal physical therapy. Patient was to continue home exercises, icing protocol and range of motion exercises. Patient states Pain in the right shoulder seems to be worsening. States that he can't do even some simple daily activities such as stress some self at this moment. States that the pain is severe in all muscle throbbing sensation at night. Worse from his regular baseline even he states  Past Medical History:  Diagnosis Date  . ALLERGIC RHINITIS 06/08/2007  . ANXIETY 06/08/2007  . Cervicalgia 12/05/2007  . COLONIC POLYPS, HX OF 06/08/2007  . Cough 08/12/2010  . DEGENERATION, CERVICAL DISC 07/30/2007  . DEPENDENCE, ALCOHOL NEC/NOS, IN REMISSION 06/08/2007  . DEPRESSION 06/08/2007  . DIVERTICULOSIS, COLON 06/08/2007  . EPIGASTRIC TENDERNESS 12/05/2007  . FATIGUE 09/10/2008  . GERD 06/08/2007  . Headache(784.0) 06/08/2007  . HYPERLIPIDEMIA 06/08/2007  . HYPERTENSION 06/08/2007  . IBS 06/08/2007  . Impaired glucose tolerance 02/10/2011  . LOW BACK PAIN, CHRONIC 08/12/2010  . Nocturia 07/16/2009  . PILONIDAL CYST 06/08/2007  . PLANTAR FASCIITIS 06/08/2007   Past Surgical History:  Procedure Laterality Date  . bladder and prostate surgury    . CERVICAL FUSION    . pilonidal cyst surgury     Social History   Social History  . Marital status: Divorced    Spouse name: N/A  . Number of children: 3  . Years of education: N/A   Occupational History  . retired Engineering geologist delivery    Social History Main Topics  . Smoking status: Never  Smoker  . Smokeless tobacco: None  . Alcohol use No  . Drug use: No  . Sexual activity: Not Asked   Other Topics Concern  . None   Social History Narrative  . None   Allergies  Allergen Reactions  . Doxycycline Hyclate   . Sulfonamide Derivatives    Family History  Problem Relation Age of Onset  . Heart disease Mother     mature heart disease  . Kidney disease Other     Past medical history, social, surgical and family history all reviewed in electronic medical record.  No pertanent information unless stated regarding to the chief complaint.   Review of Systems: No headache, visual changes, nausea, vomiting, diarrhea, constipation, dizziness, abdominal pain, skin rash, fevers, chills, night sweats, weight loss, swollen lymph nodes, body aches, joint swelling, muscle aches, chest pain, shortness of breath, mood changes.   Objective  Blood pressure 130/84, pulse 61, weight 189 lb (85.7 kg), SpO2 97 %.  General: No apparent distress alert and oriented x3 mood and affect normal, dressed appropriately.  HEENT: Pupils equal, extraocular movements intact  Respiratory: Patient's speak in full sentences and does not appear short of breath  Cardiovascular: No lower extremity edema, non tender, no erythema  Skin: Warm dry intact with no signs of infection or rash on extremities or on axial skeleton.  Abdomen: Soft nontender  Neuro: Cranial nerves II through XII are intact, neurovascularly intact in all extremities with 2+ DTRs and 2+ pulses.  Lymph: No lymphadenopathy of posterior or anterior cervical chain  or axillae bilaterally.  Gait normal with good balance and coordination.  MSK:  Non tender with full range of motion and good stability and symmetric strength and tone of , elbows, wrist, hip, knee and ankles bilaterally. Arthritic changes of multiple joints Neck: Inspection unremarkable. No palpable stepoffs. Patient does have severe Limited range of motion lacking the last 5 of  flexion and only has 10 of extension. No side bending bilaterally. Grip strength and sensation normal in bilateral hands Strength good C4 to T1 distribution No sensory change to C4 to T1 Negative Hoffman sign bilaterally Reflexes normal Shoulder: Right atrophy of the musculature of the shoulder Less tenderness than prior exam Still limited range of motion noted in all planes with significant crepitus bilaterally Patient also has an effusion noted Rotator cuff strength 3/5 strength worsening at this time. signs of impingement with positive Neer and Hawkin's tests, but negative empty can sign. Speeds and Yergason's tests negative today Positive labral pathology Normal scapular function observed. Positive drop arm sign present Contralateral shoulder mild impingement signs but otherwise unremarkable. worsening of previous exam  Procedure: Real-time Ultrasound Guided Injection of right glenohumeral joint Device: GE Logiq E  Ultrasound guided injection is preferred based studies that show increased duration, increased effect, greater accuracy, decreased procedural pain, increased response rate with ultrasound guided versus blind injection.  Verbal informed consent obtained.  Time-out conducted.  Noted no overlying erythema, induration, or other signs of local infection.  Skin prepped in a sterile fashion.  Local anesthesia: Topical Ethyl chloride.  With sterile technique and under real time ultrasound guidance:  Joint visualized.  23g 1  inch needle inserted posterior approach. Pictures taken for needle placement. Patient did have injection of 4 cc of 0.5% Marcaine, and 1.0 cc of Kenalog 40 mg/dL. Completed without difficulty  Pain immediately resolved suggesting accurate placement of the medication.  Advised to call if fevers/chills, erythema, induration, drainage, or persistent bleeding.  Images permanently stored and available for review in the ultrasound unit.  Impression: Technically  successful ultrasound guided injection.    Impression and Recommendations:     This case required medical decision making of moderate complexity.      Note: This dictation was prepared with Dragon dictation along with smaller phrase technology. Any transcriptional errors that result from this process are unintentional.

## 2016-07-20 ENCOUNTER — Ambulatory Visit (INDEPENDENT_AMBULATORY_CARE_PROVIDER_SITE_OTHER): Payer: Medicare Other | Admitting: Family Medicine

## 2016-07-20 ENCOUNTER — Encounter: Payer: Self-pay | Admitting: Family Medicine

## 2016-07-20 ENCOUNTER — Other Ambulatory Visit: Payer: Self-pay

## 2016-07-20 VITALS — BP 130/84 | HR 61 | Wt 189.0 lb

## 2016-07-20 DIAGNOSIS — M25511 Pain in right shoulder: Secondary | ICD-10-CM | POA: Diagnosis not present

## 2016-07-20 DIAGNOSIS — M12811 Other specific arthropathies, not elsewhere classified, right shoulder: Secondary | ICD-10-CM

## 2016-07-20 DIAGNOSIS — M75101 Unspecified rotator cuff tear or rupture of right shoulder, not specified as traumatic: Secondary | ICD-10-CM

## 2016-07-20 NOTE — Patient Instructions (Addendum)
Good to see you as always.  We injected the shoulder today  Ice is your friend Keep movements within your peripheral vision.  Try not to lift heavy  Tylenol 500mg  3 times a day can help keep the pain to a dull roar.  See can repeat injection every 3 months if we need to

## 2016-07-20 NOTE — Assessment & Plan Note (Signed)
Worsening symptoms. Patient was injected today and tolerated the procedure well. We discussed icing regimen. Discussed a monitor blood sugars one closer over the course of next 3 days. We discussed topical anti-inflammatory's. Encourage him to continue the gabapentin. Patient is on Cymbalta. Patient come back and see me again in 3 months and we will repeat injection is necessary.

## 2016-09-13 ENCOUNTER — Other Ambulatory Visit: Payer: Self-pay | Admitting: Internal Medicine

## 2016-09-25 ENCOUNTER — Other Ambulatory Visit: Payer: Self-pay | Admitting: Internal Medicine

## 2016-10-16 ENCOUNTER — Telehealth: Payer: Self-pay | Admitting: *Deleted

## 2016-10-16 NOTE — Telephone Encounter (Signed)
Pt left msg on triage requesting refill on Hydrocodone. MD out of office today will hold until he return back in office tomorrow....Johny Chess

## 2016-10-17 MED ORDER — HYDROCODONE-ACETAMINOPHEN 10-325 MG PO TABS: 1.0000 | ORAL_TABLET | Freq: Every evening | ORAL | 0 refills | Status: DC | PRN

## 2016-10-17 NOTE — Telephone Encounter (Signed)
Placed up front for pick up patient is aware

## 2016-10-17 NOTE — Telephone Encounter (Signed)
Done hardcopy to Corinne  

## 2016-11-14 ENCOUNTER — Telehealth: Payer: Self-pay | Admitting: *Deleted

## 2016-11-14 MED ORDER — HYDROCODONE-ACETAMINOPHEN 10-325 MG PO TABS: 1.0000 | ORAL_TABLET | Freq: Every evening | ORAL | 0 refills | Status: DC | PRN

## 2016-11-14 NOTE — Telephone Encounter (Signed)
Done hardcopy to Corinne  

## 2016-11-14 NOTE — Telephone Encounter (Signed)
Rec'd call pt requesting refill on his hydrocodone...David Bolton

## 2016-11-14 NOTE — Telephone Encounter (Signed)
Advised patient ready for pick up  

## 2016-12-14 ENCOUNTER — Telehealth: Payer: Self-pay | Admitting: *Deleted

## 2016-12-14 MED ORDER — HYDROCODONE-ACETAMINOPHEN 10-325 MG PO TABS: 1.0000 | ORAL_TABLET | Freq: Every evening | ORAL | 0 refills | Status: DC | PRN

## 2016-12-14 NOTE — Telephone Encounter (Signed)
Notified pt rx ready for pick-up.../lmb 

## 2016-12-14 NOTE — Telephone Encounter (Signed)
[place] controlled substance database checked.  Ok to fill medication. rx printed 

## 2016-12-14 NOTE — Telephone Encounter (Signed)
Marya Amsler.. Dr. Quay Burow left without signing script can you re-print and sign pls...David Bolton

## 2016-12-14 NOTE — Telephone Encounter (Signed)
Medication printed and placed in folder for pickup.

## 2016-12-14 NOTE — Telephone Encounter (Signed)
Rec'd call pt requesting refill on his Hydrocodone MD out of office pls advise..../lmb 

## 2016-12-15 ENCOUNTER — Telehealth: Payer: Self-pay | Admitting: Internal Medicine

## 2016-12-15 NOTE — Telephone Encounter (Signed)
Called patient to schedule awv. Lvm for patient to call office to schedule appt.  °

## 2017-01-01 NOTE — Progress Notes (Addendum)
Subjective:   David Bolton is a 76 y.o. male who presents for an Initial Medicare Annual Wellness Visit.  Review of Systems  No ROS.  Medicare Wellness Visit.  Cardiac Risk Factors include: diabetes mellitus;dyslipidemia;hypertension;male gender;advanced age (>77men, >53 women) Sleep patterns: no sleep issues, feels rested on waking, gets up 1-2 times nightly to void and sleeps 8 hours nightly.   Home Safety/Smoke Alarms:  Feels safe in home. Smoke alarms in place.   Living environment; residence and Firearm Safety: [place], firearms stored safely. Lives alone Seat Belt Safety/Bike Helmet: Wears seat belt.   Counseling:   Eye Exam- 3 years ago, Patient states he will make an appointment Dental- every 6 months  Male:   CCS- Last 11/03/08, normal, recall 10 years.   PSA-  Lab Results  Component Value Date   PSA 2.72 07/11/2016   PSA 1.99 08/12/2014   PSA 1.94 08/06/2013       Objective:    Today's Vitals   01/02/17 0930 01/02/17 0933 01/02/17 1000  BP: (!) 156/92  (!) 160/94  Pulse: 62    Resp: 18    SpO2: 99%    Weight: 194 lb (88 kg)    Height: 5\' 10"  (1.778 m)    PainSc:  3     Body mass index is 27.84 kg/m.  Current Medications (verified) Outpatient Encounter Prescriptions as of 01/02/2017  Medication Sig  . amLODipine (NORVASC) 5 MG tablet TAKE 1 TABLET BY MOUTH ONCE A DAY  . aspirin EC 81 MG EC tablet Take 81 mg by mouth daily.    Marland Kitchen atorvastatin (LIPITOR) 20 MG tablet TAKE 1 TABLET BY MOUTH DAILY.  Marland Kitchen gabapentin (NEURONTIN) 100 MG capsule Take 1-2 capsules (100-200 mg total) by mouth at bedtime.  . Glucosamine 500 MG TABS Take by mouth.    Marland Kitchen HYDROcodone-acetaminophen (NORCO) 10-325 MG tablet Take 1 tablet by mouth at bedtime as needed.  Marland Kitchen lisinopril (PRINIVIL,ZESTRIL) 40 MG tablet TAKE 1 TABLET BY MOUTH ONCE A DAY  . metFORMIN (GLUCOPHAGE-XR) 500 MG 24 hr tablet Take 2 tablets (1,000 mg total) by mouth daily with breakfast.  . metoprolol succinate  (TOPROL-XL) 50 MG 24 hr tablet TAKE 1/2 TABLET BY MOUTH ONCE A DAY  . Multiple Vitamin (MULTIVITAMIN) capsule Take 1 capsule by mouth daily.    . OMEGA 3 340 MG CPDR Take by mouth daily.    . ranitidine (ZANTAC) 150 MG tablet Take 1 tablet (150 mg total) by mouth daily as needed for heartburn.  . [DISCONTINUED] DULoxetine (CYMBALTA) 30 MG capsule Take 1 capsule (30 mg total) by mouth daily. (Patient not taking: Reported on 01/02/2017)  . [DISCONTINUED] lansoprazole (PREVACID SOLUTAB) 30 MG disintegrating tablet Take 30 mg by mouth daily.    . [DISCONTINUED] lidocaine-hydrocortisone (ANAMANTEL HC) 3-0.5 % CREA Place 1 Applicatorful rectally 2 (two) times daily. (Patient not taking: Reported on 01/02/2017)   No facility-administered encounter medications on file as of 01/02/2017.     Allergies (verified) Doxycycline hyclate; Peanut-containing drug products; and Sulfonamide derivatives   History: Past Medical History:  Diagnosis Date  . ALLERGIC RHINITIS 06/08/2007  . ANXIETY 06/08/2007  . Cervicalgia 12/05/2007  . COLONIC POLYPS, HX OF 06/08/2007  . Cough 08/12/2010  . DEGENERATION, CERVICAL DISC 07/30/2007  . DEPENDENCE, ALCOHOL NEC/NOS, IN REMISSION 06/08/2007  . DEPRESSION 06/08/2007  . DIVERTICULOSIS, COLON 06/08/2007  . EPIGASTRIC TENDERNESS 12/05/2007  . FATIGUE 09/10/2008  . GERD 06/08/2007  . Headache(784.0) 06/08/2007  . HYPERLIPIDEMIA 06/08/2007  .  HYPERTENSION 06/08/2007  . IBS 06/08/2007  . Impaired glucose tolerance 02/10/2011  . LOW BACK PAIN, CHRONIC 08/12/2010  . Nocturia 07/16/2009  . PILONIDAL CYST 06/08/2007  . PLANTAR FASCIITIS 06/08/2007   Past Surgical History:  Procedure Laterality Date  . bladder and prostate surgury    . CERVICAL FUSION    . pilonidal cyst surgury     Family History  Problem Relation Age of Onset  . Heart disease Mother     mature heart disease  . Kidney disease Other   . Heart attack Paternal Aunt    Social History   Occupational History  . retired Stage manager delivery    Social History Main Topics  . Smoking status: Never Smoker  . Smokeless tobacco: Never Used  . Alcohol use No  . Drug use: No  . Sexual activity: Not on file   Tobacco Counseling Counseling given: Not Answered   Activities of Daily Living In your present state of health, do you have any difficulty performing the following activities: 01/02/2017  Hearing? N  Vision? N  Difficulty concentrating or making decisions? N  Walking or climbing stairs? N  Dressing or bathing? N  Doing errands, shopping? N  Preparing Food and eating ? N  Using the Toilet? N  In the past six months, have you accidently leaked urine? N  Do you have problems with loss of bowel control? N  Managing your Medications? N  Managing your Finances? N  Housekeeping or managing your Housekeeping? N  Some recent data might be hidden    Immunizations and Health Maintenance Immunization History  Administered Date(s) Administered  . Influenza Split 08/14/2011, 08/02/2012  . Influenza Whole 09/16/2004, 08/04/2008, 08/12/2010  . Influenza, High Dose Seasonal PF 08/19/2015  . Influenza,inj,Quad PF,36+ Mos 08/06/2013, 08/12/2014  . Pneumococcal Conjugate-13 08/06/2013  . Pneumococcal Polysaccharide-23 09/10/2008, 07/11/2016  . Td 10/30/1993, 09/10/2008   Health Maintenance Due  Topic Date Due  . OPHTHALMOLOGY EXAM  10/01/1951  . INFLUENZA VACCINE  05/30/2016    Patient Care Team: Biagio Borg, MD as PCP - General  Indicate any recent Medical Services you may have received from other than [facility] providers in the past year (date may be approximate).    Assessment:   This is a routine wellness examination for David Bolton. Physical assessment deferred to PCP.   Hearing/Vision screen Hearing Screening Comments: Able to hear conversational tones w/o difficulty. No issues reported.   Vision Screening Comments: Wears reading glasses  Dietary issues and exercise activities  discussed: Current Exercise Habits: Structured exercise class;Home exercise routine, Type of exercise: walking, Time (Minutes): 40, Frequency (Times/Week): 6, Weekly Exercise (Minutes/Week): 240, Intensity: Mild, Exercise limited by: None identified  Diet (meal preparation, eat out, water intake, caffeinated beverages, dairy products, fruits and vegetables): in general, a "healthy" diet  , well balanced, diabetic, low fat/ cholesterol, low salt 2 cups of coffee per day, 6-8 bottles of water per day.  Discussed with patient to read labels and encouraged him to eat low salt, diabetic, and low cholesterol diet. Goals    . I would like to stay healthy and live a long life          Eat healthy, limit fast food, read labels to limit salt and sugar.       Depression Screen PHQ 2/9 Scores 01/02/2017 07/11/2016 01/10/2016 08/19/2015  PHQ - 2 Score 0 1 1 0    Fall Risk Fall Risk  01/02/2017 07/11/2016 01/10/2016 08/19/2015 02/11/2015  Falls in the past year? Yes No No No No  Number falls in past yr: 1 - - - -  Follow up Falls prevention discussed - - - -    Cognitive Function:       Ad8 score reviewed for issues:  Issues making decisions:no  Less interest in hobbies / activities: no  Repeats questions, stories (family complaining):no  Trouble using ordinary gadgets (microwave, computer, phone): no  Forgets the month or year: no  Mismanaging finances: no  Remembering appts:no  Daily problems with thinking and/or memory: no Ad8 score is= 0     Screening Tests Health Maintenance  Topic Date Due  . OPHTHALMOLOGY EXAM  10/01/1951  . INFLUENZA VACCINE  05/30/2016  . HEMOGLOBIN A1C  01/08/2017  . FOOT EXAM  01/09/2017  . TETANUS/TDAP  09/10/2018  . COLONOSCOPY  11/03/2018  . PNA vac Low Risk Adult  Completed        Plan:    Continue doing brain stimulating activities (puzzles, reading, adult coloring books, staying active) to keep memory sharp.   Patient states that he  continues to have shoulder pain. Scheduled an appointment with Dr. Tamala Julian 01/12/17  Patient's blood pressure is elevated today. Discussed for patient to take his blood pressure daily and record value to share with Dr. Jenny Reichmann on patient's upcoming visit 01/09/17. Encouraged patient to strictly monitor his salt intake and to continue exercising.  During the course of the visit David Bolton was educated and counseled about the following appropriate screening and preventive services:   Vaccines to include Pneumoccal, Influenza, Hepatitis B, Td, Zostavax, HCV  Colorectal cancer screening  Cardiovascular disease screening  Diabetes screening  Glaucoma screening  Nutrition counseling  Prostate cancer screening   Patient Instructions (the written plan) were given to the patient.   Michiel Cowboy, RN   01/02/2017    Medical screening examination/treatment/procedure(s) were performed by non-physician practitioner and as supervising physician I was immediately available for consultation/collaboration. I agree with above. Cathlean Cower, MD

## 2017-01-01 NOTE — Progress Notes (Signed)
Pre visit review using our clinic review tool, if applicable. No additional management support is needed unless otherwise documented below in the visit note. 

## 2017-01-02 ENCOUNTER — Ambulatory Visit (INDEPENDENT_AMBULATORY_CARE_PROVIDER_SITE_OTHER): Payer: Medicare Other | Admitting: *Deleted

## 2017-01-02 VITALS — BP 160/94 | HR 62 | Resp 18 | Ht 70.0 in | Wt 194.0 lb

## 2017-01-02 DIAGNOSIS — Z0000 Encounter for general adult medical examination without abnormal findings: Secondary | ICD-10-CM

## 2017-01-02 NOTE — Patient Instructions (Addendum)
Continue doing brain stimulating activities (puzzles, reading, adult coloring books, staying active) to keep memory sharp.    David Bolton , Thank you for taking time to come for your Medicare Wellness Visit. I appreciate your ongoing commitment to your health goals. Please review the following plan we discussed and let me know if I can assist you in the future.   These are the goals we discussed: Goals    . I would like to stay healthy live a long life          Eat healthy, limit fast food, read labels to limit salt and sugar.        This is a list of the screening recommended for you and due dates:  Health Maintenance  Topic Date Due  . Eye exam for diabetics  10/01/1951  . Flu Shot  05/30/2016  . Hemoglobin A1C  01/08/2017  . Complete foot exam   01/09/2017  . Tetanus Vaccine  09/10/2018  . Colon Cancer Screening  11/03/2018  . Pneumonia vaccines  Completed

## 2017-01-09 ENCOUNTER — Ambulatory Visit: Payer: Medicare Other | Admitting: Internal Medicine

## 2017-01-11 NOTE — Progress Notes (Signed)
[facility] [place], [place] [address] Phone: [phone] Subjective:    I'm seeing this patient by the request  of:    CC: Right shoulder pain  [id number]  David Bolton is a 76 y.o. male coming in with complaint of right shoulder pain. We have seen patient previously and was diagnosed with a rotator cuff arthropathy. Patient didn't need an aspiration as well as an injection back on 07/20/2016. Patient was doing significantly better. Patient states Was doing very well until the last couple months. Started having more pain again. Patient discusses last 2 months start having worsening pain again. Patient describes the pain as a dull, throbbing aching pain. Having difficulty with range of motion. Can wake him up at night. Has responded well to the injections previously and is wondering if there is any chance for another one.     Past Medical History:  Diagnosis Date  . ALLERGIC RHINITIS 06/08/2007  . ANXIETY 06/08/2007  . Cervicalgia 12/05/2007  . COLONIC POLYPS, HX OF 06/08/2007  . Cough 08/12/2010  . DEGENERATION, CERVICAL DISC 07/30/2007  . DEPENDENCE, ALCOHOL NEC/NOS, IN REMISSION 06/08/2007  . DEPRESSION 06/08/2007  . DIVERTICULOSIS, COLON 06/08/2007  . EPIGASTRIC TENDERNESS 12/05/2007  . FATIGUE 09/10/2008  . GERD 06/08/2007  . Headache(784.0) 06/08/2007  . HYPERLIPIDEMIA 06/08/2007  . HYPERTENSION 06/08/2007  . IBS 06/08/2007  . Impaired glucose tolerance 02/10/2011  . LOW BACK PAIN, CHRONIC 08/12/2010  . Nocturia 07/16/2009  . PILONIDAL CYST 06/08/2007  . PLANTAR FASCIITIS 06/08/2007   Past Surgical History:  Procedure Laterality Date  . bladder and prostate surgury    . CERVICAL FUSION    . pilonidal cyst surgury     Social History   Social History  . Marital status: Divorced    Spouse name: N/A  . Number of children: 3  . Years of education: N/A   Occupational History  . retired Engineering geologist delivery    Social History  Main Topics  . Smoking status: Never Smoker  . Smokeless tobacco: Never Used  . Alcohol use No  . Drug use: No  . Sexual activity: Not Asked   Other Topics Concern  . None   Social History Narrative  . None   Allergies  Allergen Reactions  . Doxycycline Hyclate   . Peanut-Containing Drug Products     Nose bleed, headache  . Sulfonamide Derivatives    Family History  Problem Relation Age of Onset  . Heart disease Mother     mature heart disease  . Kidney disease Other   . Heart attack Paternal Aunt     Past medical history, social, surgical and family history all reviewed in electronic medical record.  No pertanent information unless stated regarding to the chief complaint.   Review of Systems: No headache, visual changes, nausea, vomiting, diarrhea, constipation, dizziness, abdominal pain, skin rash, fevers, chills, night sweats, weight loss, swollen lymph nodes, joint swelling,chest pain, shortness of breath, mood changes.    Objective  Blood pressure 132/86, pulse 72, height 5\' 10"  (1.778 m), weight 191 lb 12.8 oz (87 kg), SpO2 97 %. Systems examined below as of 01/12/17   General: No apparent distress alert and oriented x3 mood and affect normal, dressed appropriately.  HEENT: Pupils equal, extraocular movements intact  Respiratory: Patient's speak in full sentences and does not appear short of breath  Cardiovascular: No lower extremity edema, non tender, no erythema  Skin: Warm dry intact with  no signs of infection or rash on extremities or on axial skeleton.  Abdomen: Soft nontender  Neuro: Cranial nerves II through XII are intact, neurovascularly intact in all extremities with 2+ DTRs and 2+ pulses.  Lymph: No lymphadenopathy of posterior or anterior cervical chain or axillae bilaterally.  Gait normal with good balance and coordination.  MSK:  Non tender with full range of motion and good stability and symmetric strength and tone of  elbows, wrist, hip, knee and  ankles bilaterally. Arthritic changes of multiple joints  Shoulder: Right Atrophy noted compared to the contralateral side Diffuse tenderness noted Mild lack of active range of motion in all planes. Patient does have crepitus Talar cuff strength 4 out of 5 compared to the contralateral side. Significant impingement Speeds and Yergason's tests normal. No labral pathology noted with negative Obrien's, negative clunk and good stability. Positive painful arc   After informed written and verbal consent, patient was seated on exam table. Right shoulder was prepped with alcohol swab and utilizing posterior approach, patient's right glenohumeral space was injected with 4:1  marcaine 0.5%: Kenalog 40mg /dL. Patient tolerated the procedure well without immediate complications.       Impression and Recommendations:     This case required medical decision making of moderate complexity.      Note: This dictation was prepared with Dragon dictation along with smaller phrase technology. Any transcriptional errors that result from this process are unintentional.

## 2017-01-12 ENCOUNTER — Encounter: Payer: Self-pay | Admitting: Family Medicine

## 2017-01-12 ENCOUNTER — Ambulatory Visit (INDEPENDENT_AMBULATORY_CARE_PROVIDER_SITE_OTHER): Payer: Medicare Other | Admitting: Family Medicine

## 2017-01-12 DIAGNOSIS — M12811 Other specific arthropathies, not elsewhere classified, right shoulder: Secondary | ICD-10-CM

## 2017-01-12 DIAGNOSIS — M75101 Unspecified rotator cuff tear or rupture of right shoulder, not specified as traumatic: Principal | ICD-10-CM

## 2017-01-12 NOTE — Patient Instructions (Signed)
Good to see you  Overall you are doing great  With the vitmamin D lets increase to 4000 IU daily for 1 month then 2000 IU daily thereafter.  I injected the shoulder again and should do well.  I can repeat injection every 10 weeks if needed.  See me again in 10-12 weeks.

## 2017-01-12 NOTE — Assessment & Plan Note (Signed)
Patient had worsening symptoms. Injected today. Didn't tolerate very well. Patient did have worsening range of motion and strength. We discussed icing regimen, home exercises, as well as topical anti-inflammatories. We discussed with patient that if any worsening symptoms we may need to consider injection again. Can repeat every 10 weeks if needed. Patient will follow-up on an as-needed basis.  Spent  25 minutes with patient face-to-face and had greater than 50% of counseling including as described above in assessment and plan.

## 2017-01-16 ENCOUNTER — Encounter: Payer: Self-pay | Admitting: Internal Medicine

## 2017-01-16 ENCOUNTER — Ambulatory Visit (INDEPENDENT_AMBULATORY_CARE_PROVIDER_SITE_OTHER): Payer: Medicare Other | Admitting: Internal Medicine

## 2017-01-16 ENCOUNTER — Other Ambulatory Visit (INDEPENDENT_AMBULATORY_CARE_PROVIDER_SITE_OTHER): Payer: Medicare Other

## 2017-01-16 VITALS — BP 128/84 | HR 65 | Temp 97.8°F | Ht 70.0 in | Wt 188.0 lb

## 2017-01-16 DIAGNOSIS — E119 Type 2 diabetes mellitus without complications: Secondary | ICD-10-CM | POA: Diagnosis not present

## 2017-01-16 DIAGNOSIS — F411 Generalized anxiety disorder: Secondary | ICD-10-CM

## 2017-01-16 DIAGNOSIS — R972 Elevated prostate specific antigen [PSA]: Secondary | ICD-10-CM | POA: Diagnosis not present

## 2017-01-16 DIAGNOSIS — I10 Essential (primary) hypertension: Secondary | ICD-10-CM | POA: Diagnosis not present

## 2017-01-16 LAB — BASIC METABOLIC PANEL
BUN: 14 mg/dL (ref 6–23)
CALCIUM: 9.7 mg/dL (ref 8.4–10.5)
CO2: 25 mEq/L (ref 19–32)
Chloride: 102 mEq/L (ref 96–112)
Creatinine, Ser: 0.82 mg/dL (ref 0.40–1.50)
GFR: 97.81 mL/min (ref >60.00)
Glucose, Bld: 190 mg/dL — ABNORMAL HIGH (ref 70–99)
POTASSIUM: 4.2 meq/L (ref 3.5–5.1)
SODIUM: 135 meq/L (ref 135–145)

## 2017-01-16 LAB — LIPID PANEL
CHOL/HDL RATIO: 3
Cholesterol: 151 mg/dL (ref 0–200)
HDL: 56.4 mg/dL (ref >39.00)
LDL CALC: 69 mg/dL (ref 0–99)
NonHDL: 94.33
TRIGLYCERIDES: 125 mg/dL (ref 0.0–149.0)
VLDL: 25 mg/dL (ref 0.0–40.0)

## 2017-01-16 LAB — HEMOGLOBIN A1C: Hgb A1c MFr Bld: 8.1 % — ABNORMAL HIGH (ref 4.6–6.5)

## 2017-01-16 LAB — HEPATIC FUNCTION PANEL
ALBUMIN: 4.3 g/dL (ref 3.5–5.2)
ALT: 21 U/L (ref 0–53)
AST: 24 U/L (ref 0–37)
Alkaline Phosphatase: 62 U/L (ref 39–117)
BILIRUBIN TOTAL: 1 mg/dL (ref 0.2–1.2)
Bilirubin, Direct: 0.2 mg/dL (ref 0.0–0.3)
Total Protein: 7.2 g/dL (ref 6.0–8.3)

## 2017-01-16 MED ORDER — HYDROCODONE-ACETAMINOPHEN 10-325 MG PO TABS: 1.0000 | ORAL_TABLET | Freq: Every evening | ORAL | 0 refills | Status: DC | PRN

## 2017-01-16 NOTE — Patient Instructions (Addendum)
Please remember to make your yearly opthalmology appt  Please continue all other medications as before, and refills have been done if requested.  Please have the pharmacy call with any other refills you may need.  Please continue your efforts at being more active, low cholesterol diet, and weight control..  Please keep your appointments with your specialists as you may have planned  Please go to the LAB in the Basement (turn left off the elevator) for the tests to be done today  You will be contacted by phone if any changes need to be made immediately.  Otherwise, you will receive a letter about your results with an explanation, but please check with MyChart first.  Please remember to sign up for MyChart if you have not done so, as this will be important to you in the future with finding out test results, communicating by private email, and scheduling acute appointments online when needed.  Please return in 6 months, or sooner if needed

## 2017-01-16 NOTE — Progress Notes (Signed)
Subjective:    Patient ID: David Bolton, male    DOB: Mar 14, 1941, 76 y.o.   MRN: [id number]  HPI  Here to f/u; overall doing ok,  Pt denies chest pain, increasing sob or doe, wheezing, orthopnea, PND, increased LE swelling, palpitations, dizziness or syncope.  Pt denies new neurological symptoms such as new headache, or facial or extremity weakness or numbness.  Pt denies polydipsia, polyuria, or low sugar episode.   Pt denies new neurological symptoms such as new headache, or facial or extremity weakness or numbness.   Pt states overall good compliance with meds, mostly trying to follow appropriate diet, with wt overall stable  Tries to walk 2-3 miles per day despite achy knees after. Denies worsening depressive symptoms, suicidal ideation, or panic, anxiety overall stable Wt Readings from Last 3 Encounters:  01/16/17 188 lb (85.3 kg)  01/12/17 191 lb 12.8 oz (87 kg)  01/02/17 194 lb (88 kg)  Due for optho f/u.   Past Medical History:  Diagnosis Date  . ALLERGIC RHINITIS 06/08/2007  . ANXIETY 06/08/2007  . Cervicalgia 12/05/2007  . COLONIC POLYPS, HX OF 06/08/2007  . Cough 08/12/2010  . DEGENERATION, CERVICAL DISC 07/30/2007  . DEPENDENCE, ALCOHOL NEC/NOS, IN REMISSION 06/08/2007  . DEPRESSION 06/08/2007  . DIVERTICULOSIS, COLON 06/08/2007  . EPIGASTRIC TENDERNESS 12/05/2007  . FATIGUE 09/10/2008  . GERD 06/08/2007  . Headache(784.0) 06/08/2007  . HYPERLIPIDEMIA 06/08/2007  . HYPERTENSION 06/08/2007  . IBS 06/08/2007  . Impaired glucose tolerance 02/10/2011  . LOW BACK PAIN, CHRONIC 08/12/2010  . Nocturia 07/16/2009  . PILONIDAL CYST 06/08/2007  . PLANTAR FASCIITIS 06/08/2007   Past Surgical History:  Procedure Laterality Date  . bladder and prostate surgury    . CERVICAL FUSION    . pilonidal cyst surgury      reports that he has never smoked. He has never used smokeless tobacco. He reports that he does not drink alcohol or use drugs. family history includes Heart attack in his paternal aunt; Heart  disease in his mother; Kidney disease in his other. Allergies  Allergen Reactions  . Doxycycline Hyclate   . Peanut-Containing Drug Products     Nose bleed, headache  . Sulfonamide Derivatives    Current Outpatient Prescriptions on File Prior to Visit  Medication Sig Dispense Refill  . amLODipine (NORVASC) 5 MG tablet TAKE 1 TABLET BY MOUTH ONCE A DAY 90 tablet 3  . aspirin EC 81 MG EC tablet Take 81 mg by mouth daily.      Marland Kitchen atorvastatin (LIPITOR) 20 MG tablet TAKE 1 TABLET BY MOUTH DAILY. 90 tablet 3  . cholecalciferol (VITAMIN D) 400 units TABS tablet Take 400 Units by mouth.    . gabapentin (NEURONTIN) 100 MG capsule Take 1-2 capsules (100-200 mg total) by mouth at bedtime. 60 capsule 3  . Glucosamine 500 MG TABS Take by mouth.      Marland Kitchen lisinopril (PRINIVIL,ZESTRIL) 40 MG tablet TAKE 1 TABLET BY MOUTH ONCE A DAY 90 tablet 2  . metoprolol succinate (TOPROL-XL) 50 MG 24 hr tablet TAKE 1/2 TABLET BY MOUTH ONCE A DAY 90 tablet 2  . Multiple Vitamin (MULTIVITAMIN) capsule Take 1 capsule by mouth daily.      . OMEGA 3 340 MG CPDR Take by mouth daily.      . ranitidine (ZANTAC) 150 MG tablet Take 1 tablet (150 mg total) by mouth daily as needed for heartburn. 90 tablet 3   No current facility-administered medications on file prior to visit.  Review of Systems  Constitutional: Negative for unusual diaphoresis or night sweats HENT: Negative for ear swelling or discharge Eyes: Negative for worsening visual haziness  Respiratory: Negative for choking and stridor.   Gastrointestinal: Negative for distension or worsening eructation Genitourinary: Negative for retention or change in urine volume.  Musculoskeletal: Negative for other MSK pain or swelling Skin: Negative for color change and worsening wound Neurological: Negative for tremors and numbness other than noted  Psychiatric/Behavioral: Negative for decreased concentration or agitation other than above   All other system neg per pt      Objective:   Physical Exam BP 128/84   Pulse 65   Temp 97.8 F (36.6 C) (Oral)   Ht 5\' 10"  (1.778 m)   Wt 188 lb (85.3 kg)   SpO2 98%   BMI 26.98 kg/m  VS noted,  Constitutional: Pt appears in no apparent distress HENT: Head: NCAT.  Right Ear: External ear normal.  Left Ear: External ear normal.  Eyes: . Pupils are equal, round, and reactive to light. Conjunctivae and EOM are normal Neck: Normal range of motion. Neck supple.  Cardiovascular: Normal rate and regular rhythm.   Pulmonary/Chest: Effort normal and breath sounds without rales or wheezing.  Abd:  Soft, NT, ND, + BS Neurological: Pt is alert. Not confused , motor grossly intact Skin: Skin is warm. No rash, no LE edema Psychiatric: Pt behavior is normal. No agitation.  No other exam findings    Assessment & Plan:

## 2017-01-16 NOTE — Progress Notes (Signed)
Pre visit review using our clinic review tool, if applicable. No additional management support is needed unless otherwise documented below in the visit note. 

## 2017-01-17 ENCOUNTER — Other Ambulatory Visit: Payer: Self-pay | Admitting: Internal Medicine

## 2017-01-17 ENCOUNTER — Telehealth: Payer: Self-pay

## 2017-01-17 MED ORDER — METFORMIN HCL ER 500 MG PO TB24: 1500.0000 mg | ORAL_TABLET | Freq: Every day | ORAL | 3 refills | Status: DC

## 2017-01-17 NOTE — Telephone Encounter (Signed)
Patient called back, read note from Dr. Jenny Reichmann regarding lab results and increase in Metformin and advised patient to review both in [place]. Pt understood.

## 2017-01-17 NOTE — Telephone Encounter (Signed)
Called pt, LVM.   

## 2017-01-17 NOTE — Telephone Encounter (Signed)
-----   Message from Biagio Borg, MD sent at 01/17/2017  8:11 AM EDT ----- Left message on MyChart, pt to cont same tx except  The test results show that your current treatment is OK, except the A1c is too high.  We need to increase the metformin to 3 pills in the am..    There is no other need for change of treatment or further evaluation based on these results, at this time.    Shirron to please inform pt, I will do rx

## 2017-01-21 NOTE — Assessment & Plan Note (Signed)
stable overall by history and exam, recent data reviewed with pt, and pt to continue medical treatment as before,  to f/u any worsening symptoms or concerns BP Readings from Last 3 Encounters:  01/16/17 128/84  01/12/17 132/86  01/02/17 (!) 160/94

## 2017-01-21 NOTE — Assessment & Plan Note (Signed)
Lab Results  Component Value Date   PSA 2.72 07/11/2016   PSA 1.99 08/12/2014   PSA 1.94 08/06/2013  mild worsening last visit, for psa, consider urology consult

## 2017-01-21 NOTE — Assessment & Plan Note (Signed)
stable overall by history and exam, and pt to continue medical treatment as before,  to f/u any worsening symptoms or concerns, for f/u lab today, goal < 7, to cont diet and wt control efforts

## 2017-01-21 NOTE — Assessment & Plan Note (Signed)
stable overall by history and exam, and pt to continue medical treatment as before,  to f/u any worsening symptoms or concerns 

## 2017-03-12 ENCOUNTER — Other Ambulatory Visit: Payer: Self-pay | Admitting: Internal Medicine

## 2017-04-17 ENCOUNTER — Telehealth: Payer: Self-pay | Admitting: Internal Medicine

## 2017-04-17 MED ORDER — HYDROCODONE-ACETAMINOPHEN 10-325 MG PO TABS: 1.0000 | ORAL_TABLET | Freq: Every evening | ORAL | 0 refills | Status: DC | PRN

## 2017-04-17 NOTE — Telephone Encounter (Signed)
Informed pt Script at front desk  

## 2017-04-17 NOTE — Telephone Encounter (Signed)
Done hardcopy to Shirron  

## 2017-04-17 NOTE — Telephone Encounter (Signed)
Pt would like a refill of HYDROcodone-acetaminophen (NORCO) 10-325 MG tablet

## 2017-05-16 ENCOUNTER — Telehealth: Payer: Self-pay | Admitting: Internal Medicine

## 2017-05-16 MED ORDER — HYDROCODONE-ACETAMINOPHEN 10-325 MG PO TABS: 1.0000 | ORAL_TABLET | Freq: Every evening | ORAL | 0 refills | Status: DC | PRN

## 2017-05-16 NOTE — Telephone Encounter (Signed)
Done hardcopy to Shirron  

## 2017-05-16 NOTE — Telephone Encounter (Signed)
Informed patient. Script at front desk.

## 2017-05-16 NOTE — Telephone Encounter (Signed)
Pt called in for a refill on his hydrocodone   Call him when it is ready

## 2017-06-18 ENCOUNTER — Telehealth: Payer: Self-pay | Admitting: Internal Medicine

## 2017-06-18 NOTE — Telephone Encounter (Signed)
Check [place] registry last filled 05/17/2017 @ [facility].../lmb

## 2017-06-18 NOTE — Telephone Encounter (Signed)
Patient is requesting refill on hydrocodone  °

## 2017-06-19 MED ORDER — HYDROCODONE-ACETAMINOPHEN 10-325 MG PO TABS: 1.0000 | ORAL_TABLET | Freq: Every evening | ORAL | 0 refills | Status: DC | PRN

## 2017-06-19 NOTE — Telephone Encounter (Signed)
Called pt, informed script was ready via VM Script at front desk

## 2017-06-19 NOTE — Telephone Encounter (Signed)
Done hardcopy to Shirron  

## 2017-07-19 ENCOUNTER — Other Ambulatory Visit: Payer: Self-pay | Admitting: Internal Medicine

## 2017-07-19 MED ORDER — HYDROCODONE-ACETAMINOPHEN 10-325 MG PO TABS: 1.0000 | ORAL_TABLET | Freq: Every evening | ORAL | 0 refills | Status: DC | PRN

## 2017-07-19 NOTE — Telephone Encounter (Signed)
Pt called for a refill of his  HYDROcodone-acetaminophen (NORCO) 10-325 MG tablet  Please advise

## 2017-07-19 NOTE — Telephone Encounter (Signed)
Done hardcopy to Shirron  

## 2017-07-19 NOTE — Telephone Encounter (Signed)
Pt informed Script at front desk 

## 2017-07-24 ENCOUNTER — Other Ambulatory Visit: Payer: Self-pay | Admitting: Internal Medicine

## 2017-08-01 ENCOUNTER — Ambulatory Visit (INDEPENDENT_AMBULATORY_CARE_PROVIDER_SITE_OTHER): Payer: Medicare Other | Admitting: Internal Medicine

## 2017-08-01 ENCOUNTER — Encounter: Payer: Self-pay | Admitting: Internal Medicine

## 2017-08-01 ENCOUNTER — Other Ambulatory Visit (INDEPENDENT_AMBULATORY_CARE_PROVIDER_SITE_OTHER): Payer: Medicare Other

## 2017-08-01 VITALS — BP 162/90 | HR 62 | Temp 98.0°F | Ht 70.0 in | Wt 183.0 lb

## 2017-08-01 DIAGNOSIS — E785 Hyperlipidemia, unspecified: Secondary | ICD-10-CM | POA: Diagnosis not present

## 2017-08-01 DIAGNOSIS — I10 Essential (primary) hypertension: Secondary | ICD-10-CM | POA: Diagnosis not present

## 2017-08-01 DIAGNOSIS — G8929 Other chronic pain: Secondary | ICD-10-CM | POA: Diagnosis not present

## 2017-08-01 DIAGNOSIS — E119 Type 2 diabetes mellitus without complications: Secondary | ICD-10-CM

## 2017-08-01 DIAGNOSIS — R972 Elevated prostate specific antigen [PSA]: Secondary | ICD-10-CM | POA: Diagnosis not present

## 2017-08-01 DIAGNOSIS — Z23 Encounter for immunization: Secondary | ICD-10-CM | POA: Diagnosis not present

## 2017-08-01 DIAGNOSIS — M545 Low back pain: Secondary | ICD-10-CM

## 2017-08-01 LAB — BASIC METABOLIC PANEL
BUN: 16 mg/dL (ref 6–23)
CHLORIDE: 103 meq/L (ref 96–112)
CO2: 25 mEq/L (ref 19–32)
Calcium: 9.5 mg/dL (ref 8.4–10.5)
Creatinine, Ser: 0.81 mg/dL (ref 0.40–1.50)
GFR: 99.05 mL/min (ref >60.00)
Glucose, Bld: 149 mg/dL — ABNORMAL HIGH (ref 70–99)
POTASSIUM: 4.2 meq/L (ref 3.5–5.1)
Sodium: 138 mEq/L (ref 135–145)

## 2017-08-01 LAB — CBC WITH DIFFERENTIAL/PLATELET
BASOS ABS: 0 10*3/uL (ref 0.0–0.1)
Basophils Relative: 0.7 % (ref 0.0–3.0)
EOS PCT: 2.5 % (ref 0.0–5.0)
Eosinophils Absolute: 0.1 10*3/uL (ref 0.0–0.7)
HCT: 42.6 % (ref 39.0–52.0)
HEMOGLOBIN: 14.4 g/dL (ref 13.0–17.0)
Lymphocytes Relative: 28.3 % (ref 12.0–46.0)
Lymphs Abs: 1.6 10*3/uL (ref 0.7–4.0)
MCHC: 33.8 g/dL (ref 30.0–36.0)
MCV: 90.8 fl (ref 78.0–100.0)
MONOS PCT: 11.2 % (ref 3.0–12.0)
Monocytes Absolute: 0.6 10*3/uL (ref 0.1–1.0)
Neutro Abs: 3.2 10*3/uL (ref 1.4–7.7)
Neutrophils Relative %: 57.3 % (ref 43.0–77.0)
Platelets: 259 10*3/uL (ref 150.0–400.0)
RBC: 4.7 Mil/uL (ref 4.22–5.81)
RDW: 12.9 % (ref 11.5–15.5)
WBC: 5.6 10*3/uL (ref 4.0–10.5)

## 2017-08-01 LAB — URINALYSIS, ROUTINE W REFLEX MICROSCOPIC
BILIRUBIN URINE: NEGATIVE
Hgb urine dipstick: NEGATIVE
KETONES UR: 15 — AB
LEUKOCYTES UA: NEGATIVE
NITRITE: NEGATIVE
PH: 6 (ref 5.0–8.0)
RBC / HPF: NONE SEEN (ref 0–?)
TOTAL PROTEIN, URINE-UPE24: NEGATIVE
URINE GLUCOSE: NEGATIVE
UROBILINOGEN UA: 0.2 (ref 0.0–1.0)

## 2017-08-01 LAB — HEPATIC FUNCTION PANEL
ALK PHOS: 57 U/L (ref 39–117)
ALT: 16 U/L (ref 0–53)
AST: 20 U/L (ref 0–37)
Albumin: 4.3 g/dL (ref 3.5–5.2)
BILIRUBIN DIRECT: 0.3 mg/dL (ref 0.0–0.3)
BILIRUBIN TOTAL: 1.1 mg/dL (ref 0.2–1.2)
Total Protein: 6.9 g/dL (ref 6.0–8.3)

## 2017-08-01 LAB — LIPID PANEL
CHOL/HDL RATIO: 3
CHOLESTEROL: 127 mg/dL (ref 0–200)
HDL: 50.1 mg/dL (ref >39.00)
LDL Cholesterol: 61 mg/dL (ref 0–99)
NonHDL: 76.48
Triglycerides: 77 mg/dL (ref 0.0–149.0)
VLDL: 15.4 mg/dL (ref 0.0–40.0)

## 2017-08-01 LAB — MICROALBUMIN / CREATININE URINE RATIO
CREATININE, U: 239.9 mg/dL
MICROALB/CREAT RATIO: 1.6 mg/g (ref 0.0–30.0)
Microalb, Ur: 3.9 mg/dL — ABNORMAL HIGH (ref 0.0–1.9)

## 2017-08-01 LAB — PSA: PSA: 3 ng/mL (ref 0.10–4.00)

## 2017-08-01 LAB — HEMOGLOBIN A1C: HEMOGLOBIN A1C: 6.9 % — AB (ref 4.6–6.5)

## 2017-08-01 LAB — TSH: TSH: 1.72 u[IU]/mL (ref 0.35–4.50)

## 2017-08-01 MED ORDER — HYDROCODONE-ACETAMINOPHEN 10-325 MG PO TABS: 1.0000 | ORAL_TABLET | Freq: Every evening | ORAL | 0 refills | Status: DC | PRN

## 2017-08-01 NOTE — Progress Notes (Signed)
Subjective:    Patient ID: David Bolton, male    DOB: [birth year], 76 y.o.   MRN: [id number]  HPI  Here to f/u; overall doing ok,  Pt denies chest pain, increasing sob or doe, wheezing, orthopnea, PND, increased LE swelling, palpitations, dizziness or syncope.  Pt denies new neurological symptoms such as new headache, or facial or extremity weakness or numbness.  Pt denies polydipsia, polyuria, or low sugar episode.  Pt states overall good compliance with meds, mostly trying to follow appropriate diet, with wt overall stable,  but little exercise however  BP at home 133/90 per pt, got stuck in traffic on way here and BP quite high due to this.  Denies worsening depressive symptoms, suicidal ideation, or panic; has ongoing anxiety, not increased recentl - "just the usual."  Due for optho f/u appt oct 16 - Dr Phineas Douglas.  BP Readings from Last 3 Encounters:  08/01/17 (!) 162/90  01/16/17 128/84  01/12/17 132/86  Pt continues to have recurring qhs neck pain without change in severity, bowel or bladder change, fever, wt loss,  worsening LE pain/numbness/weakness, gait change or falls.  Asks for med refills Past Medical History:  Diagnosis Date  . ALLERGIC RHINITIS 06/08/2007  . ANXIETY 06/08/2007  . Cervicalgia 12/05/2007  . COLONIC POLYPS, HX OF 06/08/2007  . Cough 08/12/2010  . DEGENERATION, CERVICAL DISC 07/30/2007  . DEPENDENCE, ALCOHOL NEC/NOS, IN REMISSION 06/08/2007  . DEPRESSION 06/08/2007  . DIVERTICULOSIS, COLON 06/08/2007  . EPIGASTRIC TENDERNESS 12/05/2007  . FATIGUE 09/10/2008  . GERD 06/08/2007  . Headache(784.0) 06/08/2007  . HYPERLIPIDEMIA 06/08/2007  . HYPERTENSION 06/08/2007  . IBS 06/08/2007  . Impaired glucose tolerance 02/10/2011  . LOW BACK PAIN, CHRONIC 08/12/2010  . Nocturia 07/16/2009  . PILONIDAL CYST 06/08/2007  . PLANTAR FASCIITIS 06/08/2007   Past Surgical History:  Procedure Laterality Date  . bladder and prostate surgury    . CERVICAL FUSION    . pilonidal cyst surgury      reports  that he has never smoked. He has never used smokeless tobacco. He reports that he does not drink alcohol or use drugs. family history includes Heart attack in his paternal aunt; Heart disease in his mother; Kidney disease in his other. Allergies  Allergen Reactions  . Doxycycline Hyclate   . Peanut-Containing Drug Products     Nose bleed, headache  . Sulfonamide Derivatives    Current Outpatient Prescriptions on File Prior to Visit  Medication Sig Dispense Refill  . amLODipine (NORVASC) 5 MG tablet TAKE 1 TABLET BY MOUTH ONCE A DAY 90 tablet 0  . aspirin EC 81 MG EC tablet Take 81 mg by mouth daily.      Marland Kitchen atorvastatin (LIPITOR) 20 MG tablet TAKE 1 TABLET BY MOUTH DAILY. 90 tablet 3  . cholecalciferol (VITAMIN D) 400 units TABS tablet Take 400 Units by mouth.    . gabapentin (NEURONTIN) 100 MG capsule Take 1-2 capsules (100-200 mg total) by mouth at bedtime. 60 capsule 3  . Glucosamine 500 MG TABS Take by mouth.      Marland Kitchen HYDROcodone-acetaminophen (NORCO) 10-325 MG tablet Take 1 tablet by mouth at bedtime as needed. 30 tablet 0  . lisinopril (PRINIVIL,ZESTRIL) 40 MG tablet TAKE 1 TABLET BY MOUTH ONCE A DAY 90 tablet 1  . metFORMIN (GLUCOPHAGE-XR) 500 MG 24 hr tablet Take 3 tablets (1,500 mg total) by mouth daily with breakfast. 270 tablet 3  . metoprolol succinate (TOPROL-XL) 50 MG 24 hr tablet TAKE 1/2 TABLET  BY MOUTH ONCE A DAY 90 tablet 2  . Multiple Vitamin (MULTIVITAMIN) capsule Take 1 capsule by mouth daily.      . OMEGA 3 340 MG CPDR Take by mouth daily.      . ranitidine (ZANTAC) 150 MG tablet Take 1 tablet (150 mg total) by mouth daily as needed for heartburn. 90 tablet 3   No current facility-administered medications on file prior to visit.    Review of Systems  Constitutional: Negative for other unusual diaphoresis or sweats HENT: Negative for ear discharge or swelling Eyes: Negative for other worsening visual disturbances Respiratory: Negative for stridor or other swelling    Gastrointestinal: Negative for worsening distension or other blood Genitourinary: Negative for retention or other urinary change Musculoskeletal: Negative for other MSK pain or swelling Skin: Negative for color change or other new lesions Neurological: Negative for worsening tremors and other numbness  Psychiatric/Behavioral: Negative for worsening agitation or other fatigue All other system neg per pt    Objective:   Physical Exam BP (!) 162/90   Pulse 62   Temp 98 F (36.7 C) (Oral)   Ht 5\' 10"  (1.778 m)   Wt 183 lb (83 kg)   SpO2 100%   BMI 26.26 kg/m  VS noted,  Constitutional: Pt appears in NAD HENT: Head: NCAT.  Right Ear: External ear normal.  Left Ear: External ear normal.  Eyes: . Pupils are equal, round, and reactive to light. Conjunctivae and EOM are normal Nose: without d/c or deformity Neck: Neck supple. Gross normal ROM Cardiovascular: Normal rate and regular rhythm.   Pulmonary/Chest: Effort normal and breath sounds without rales or wheezing.  Neurological: Pt is alert. At baseline orientation, motor grossly intact Skin: Skin is warm. No rashes, other new lesions, no LE edema Psychiatric: Pt behavior is normal without agitation  No other exam findings  Lab Results  Component Value Date   WBC 7.4 07/11/2016   HGB 14.7 07/11/2016   HCT 42.6 07/11/2016   PLT 283.0 07/11/2016   GLUCOSE 190 (H) 01/16/2017   CHOL 151 01/16/2017   TRIG 125.0 01/16/2017   HDL 56.40 01/16/2017   LDLDIRECT 139.0 02/10/2011   LDLCALC 69 01/16/2017   ALT 21 01/16/2017   AST 24 01/16/2017   NA 135 01/16/2017   K 4.2 01/16/2017   CL 102 01/16/2017   CREATININE 0.82 01/16/2017   BUN 14 01/16/2017   CO2 25 01/16/2017   TSH 1.87 07/11/2016   PSA 2.72 07/11/2016   HGBA1C 8.1 (H) 01/16/2017   MICROALBUR 2.6 (H) 07/11/2016   Lab Results  Component Value Date   PSA 2.72 07/11/2016   PSA 1.99 08/12/2014   PSA 1.94 08/06/2013      Assessment & Plan:

## 2017-08-01 NOTE — Assessment & Plan Note (Signed)
stable overall by history and exam, recent data reviewed with pt, and pt to continue medical treatment as before,  to f/u any worsening symptoms or concerns Lab Results  Component Value Date   LDLCALC 69 01/16/2017

## 2017-08-01 NOTE — Assessment & Plan Note (Signed)
With better cbgs per pt at home on increased metformin and watching the diet better, o/w stable overall by history and exam, recent data reviewed with pt, and pt to continue medical treatment as before,  to f/u any worsening symptoms or concerns

## 2017-08-01 NOTE — Assessment & Plan Note (Signed)
[facility] for med refills

## 2017-08-01 NOTE — Assessment & Plan Note (Signed)
Asympt, for f/u psa today 

## 2017-08-01 NOTE — Patient Instructions (Addendum)
You had the flu shot today  Please continue all other medications as before, and refills have been done if requested - the pain medication  Please have the pharmacy call with any other refills you may need.  Please continue your efforts at being more active, low cholesterol diet, and weight control.  Please keep your appointments with your specialists as you may have planned  Please go to the LAB in the Basement (turn left off the elevator) for the tests to be done today  You will be contacted by phone if any changes need to be made immediately.  Otherwise, you will receive a letter about your results with an explanation, but please check with MyChart first.  Please remember to sign up for MyChart if you have not done so, as this will be important to you in the future with finding out test results, communicating by private email, and scheduling acute appointments online when needed.  Please return in 6 months, or sooner if needed

## 2017-08-14 DIAGNOSIS — E119 Type 2 diabetes mellitus without complications: Secondary | ICD-10-CM | POA: Diagnosis not present

## 2017-08-14 DIAGNOSIS — H2513 Age-related nuclear cataract, bilateral: Secondary | ICD-10-CM | POA: Diagnosis not present

## 2017-08-14 DIAGNOSIS — H25013 Cortical age-related cataract, bilateral: Secondary | ICD-10-CM | POA: Diagnosis not present

## 2017-08-14 LAB — HM DIABETES EYE EXAM

## 2017-10-22 ENCOUNTER — Other Ambulatory Visit: Payer: Self-pay | Admitting: Internal Medicine

## 2017-11-14 ENCOUNTER — Telehealth: Payer: Self-pay | Admitting: Internal Medicine

## 2017-11-14 MED ORDER — HYDROCODONE-ACETAMINOPHEN 10-325 MG PO TABS: 1.0000 | ORAL_TABLET | Freq: Every evening | ORAL | 0 refills | Status: DC | PRN

## 2017-11-14 NOTE — Telephone Encounter (Signed)
Done erx 

## 2017-11-14 NOTE — Telephone Encounter (Signed)
Copied from [place] [id number]. Topic: Quick Communication - See Telephone Encounter >> Nov 14, 2017  9:47 AM Ether Griffins B wrote: CRM for notification. See Telephone encounter for:  Pt needing refill on hydrocodone  11/14/17.

## 2017-11-14 NOTE — Telephone Encounter (Signed)
Check [place] registry last filled 10/15/2017.../lmb  

## 2017-11-14 NOTE — Telephone Encounter (Signed)
No # was given or on pt chart. MD approved and sent to [facility]../lmb

## 2017-11-26 ENCOUNTER — Other Ambulatory Visit: Payer: Self-pay | Admitting: Internal Medicine

## 2017-12-17 ENCOUNTER — Telehealth: Payer: Self-pay | Admitting: Internal Medicine

## 2017-12-17 MED ORDER — HYDROCODONE-ACETAMINOPHEN 10-325 MG PO TABS: 1.0000 | ORAL_TABLET | Freq: Every evening | ORAL | 0 refills | Status: DC | PRN

## 2017-12-17 NOTE — Telephone Encounter (Signed)
Copied from [place]. Topic: Quick Communication - See Telephone Encounter >> Dec 17, 2017 12:09 PM Vernona Rieger wrote: CRM for notification. See Telephone encounter for:   12/17/17.  HYDROcodone-acetaminophen (NORCO) 10-325 MG tablet  [facility] # [place], [place] Already contacted pharmacy

## 2017-12-17 NOTE — Telephone Encounter (Signed)
Done erx 

## 2017-12-17 NOTE — Telephone Encounter (Signed)
Hydrocodone  Refill  LOV 08/01/2017  Last filled   11/14/2017  [place]

## 2017-12-18 NOTE — Telephone Encounter (Signed)
Called pt no answer LMOM rx sent to [facility].../lmb 

## 2017-12-24 NOTE — Progress Notes (Signed)
[name] [address] [place], [place] [address] Phone: [phone] Subjective:    I'm seeing this patient by the request  of:    CC:   PXT:[id number]  David Bolton is a 77 y.o. male coming in with complaint of bilateral shoulder pain. Has been seen previously for right RTC arthropathy. Injection performed on right shoulder last visit. He has been having pain for about 2 weeks after cleaning out his gutters. He has been having cervical spine pain.  Patient has been seen previously and has needed aspiration      Past Medical History:  Diagnosis Date  . ALLERGIC RHINITIS 06/08/2007  . ANXIETY 06/08/2007  . Cervicalgia 12/05/2007  . COLONIC POLYPS, HX OF 06/08/2007  . Cough 08/12/2010  . DEGENERATION, CERVICAL DISC 07/30/2007  . DEPENDENCE, ALCOHOL NEC/NOS, IN REMISSION 06/08/2007  . DEPRESSION 06/08/2007  . DIVERTICULOSIS, COLON 06/08/2007  . EPIGASTRIC TENDERNESS 12/05/2007  . FATIGUE 09/10/2008  . GERD 06/08/2007  . Headache(784.0) 06/08/2007  . HYPERLIPIDEMIA 06/08/2007  . HYPERTENSION 06/08/2007  . IBS 06/08/2007  . Impaired glucose tolerance 02/10/2011  . LOW BACK PAIN, CHRONIC 08/12/2010  . Nocturia 07/16/2009  . PILONIDAL CYST 06/08/2007  . PLANTAR FASCIITIS 06/08/2007   Past Surgical History:  Procedure Laterality Date  . bladder and prostate surgury    . CERVICAL FUSION    . pilonidal cyst surgury     Social History   Socioeconomic History  . Marital status: Divorced    Spouse name: Not on file  . Number of children: 3  . Years of education: Not on file  . Highest education level: Not on file  Social Needs  . Financial resource strain: Not on file  . Food insecurity - worry: Not on file  . Food insecurity - inability: Not on file  . Transportation needs - medical: Not on file  . Transportation needs - non-medical: Not on file  Occupational History  . Occupation: retired Engineering geologist delivery  Tobacco Use  . Smoking status:  Never Smoker  . Smokeless tobacco: Never Used  Substance and Sexual Activity  . Alcohol use: No  . Drug use: No  . Sexual activity: Not on file  Other Topics Concern  . Not on file  Social History Narrative  . Not on file   Allergies  Allergen Reactions  . Doxycycline Hyclate   . Peanut-Containing Drug Products     Nose bleed, headache  . Sulfonamide Derivatives    Family History  Problem Relation Age of Onset  . Heart disease Mother        mature heart disease  . Kidney disease Other   . Heart attack Paternal Aunt      Past medical history, social, surgical and family history all reviewed in electronic medical record.  No pertanent information unless stated regarding to the chief complaint.   Review of Systems:Review of systems updated and as accurate as of 12/24/17  No headache, visual changes, nausea, vomiting, diarrhea, constipation, dizziness, abdominal pain, skin rash, fevers, chills, night sweats, weight loss, swollen lymph nodes, body aches, joint swelling, muscle aches, chest pain, shortness of breath, mood changes.   Objective  There were no vitals taken for this visit. Systems examined below as of 12/24/17   General: No apparent distress alert and oriented x3 mood and affect normal, dressed appropriately.  HEENT: Pupils equal, extraocular movements intact  Respiratory: Patient's speak in full sentences and does not appear short of  breath  Cardiovascular: No lower extremity edema, non tender, no erythema  Skin: Warm dry intact with no signs of infection or rash on extremities or on axial skeleton.  Abdomen: Soft nontender  Neuro: Cranial nerves II through XII are intact, neurovascularly intact in all extremities with 2+ DTRs and 2+ pulses.  Lymph: No lymphadenopathy of posterior or anterior cervical chain or axillae bilaterally.  Gait normal with good balance and coordination.  MSK:  Non tender with full range of motion and good stability and symmetric strength  and tone of  elbows, wrist, hip, knee and ankles bilaterally.  Arthritic changes of multiple joints Bilateral shoulders does have some small effusions noted on the posterior aspect.  Decreased range of motion in all planes by at least 10 degrees.  Positive impingement bilaterally.  4 out of 5 strength of the rotator cuff bilaterally.  Neurovascularly intact distally. Neck exam does show some limited range of motion of the knees distally.  Negative Spurling's bilaterally.  Procedure: Real-time Ultrasound Guided Injection of right glenohumeral joint Device: GE Logiq Q7  Ultrasound guided injection is preferred based studies that show increased duration, increased effect, greater accuracy, decreased procedural pain, increased response rate with ultrasound guided versus blind injection.  Verbal informed consent obtained.  Time-out conducted.  Noted no overlying erythema, induration, or other signs of local infection.  Skin prepped in a sterile fashion.  Local anesthesia: Topical Ethyl chloride.  With sterile technique and under real time ultrasound guidance:  Joint visualized.  23g 1  inch needle inserted posterior approach. Pictures taken for needle placement. Patient did have injection of 2 cc of 1% lidocaine, 2 cc of 0.5% Marcaine, and 1.0 cc of Kenalog 40 mg/dL. Completed without difficulty  Pain immediately resolved suggesting accurate placement of the medication.  Advised to call if fevers/chills, erythema, induration, drainage, or persistent bleeding.  Images permanently stored and available for review in the ultrasound unit.  Impression: Technically successful ultrasound guided injection.  Procedure: Real-time Ultrasound Guided Injection of left glenohumeral joint Device: GE Logiq E  Ultrasound guided injection is preferred based studies that show increased duration, increased effect, greater accuracy, decreased procedural pain, increased response rate with ultrasound guided versus blind  injection.  Verbal informed consent obtained.  Time-out conducted.  Noted no overlying erythema, induration, or other signs of local infection.  Skin prepped in a sterile fashion.  Local anesthesia: Topical Ethyl chloride.  With sterile technique and under real time ultrasound guidance:  Joint visualized.  21g 2 inch needle inserted posterior approach. Pictures taken for needle placement. Patient did have injection of 2 cc of 0.5% Marcaine, and 1cc of Kenalog 40 mg/dL. Completed without difficulty  Pain immediately resolved suggesting accurate placement of the medication.  Advised to call if fevers/chills, erythema, induration, drainage, or persistent bleeding.  Images permanently stored and available for review in the ultrasound unit.  Impression: Technically successful ultrasound guided injection.   Impression and Recommendations:     This case required medical decision making of moderate complexity.      Note: This dictation was prepared with Dragon dictation along with smaller phrase technology. Any transcriptional errors that result from this process are unintentional.

## 2017-12-25 ENCOUNTER — Encounter: Payer: Self-pay | Admitting: Family Medicine

## 2017-12-25 ENCOUNTER — Ambulatory Visit: Payer: Self-pay

## 2017-12-25 ENCOUNTER — Ambulatory Visit (INDEPENDENT_AMBULATORY_CARE_PROVIDER_SITE_OTHER): Payer: Medicare Other | Admitting: Family Medicine

## 2017-12-25 VITALS — BP 130/92 | HR 62 | Ht 70.0 in | Wt 181.0 lb

## 2017-12-25 DIAGNOSIS — G8929 Other chronic pain: Secondary | ICD-10-CM

## 2017-12-25 DIAGNOSIS — M25512 Pain in left shoulder: Principal | ICD-10-CM

## 2017-12-25 DIAGNOSIS — M12811 Other specific arthropathies, not elsewhere classified, right shoulder: Secondary | ICD-10-CM | POA: Diagnosis not present

## 2017-12-25 DIAGNOSIS — M12812 Other specific arthropathies, not elsewhere classified, left shoulder: Secondary | ICD-10-CM | POA: Diagnosis not present

## 2017-12-25 DIAGNOSIS — M25511 Pain in right shoulder: Secondary | ICD-10-CM

## 2017-12-25 NOTE — Patient Instructions (Signed)
Good to see you  Ice Is your friend.  We may need to check neck if this continues  2 injections today  See me again in 10 weeks

## 2017-12-25 NOTE — Assessment & Plan Note (Signed)
Bilateral injections given today.  Tolerated the procedure well.  Discussed icing regimen and home exercises.  Discussed which activities to do which wants to avoid.  Patient to increase activity slowly.  Can repeat every 10-12 weeks if necessary.

## 2018-01-14 ENCOUNTER — Telehealth: Payer: Self-pay | Admitting: Internal Medicine

## 2018-01-14 ENCOUNTER — Other Ambulatory Visit: Payer: Medicare Other

## 2018-01-14 DIAGNOSIS — R52 Pain, unspecified: Secondary | ICD-10-CM | POA: Diagnosis not present

## 2018-01-14 DIAGNOSIS — M545 Low back pain: Secondary | ICD-10-CM | POA: Diagnosis not present

## 2018-01-14 NOTE — Telephone Encounter (Signed)
Needs UDS prior to any rx as none in the system. Can call to come in within 24 hours then can get e-rx.

## 2018-01-14 NOTE — Telephone Encounter (Signed)
Copied from [place] [id number]. Topic: Quick Communication - Rx Refill/Question >> Jan 14, 2018  9:03 AM Margot Ables wrote: Medication: pt called for refill on HYDROcodone-acetaminophen (NORCO) 10-325 MG tablet - pt has 1 pill left. Takes 1/day. Has the patient contacted their pharmacy? No - controlled Preferred Pharmacy (with phone number or street name): [facility] # [id number] [place] [address] [place] [address] Phone: [phone] Fax: [phone]

## 2018-01-14 NOTE — Telephone Encounter (Signed)
Calle dpt, LVM with details below.  Order has been placed for UDS.

## 2018-01-15 ENCOUNTER — Telehealth: Payer: Self-pay | Admitting: Internal Medicine

## 2018-01-15 MED ORDER — HYDROCODONE-ACETAMINOPHEN 10-325 MG PO TABS: 1.0000 | ORAL_TABLET | Freq: Every evening | ORAL | 0 refills | Status: DC | PRN

## 2018-01-15 NOTE — Addendum Note (Signed)
Addended by: Binnie Rail on: 01/15/2018 08:29 PM   Modules accepted: Orders

## 2018-01-15 NOTE — Telephone Encounter (Signed)
Check [place] registry last filled 12/18/2017. MD is out of the office p[s advise.Marland KitchenJohny Chess

## 2018-01-15 NOTE — Telephone Encounter (Signed)
Copied from [place]. Topic: Inquiry >> Jan 15, 2018  1:48 PM Pricilla Handler wrote: Reason for CRM: Patient called to check the status of his medication refill for HYDROcodone-acetaminophen (NORCO) 10-325 MG tablet. Patient stated that he is now out of this medication.       Thank You!!!

## 2018-01-16 LAB — PAIN MGMT, PROFILE 8 W/CONF, U
6 ACETYLMORPHINE: NEGATIVE ng/mL (ref <10)
ALCOHOL METABOLITES: NEGATIVE ng/mL (ref <500)
Amphetamines: NEGATIVE ng/mL (ref <500)
BUPRENORPHINE, URINE: NEGATIVE ng/mL (ref <5)
Benzodiazepines: NEGATIVE ng/mL (ref <100)
Cocaine Metabolite: NEGATIVE ng/mL (ref <150)
Codeine: NEGATIVE ng/mL (ref <50)
Creatinine: 89.3 mg/dL
HYDROCODONE: 92 ng/mL — AB (ref <50)
Hydromorphone: 66 ng/mL — ABNORMAL HIGH (ref <50)
MDMA: NEGATIVE ng/mL (ref <500)
MORPHINE: NEGATIVE ng/mL (ref <50)
Marijuana Metabolite: NEGATIVE ng/mL (ref <20)
Norhydrocodone: 164 ng/mL — ABNORMAL HIGH (ref <50)
OPIATES: POSITIVE ng/mL — AB (ref <100)
OXIDANT: NEGATIVE ug/mL (ref <200)
Oxycodone: NEGATIVE ng/mL (ref <100)
PH: 6.43 (ref 4.5–9.0)

## 2018-01-30 ENCOUNTER — Ambulatory Visit (INDEPENDENT_AMBULATORY_CARE_PROVIDER_SITE_OTHER): Payer: Medicare Other | Admitting: Internal Medicine

## 2018-01-30 ENCOUNTER — Other Ambulatory Visit (INDEPENDENT_AMBULATORY_CARE_PROVIDER_SITE_OTHER): Payer: Medicare Other

## 2018-01-30 ENCOUNTER — Encounter: Payer: Self-pay | Admitting: Internal Medicine

## 2018-01-30 VITALS — BP 132/84 | HR 65 | Temp 97.8°F | Ht 70.0 in | Wt 179.0 lb

## 2018-01-30 DIAGNOSIS — R972 Elevated prostate specific antigen [PSA]: Secondary | ICD-10-CM | POA: Diagnosis not present

## 2018-01-30 DIAGNOSIS — I10 Essential (primary) hypertension: Secondary | ICD-10-CM

## 2018-01-30 DIAGNOSIS — E785 Hyperlipidemia, unspecified: Secondary | ICD-10-CM

## 2018-01-30 DIAGNOSIS — E119 Type 2 diabetes mellitus without complications: Secondary | ICD-10-CM

## 2018-01-30 LAB — LIPID PANEL
CHOLESTEROL: 134 mg/dL (ref 0–200)
HDL: 53.8 mg/dL (ref >39.00)
LDL Cholesterol: 63 mg/dL (ref 0–99)
NonHDL: 79.96
TRIGLYCERIDES: 84 mg/dL (ref 0.0–149.0)
Total CHOL/HDL Ratio: 2
VLDL: 16.8 mg/dL (ref 0.0–40.0)

## 2018-01-30 LAB — PSA: PSA: 2.62 ng/mL (ref 0.10–4.00)

## 2018-01-30 LAB — HEMOGLOBIN A1C: HEMOGLOBIN A1C: 7.1 % — AB (ref 4.6–6.5)

## 2018-01-30 MED ORDER — LISINOPRIL 40 MG PO TABS: 40.0000 mg | ORAL_TABLET | Freq: Every day | ORAL | 3 refills | Status: DC

## 2018-01-30 NOTE — Assessment & Plan Note (Signed)
stable overall by history and exam, recent data reviewed with pt, and pt to continue medical treatment as before,  to f/u any worsening symptoms or concerns BP Readings from Last 3 Encounters:  01/30/18 132/84  12/25/17 (!) 130/92  08/01/17 (!) 162/90

## 2018-01-30 NOTE — Patient Instructions (Addendum)

## 2018-01-30 NOTE — Assessment & Plan Note (Signed)
stable overall by history and exam, recent data reviewed with pt, and pt to continue medical treatment as before,  to f/u any worsening symptoms or concerns Lab Results  Component Value Date   [place] 61 08/01/2017   For f/u lab today

## 2018-01-30 NOTE — Assessment & Plan Note (Signed)
Due for f/u psa, consider urology if cont's to trend

## 2018-01-30 NOTE — Addendum Note (Signed)
Addended by: Juliet Rude on: 01/30/2018 09:08 AM   Modules accepted: Orders

## 2018-01-30 NOTE — Progress Notes (Signed)
Subjective:    Patient ID: David Bolton, male    DOB: 12-30-1940, 77 y.o.   MRN: [id number]  HPI  Here to f/u; overall doing ok,  Pt denies chest pain, increasing sob or doe, wheezing, orthopnea, PND, increased LE swelling, palpitations, dizziness or syncope.  Pt denies new neurological symptoms such as new headache, or facial or extremity weakness or numbness.  Pt denies polydipsia, polyuria, or low sugar episode.  Pt states overall good compliance with meds, mostly trying to follow appropriate diet, with wt overall stable,  but little exercise however. Denies urinary symptoms such as dysuria, frequency, urgency, flank pain, hematuria or n/v, fever, chills.  Wt Readings from Last 3 Encounters:  01/30/18 179 lb (81.2 kg)  12/25/17 181 lb (82.1 kg)  08/01/17 183 lb (83 kg)   Wt Readings from Last 3 Encounters:  01/30/18 179 lb (81.2 kg)  12/25/17 181 lb (82.1 kg)  08/01/17 183 lb (83 kg)  Did have wasp sting to left forearm and took benadryl which helped but was "out of it" for 2 days.  No hx of anaphylaxis. Past Medical History:  Diagnosis Date  . ALLERGIC RHINITIS 06/08/2007  . ANXIETY 06/08/2007  . Cervicalgia 12/05/2007  . COLONIC POLYPS, HX OF 06/08/2007  . Cough 08/12/2010  . DEGENERATION, CERVICAL DISC 07/30/2007  . DEPENDENCE, ALCOHOL NEC/NOS, IN REMISSION 06/08/2007  . DEPRESSION 06/08/2007  . DIVERTICULOSIS, COLON 06/08/2007  . EPIGASTRIC TENDERNESS 12/05/2007  . FATIGUE 09/10/2008  . GERD 06/08/2007  . Headache(784.0) 06/08/2007  . HYPERLIPIDEMIA 06/08/2007  . HYPERTENSION 06/08/2007  . IBS 06/08/2007  . Impaired glucose tolerance 02/10/2011  . LOW BACK PAIN, CHRONIC 08/12/2010  . Nocturia 07/16/2009  . PILONIDAL CYST 06/08/2007  . PLANTAR FASCIITIS 06/08/2007   Past Surgical History:  Procedure Laterality Date  . bladder and prostate surgury    . CERVICAL FUSION    . pilonidal cyst surgury      reports that he has never smoked. He has never used smokeless tobacco. He reports that he does  not drink alcohol or use drugs. family history includes Heart attack in his paternal aunt; Heart disease in his mother; Kidney disease in his other. Allergies  Allergen Reactions  . Doxycycline Hyclate   . Peanut-Containing Drug Products     Nose bleed, headache  . Sulfonamide Derivatives    Current Outpatient Medications on File Prior to Visit  Medication Sig Dispense Refill  . amLODipine (NORVASC) 5 MG tablet TAKE 1 TABLET BY MOUTH ONCE A DAY 90 tablet 0  . aspirin EC 81 MG EC tablet Take 81 mg by mouth daily.      Marland Kitchen atorvastatin (LIPITOR) 20 MG tablet TAKE 1 TABLET BY MOUTH DAILY. 90 tablet 2  . cholecalciferol (VITAMIN D) 400 units TABS tablet Take 400 Units by mouth.    . gabapentin (NEURONTIN) 100 MG capsule Take 1-2 capsules (100-200 mg total) by mouth at bedtime. 60 capsule 3  . Glucosamine 500 MG TABS Take by mouth.      Marland Kitchen HYDROcodone-acetaminophen (NORCO) 10-325 MG tablet Take 1 tablet by mouth at bedtime as needed. 30 tablet 0  . metFORMIN (GLUCOPHAGE-XR) 500 MG 24 hr tablet Take 3 tablets (1,500 mg total) by mouth daily with breakfast. 270 tablet 3  . metoprolol succinate (TOPROL-XL) 50 MG 24 hr tablet TAKE 1/2 TABLET BY MOUTH ONCE A DAY 90 tablet 1  . Multiple Vitamin (MULTIVITAMIN) capsule Take 1 capsule by mouth daily.      Ernestine Conrad  3 340 MG CPDR Take by mouth daily.      . ranitidine (ZANTAC) 150 MG tablet Take 1 tablet (150 mg total) by mouth daily as needed for heartburn. 90 tablet 3   No current facility-administered medications on file prior to visit.    Review of Systems Constitutional: Negative for other unusual diaphoresis, sweats, appetite or weight changes HENT: Negative for other worsening hearing loss, ear pain, facial swelling, mouth sores or neck stiffness.   Eyes: Negative for other worsening pain, redness or other visual disturbance.  Respiratory: Negative for other stridor or swelling Cardiovascular: Negative for other palpitations or other chest pain    Gastrointestinal: Negative for worsening diarrhea or loose stools, blood in stool, distention or other pain Genitourinary: Negative for hematuria, flank pain or other change in urine volume.  Musculoskeletal: Negative for myalgias or other joint swelling.  Skin: Negative for other color change, or other wound or worsening drainage.  Neurological: Negative for other syncope or numbness. Hematological: Negative for other adenopathy or swelling Psychiatric/Behavioral: Negative for hallucinations, other worsening agitation, SI, self-injury, or new decreased concentration All other system neg per pt    Objective:   Physical Exam BP 132/84   Pulse 65   Temp 97.8 F (36.6 C) (Oral)   Ht 5\' 10"  (1.778 m)   Wt 179 lb (81.2 kg)   SpO2 99%   BMI 25.68 kg/m  VS noted,  Constitutional: Pt appears in NAD HENT: Head: NCAT.  Right Ear: External ear normal.  Left Ear: External ear normal.  Eyes: . Pupils are equal, round, and reactive to light. Conjunctivae and EOM are normal Nose: without d/c or deformity Neck: Neck supple. Gross normal ROM Cardiovascular: Normal rate and regular rhythm.   Pulmonary/Chest: Effort normal and breath sounds without rales or wheezing.  Abd:  Soft, NT, ND, + BS, no organomegaly Neurological: Pt is alert. At baseline orientation, motor grossly intact Skin: Skin is warm. No rashes, other new lesions, no LE edema Psychiatric: Pt behavior is normal without agitation  No other exam findings  Lab Results  Component Value Date   PSA 3.00 08/01/2017   PSA 2.72 07/11/2016   PSA 1.99 08/12/2014   Lab Results  Component Value Date   WBC 5.6 08/01/2017   HGB 14.4 08/01/2017   HCT 42.6 08/01/2017   PLT 259.0 08/01/2017   GLUCOSE 149 (H) 08/01/2017   CHOL 127 08/01/2017   TRIG 77.0 08/01/2017   HDL 50.10 08/01/2017   LDLDIRECT 139.0 02/10/2011   LDLCALC 61 08/01/2017   ALT 16 08/01/2017   AST 20 08/01/2017   NA 138 08/01/2017   K 4.2 08/01/2017   CL 103  08/01/2017   CREATININE 0.81 08/01/2017   BUN 16 08/01/2017   CO2 25 08/01/2017   TSH 1.72 08/01/2017   PSA 3.00 08/01/2017   HGBA1C 6.9 (H) 08/01/2017   MICROALBUR 3.9 (H) 08/01/2017       Assessment & Plan:

## 2018-01-30 NOTE — Assessment & Plan Note (Signed)
stable overall by history and exam, recent data reviewed with pt, and pt to continue medical treatment as before,  to f/u any worsening symptoms or concerns Lab Results  Component Value Date   HGBA1C 6.9 (H) 08/01/2017  for f/u lab

## 2018-02-15 ENCOUNTER — Telehealth: Payer: Self-pay | Admitting: Internal Medicine

## 2018-02-15 NOTE — Telephone Encounter (Signed)
Rx refill request: hydrocodone acetaminophen 10-325 mg   Filled: 01/15/18  #30  LOV: 01/30/18  PCP: Jenny Reichmann  Pharmacy:verified

## 2018-02-15 NOTE — Telephone Encounter (Signed)
Copied from [place] [id number]. Topic: Quick Communication - Rx Refill/Question >> Feb 15, 2018 11:02 AM Neva Seat wrote: HYDROcodone-acetaminophen ([place]) 10-325 MG tablet  Pt needing refills  [facility] # [address], [place] - [place] [address] [place] [address] Phone: [phone] Fax: [phone]

## 2018-02-18 MED ORDER — AMLODIPINE BESYLATE 5 MG PO TABS: 5.0000 mg | ORAL_TABLET | Freq: Every day | ORAL | 1 refills | Status: DC

## 2018-02-19 NOTE — Telephone Encounter (Signed)
Patient needs the Hydrocodone 10-325 MG sent in. It has not been sent in yet. Can we take care of this. Thank you.

## 2018-02-20 MED ORDER — HYDROCODONE-ACETAMINOPHEN 10-325 MG PO TABS: 1.0000 | ORAL_TABLET | Freq: Every evening | ORAL | 0 refills | Status: DC | PRN

## 2018-02-20 NOTE — Addendum Note (Signed)
Addended by: Biagio Borg on: 02/20/2018 12:29 PM   Modules accepted: Orders

## 2018-02-20 NOTE — Telephone Encounter (Signed)
Done erx 

## 2018-03-10 NOTE — Progress Notes (Signed)
[name] [place], [place] [address] Phone: [phone] Subjective:     CC: Bilateral shoulder pain follow-up  YHC:[id number]  David Bolton is a 77 y.o. male coming in with complaint of bilateral shoulder pain. He has been cleaning the porch and garage and his pain continues. He states that he takes a vicodin once daily for the pain.  Patient has known bilateral rotator cuff arthropathy bilaterally.  Also history of cervical fusion.  Noticing more neck pain.  Patient has been noncompliant with the other medications including the gabapentin.       Past Medical History:  Diagnosis Date  . ALLERGIC RHINITIS 06/08/2007  . ANXIETY 06/08/2007  . Cervicalgia 12/05/2007  . COLONIC POLYPS, HX OF 06/08/2007  . Cough 08/12/2010  . DEGENERATION, CERVICAL DISC 07/30/2007  . DEPENDENCE, ALCOHOL NEC/NOS, IN REMISSION 06/08/2007  . DEPRESSION 06/08/2007  . DIVERTICULOSIS, COLON 06/08/2007  . EPIGASTRIC TENDERNESS 12/05/2007  . FATIGUE 09/10/2008  . GERD 06/08/2007  . Headache(784.0) 06/08/2007  . HYPERLIPIDEMIA 06/08/2007  . HYPERTENSION 06/08/2007  . IBS 06/08/2007  . Impaired glucose tolerance 02/10/2011  . LOW BACK PAIN, CHRONIC 08/12/2010  . Nocturia 07/16/2009  . PILONIDAL CYST 06/08/2007  . PLANTAR FASCIITIS 06/08/2007   Past Surgical History:  Procedure Laterality Date  . bladder and prostate surgury    . CERVICAL FUSION    . pilonidal cyst surgury     Social History   Socioeconomic History  . Marital status: Divorced    Spouse name: Not on file  . Number of children: 3  . Years of education: Not on file  . Highest education level: Not on file  Occupational History  . Occupation: retired Engineering geologist delivery  Social Needs  . Financial resource strain: Not on file  . Food insecurity:    Worry: Not on file    Inability: Not on file  . Transportation needs:    Medical: Not on file    Non-medical: Not on file  Tobacco Use  .  Smoking status: Never Smoker  . Smokeless tobacco: Never Used  Substance and Sexual Activity  . Alcohol use: No  . Drug use: No  . Sexual activity: Not on file  Lifestyle  . Physical activity:    Days per week: Not on file    Minutes per session: Not on file  . Stress: Not on file  Relationships  . Social connections:    Talks on phone: Not on file    Gets together: Not on file    Attends religious service: Not on file    Active member of club or organization: Not on file    Attends meetings of clubs or organizations: Not on file    Relationship status: Not on file  Other Topics Concern  . Not on file  Social History Narrative  . Not on file   Allergies  Allergen Reactions  . Doxycycline Hyclate   . Peanut-Containing Drug Products     Nose bleed, headache  . Sulfonamide Derivatives    Family History  Problem Relation Age of Onset  . Heart disease Mother        mature heart disease  . Kidney disease Other   . Heart attack Paternal Aunt      Past medical history, social, surgical and family history all reviewed in electronic medical record.  No pertanent information unless stated regarding to the chief complaint.   Review of Systems:Review of systems  updated and as accurate as of 03/11/18  No headache, visual changes, nausea, vomiting, diarrhea, constipation, dizziness, abdominal pain, skin rash, fevers, chills, night sweats, weight loss, swollen lymph nodes, body aches, joint swelling, muscle aches, chest pain, shortness of breath, mood changes.   Objective  Blood pressure 128/82, pulse 64, height 5\' 10"  (1.778 m), weight 181 lb (82.1 kg), SpO2 98 %. Systems examined below as of 03/11/18   General: No apparent distress alert and oriented x3 mood and affect normal, dressed appropriately.  HEENT: Pupils equal, extraocular movements intact  Respiratory: Patient's speak in full sentences and does not appear short of breath  Cardiovascular: No lower extremity edema, non  tender, no erythema  Skin: Warm dry intact with no signs of infection or rash on extremities or on axial skeleton.  Abdomen: Soft nontender  Neuro: Cranial nerves II through XII are intact, neurovascularly intact in all extremities with 2+ DTRs and 2+ pulses.  Lymph: No lymphadenopathy of posterior or anterior cervical chain or axillae bilaterally.  Gait normal with good balance and coordination.  MSK:  Non tender with full range of motion and good stability and symmetric strength and tone of  elbows, wrist, hip, knee and ankles bilaterally.  Changes of multiple joints  Neck exam shows the patient does have limited range of motion in all planes by at least 5 to 10 degrees.  Crepitus noted.  No radicular symptoms with Spurling's.  Grip strength is 4+ out of 5 but symmetric.  Deep tendon reflexes intact.  Bilateral shoulder exam shows some mild atrophy of the muscle girdle of the scapular region.  Patient does have positive impingement.  Mild crepitus noted as well.  4 out of 5 strength of the rotator cuff bilaterally  Procedure: Real-time Ultrasound Guided Injection of right glenohumeral joint Device: GE Logiq Q7  Ultrasound guided injection is preferred based studies that show increased duration, increased effect, greater accuracy, decreased procedural pain, increased response rate with ultrasound guided versus blind injection.  Verbal informed consent obtained.  Time-out conducted.  Noted no overlying erythema, induration, or other signs of local infection.  Skin prepped in a sterile fashion.  Local anesthesia: Topical Ethyl chloride.  With sterile technique and under real time ultrasound guidance:  Joint visualized.  23g 1  inch needle inserted posterior approach. Pictures taken for needle placement. Patient did have injection of 2 cc of 1% lidocaine, 2 cc of 0.5% Marcaine, and 1.0 cc of Kenalog 40 mg/dL. Completed without difficulty  Pain immediately resolved suggesting accurate placement  of the medication.  Advised to call if fevers/chills, erythema, induration, drainage, or persistent bleeding.  Images permanently stored and available for review in the ultrasound unit.  Impression: Technically successful ultrasound guided injection.  Procedure: Real-time Ultrasound Guided Injection of left glenohumeral joint Device: GE Logiq E  Ultrasound guided injection is preferred based studies that show increased duration, increased effect, greater accuracy, decreased procedural pain, increased response rate with ultrasound guided versus blind injection.  Verbal informed consent obtained.  Time-out conducted.  Noted no overlying erythema, induration, or other signs of local infection.  Skin prepped in a sterile fashion.  Local anesthesia: Topical Ethyl chloride.  With sterile technique and under real time ultrasound guidance:  Joint visualized.  21g 2 inch needle inserted posterior approach. Pictures taken for needle placement. Patient did have injection of 2 cc of 0.5% Marcaine, and 1cc of Kenalog 40 mg/dL. Completed without difficulty  Pain immediately resolved suggesting accurate placement of the medication.  Advised  to call if fevers/chills, erythema, induration, drainage, or persistent bleeding.  Images permanently stored and available for review in the ultrasound unit.  Impression: Technically successful ultrasound guided injection.   Impression and Recommendations:     This case required medical decision making of moderate complexity.      Note: This dictation was prepared with Dragon dictation along with smaller phrase technology. Any transcriptional errors that result from this process are unintentional.

## 2018-03-11 ENCOUNTER — Ambulatory Visit (INDEPENDENT_AMBULATORY_CARE_PROVIDER_SITE_OTHER): Payer: Medicare Other | Admitting: Family Medicine

## 2018-03-11 ENCOUNTER — Encounter: Payer: Self-pay | Admitting: Family Medicine

## 2018-03-11 ENCOUNTER — Ambulatory Visit: Payer: Self-pay

## 2018-03-11 ENCOUNTER — Ambulatory Visit (INDEPENDENT_AMBULATORY_CARE_PROVIDER_SITE_OTHER)
Admission: RE | Admit: 2018-03-11 | Discharge: 2018-03-11 | Disposition: A | Discharge status: Home / Self Care | Payer: Medicare Other | Source: Ambulatory Visit | Attending: Family Medicine | Admitting: Family Medicine

## 2018-03-11 VITALS — BP 128/82 | HR 64 | Ht 70.0 in | Wt 181.0 lb

## 2018-03-11 DIAGNOSIS — Z9114 Patient's other noncompliance with medication regimen: Secondary | ICD-10-CM | POA: Diagnosis not present

## 2018-03-11 DIAGNOSIS — G8929 Other chronic pain: Secondary | ICD-10-CM

## 2018-03-11 DIAGNOSIS — M25512 Pain in left shoulder: Secondary | ICD-10-CM

## 2018-03-11 DIAGNOSIS — M12811 Other specific arthropathies, not elsewhere classified, right shoulder: Secondary | ICD-10-CM | POA: Diagnosis not present

## 2018-03-11 DIAGNOSIS — M542 Cervicalgia: Secondary | ICD-10-CM | POA: Diagnosis not present

## 2018-03-11 DIAGNOSIS — M25511 Pain in right shoulder: Secondary | ICD-10-CM | POA: Diagnosis not present

## 2018-03-11 DIAGNOSIS — M12812 Other specific arthropathies, not elsewhere classified, left shoulder: Secondary | ICD-10-CM | POA: Diagnosis not present

## 2018-03-11 NOTE — Assessment & Plan Note (Signed)
Bilateral injections given today.  Tolerated procedure well.  Discussed icing regimen and home exercises.  Topical anti-inflammatories given.  X-rays of patient's neck to see how much could be secondary to some cervical radiculopathy.  Encourage patient to take gabapentin on a more regular basis.  Follow-up again in 10 weeks

## 2018-03-11 NOTE — Patient Instructions (Signed)
Good to see yu  Injected both shoulder again today  Go down and lets get new xray of your neck  Try gabapentin 100-200mg  up to 3 times a day if needed to help with pain and more of the neck pain  See me again in 10 weeks otherwise!

## 2018-03-20 ENCOUNTER — Other Ambulatory Visit: Payer: Self-pay | Admitting: Internal Medicine

## 2018-03-28 ENCOUNTER — Telehealth: Payer: Self-pay | Admitting: Internal Medicine

## 2018-03-28 MED ORDER — HYDROCODONE-ACETAMINOPHEN 10-325 MG PO TABS: 1.0000 | ORAL_TABLET | Freq: Every evening | ORAL | 0 refills | Status: DC | PRN

## 2018-03-28 NOTE — Telephone Encounter (Signed)
Copied from [place] [id number]. Topic: Quick Communication - Rx Refill/Question >> Mar 20, 2018  9:24 AM Percell Belt A wrote: Medication: HYDROcodone-acetaminophen (NORCO) 10-325 MG tablet [[id number]]  Pt requested the 03/20/18 and he has been out for a week now and has not been sleeping good.    Has the patient contacted their pharmacy? No  (Agent: If no, request that the patient contact the pharmacy for the refill.) (Agent: If yes, when and what did the pharmacy advise?)  Preferred Pharmacy (with phone number or street name): [facility]  [phone]  Agent: Please be advised that RX refills may take up to 3 business days. We ask that you follow-up with your pharmacy.

## 2018-03-28 NOTE — Telephone Encounter (Signed)
Done erx 

## 2018-04-24 ENCOUNTER — Other Ambulatory Visit: Payer: Self-pay | Admitting: Internal Medicine

## 2018-04-24 ENCOUNTER — Telehealth: Payer: Self-pay | Admitting: Internal Medicine

## 2018-04-24 NOTE — Telephone Encounter (Signed)
Copied from [place] [id number]. Topic: Quick Communication - Rx Refill/Question >> Apr 24, 2018  8:42 AM Mylinda Latina, NT wrote: Medication: HYDROcodone-acetaminophen ([place]) 10-325 MG tablet  Has the patient contacted their pharmacy? No. Stated no one told him to call pharmacy  (Agent: If no, request that the patient contact the pharmacy for the refill.) (Agent: If yes, when and what did the pharmacy advise?)  Preferred Pharmacy (with phone number or street name): [facility] # [address] [place] [id number] (Phone) [phone] (Fax)      Agent: Please be advised that RX refills may take up to 3 business days. We ask that you follow-up with your pharmacy.

## 2018-04-24 NOTE — Telephone Encounter (Signed)
Done erx 

## 2018-04-24 NOTE — Telephone Encounter (Signed)
Medication ordered on 04/24/18 to be filled on 04/27/18.

## 2018-05-20 ENCOUNTER — Ambulatory Visit: Payer: Medicare Other | Admitting: Family Medicine

## 2018-05-25 ENCOUNTER — Other Ambulatory Visit: Payer: Self-pay | Admitting: Internal Medicine

## 2018-05-27 NOTE — Telephone Encounter (Signed)
Done erx 

## 2018-05-27 NOTE — Telephone Encounter (Signed)
04/27/2018  30# 

## 2018-06-24 ENCOUNTER — Other Ambulatory Visit: Payer: Self-pay | Admitting: Internal Medicine

## 2018-06-24 NOTE — Telephone Encounter (Signed)
Done erx 

## 2018-07-29 ENCOUNTER — Other Ambulatory Visit: Payer: Self-pay | Admitting: Internal Medicine

## 2018-07-29 NOTE — Telephone Encounter (Signed)
Done erx 

## 2018-08-01 ENCOUNTER — Ambulatory Visit: Payer: Medicare Other | Admitting: Internal Medicine

## 2018-08-08 ENCOUNTER — Encounter: Payer: Self-pay | Admitting: Internal Medicine

## 2018-08-08 ENCOUNTER — Ambulatory Visit (INDEPENDENT_AMBULATORY_CARE_PROVIDER_SITE_OTHER): Payer: Medicare Other | Admitting: Internal Medicine

## 2018-08-08 ENCOUNTER — Other Ambulatory Visit (INDEPENDENT_AMBULATORY_CARE_PROVIDER_SITE_OTHER): Payer: Medicare Other

## 2018-08-08 VITALS — BP 124/82 | HR 60 | Temp 97.8°F | Ht 70.0 in | Wt 181.0 lb

## 2018-08-08 DIAGNOSIS — N320 Bladder-neck obstruction: Secondary | ICD-10-CM | POA: Diagnosis not present

## 2018-08-08 DIAGNOSIS — E119 Type 2 diabetes mellitus without complications: Secondary | ICD-10-CM

## 2018-08-08 DIAGNOSIS — E785 Hyperlipidemia, unspecified: Secondary | ICD-10-CM

## 2018-08-08 DIAGNOSIS — I10 Essential (primary) hypertension: Secondary | ICD-10-CM

## 2018-08-08 DIAGNOSIS — Z23 Encounter for immunization: Secondary | ICD-10-CM

## 2018-08-08 LAB — LIPID PANEL
CHOL/HDL RATIO: 3
Cholesterol: 145 mg/dL (ref 0–200)
HDL: 55.4 mg/dL (ref >39.00)
LDL Cholesterol: 63 mg/dL (ref 0–99)
NONHDL: 89.67
Triglycerides: 134 mg/dL (ref 0.0–149.0)
VLDL: 26.8 mg/dL (ref 0.0–40.0)

## 2018-08-08 LAB — HEPATIC FUNCTION PANEL
ALBUMIN: 4.5 g/dL (ref 3.5–5.2)
ALK PHOS: 60 U/L (ref 39–117)
ALT: 18 U/L (ref 0–53)
AST: 25 U/L (ref 0–37)
BILIRUBIN DIRECT: 0.2 mg/dL (ref 0.0–0.3)
BILIRUBIN TOTAL: 1 mg/dL (ref 0.2–1.2)
Total Protein: 7.5 g/dL (ref 6.0–8.3)

## 2018-08-08 LAB — TSH: TSH: 2.47 u[IU]/mL (ref 0.35–4.50)

## 2018-08-08 LAB — CBC WITH DIFFERENTIAL/PLATELET
BASOS ABS: 0 10*3/uL (ref 0.0–0.1)
Basophils Relative: 0.6 % (ref 0.0–3.0)
EOS PCT: 3.3 % (ref 0.0–5.0)
Eosinophils Absolute: 0.2 10*3/uL (ref 0.0–0.7)
HCT: 43.6 % (ref 39.0–52.0)
Hemoglobin: 14.7 g/dL (ref 13.0–17.0)
LYMPHS ABS: 1.6 10*3/uL (ref 0.7–4.0)
Lymphocytes Relative: 25.3 % (ref 12.0–46.0)
MCHC: 33.8 g/dL (ref 30.0–36.0)
MCV: 90 fl (ref 78.0–100.0)
MONOS PCT: 10.3 % (ref 3.0–12.0)
Monocytes Absolute: 0.6 10*3/uL (ref 0.1–1.0)
NEUTROS PCT: 60.5 % (ref 43.0–77.0)
Neutro Abs: 3.7 10*3/uL (ref 1.4–7.7)
Platelets: 249 10*3/uL (ref 150.0–400.0)
RBC: 4.84 Mil/uL (ref 4.22–5.81)
RDW: 12.7 % (ref 11.5–15.5)
WBC: 6.2 10*3/uL (ref 4.0–10.5)

## 2018-08-08 LAB — HEMOGLOBIN A1C: HEMOGLOBIN A1C: 6.8 % — AB (ref 4.6–6.5)

## 2018-08-08 LAB — URINALYSIS, ROUTINE W REFLEX MICROSCOPIC
Bilirubin Urine: NEGATIVE
Hgb urine dipstick: NEGATIVE
Ketones, ur: NEGATIVE
Leukocytes, UA: NEGATIVE
Nitrite: NEGATIVE
PH: 5.5 (ref 5.0–8.0)
RBC / HPF: NONE SEEN (ref 0–?)
SPECIFIC GRAVITY, URINE: 1.025 (ref 1.000–1.030)
Total Protein, Urine: NEGATIVE
URINE GLUCOSE: NEGATIVE
Urobilinogen, UA: 0.2 (ref 0.0–1.0)

## 2018-08-08 LAB — BASIC METABOLIC PANEL
BUN: 12 mg/dL (ref 6–23)
CO2: 27 meq/L (ref 19–32)
CREATININE: 0.87 mg/dL (ref 0.40–1.50)
Calcium: 9.7 mg/dL (ref 8.4–10.5)
Chloride: 102 mEq/L (ref 96–112)
GFR: 90.96 mL/min (ref >60.00)
GLUCOSE: 140 mg/dL — AB (ref 70–99)
Potassium: 4.3 mEq/L (ref 3.5–5.1)
Sodium: 138 mEq/L (ref 135–145)

## 2018-08-08 LAB — PSA: PSA: 2.57 ng/mL (ref 0.10–4.00)

## 2018-08-08 LAB — MICROALBUMIN / CREATININE URINE RATIO
Creatinine,U: 150.7 mg/dL
Microalb Creat Ratio: 2.7 mg/g (ref 0.0–30.0)
Microalb, Ur: 4.1 mg/dL — ABNORMAL HIGH (ref 0.0–1.9)

## 2018-08-08 MED ORDER — HYDROCODONE-ACETAMINOPHEN 10-325 MG PO TABS: 1.0000 | ORAL_TABLET | Freq: Every evening | ORAL | 0 refills | Status: DC | PRN

## 2018-08-08 NOTE — Assessment & Plan Note (Signed)
stable overall by history and exam, recent data reviewed with pt, and pt to continue medical treatment as before,  to f/u any worsening symptoms or concerns  

## 2018-08-08 NOTE — Patient Instructions (Addendum)
You had the flu shot today  Please continue all other medications as before, and refills have been done if requested.  You are given the next months dated prescription for the pain medication sent to today  Please have the pharmacy call with any other refills you may need.  Call if you would want to try the flomax for urination  Please continue your efforts at being more active, low cholesterol diet, and weight control.  You are otherwise up to date with prevention measures today.  Please keep your appointments with your specialists as you may have planned  Please go to the LAB in the Basement (turn left off the elevator) for the tests to be done today  You will be contacted by phone if any changes need to be made immediately.  Otherwise, you will receive a letter about your results with an explanation, but please check with MyChart first.  Please remember to sign up for MyChart if you have not done so, as this will be important to you in the future with finding out test results, communicating by private email, and scheduling acute appointments online when needed.  Please return in 6 months, or sooner if needed

## 2018-08-08 NOTE — Progress Notes (Signed)
Subjective:    Patient ID: David Bolton, male    DOB: 11/30/1940, 77 y.o.   MRN: [id number]  HPI  Here for yearly f/u;  Overall doing ok;  Pt denies Chest pain, worsening SOB, DOE, wheezing, orthopnea, PND, worsening LE edema, palpitations, dizziness or syncope.  Pt denies neurological change such as new headache, facial or extremity weakness.  Pt denies polydipsia, polyuria, or low sugar symptoms. Pt states overall good compliance with treatment and medications, good tolerability, and has been trying to follow appropriate diet.  Pt denies worsening depressive symptoms, suicidal ideation or panic. No fever, night sweats, wt loss, loss of appetite, or other constitutional symptoms.  Pt states good ability with ADL's, has low fall risk, home safety reviewed and adequate, no other significant changes in hearing or vision, and occasionally active with exercise. Denies urinary symptoms such as dysuria, frequency, urgency, flank pain, hematuria or n/v, fever, chills, except for nocturia with some burning sensation only at night, and has to go twice in succession to get it all out.  No daytime issues  Taking the saw palmetto, no plans to quit for now.   Due for flu shot  Pt continues to have recurring LBP without change in severity, bowel or bladder change, fever, wt loss,  worsening LE pain/numbness/weakness, gait change or falls, but having some trouble with gettting refills as someone from this office keeps telling him to call the pharmacy first.   Wt Readings from Last 3 Encounters:  08/08/18 181 lb (82.1 kg)  03/11/18 181 lb (82.1 kg)  01/30/18 179 lb (81.2 kg)   BP Readings from Last 3 Encounters:  08/08/18 124/82  03/11/18 128/82  01/30/18 132/84   Past Medical History:  Diagnosis Date  . ALLERGIC RHINITIS 06/08/2007  . ANXIETY 06/08/2007  . Cervicalgia 12/05/2007  . COLONIC POLYPS, HX OF 06/08/2007  . Cough 08/12/2010  . DEGENERATION, CERVICAL DISC 07/30/2007  . DEPENDENCE, ALCOHOL NEC/NOS, IN  REMISSION 06/08/2007  . DEPRESSION 06/08/2007  . DIVERTICULOSIS, COLON 06/08/2007  . EPIGASTRIC TENDERNESS 12/05/2007  . FATIGUE 09/10/2008  . GERD 06/08/2007  . Headache(784.0) 06/08/2007  . HYPERLIPIDEMIA 06/08/2007  . HYPERTENSION 06/08/2007  . IBS 06/08/2007  . Impaired glucose tolerance 02/10/2011  . LOW BACK PAIN, CHRONIC 08/12/2010  . Nocturia 07/16/2009  . PILONIDAL CYST 06/08/2007  . PLANTAR FASCIITIS 06/08/2007   Past Surgical History:  Procedure Laterality Date  . bladder and prostate surgury    . CERVICAL FUSION    . pilonidal cyst surgury      reports that he has never smoked. He has never used smokeless tobacco. He reports that he does not drink alcohol or use drugs. family history includes Heart attack in his paternal aunt; Heart disease in his mother; Kidney disease in his other. Allergies  Allergen Reactions  . Doxycycline Hyclate   . Peanut-Containing Drug Products     Nose bleed, headache  . Sulfonamide Derivatives    Current Outpatient Medications on File Prior to Visit  Medication Sig Dispense Refill  . amLODipine (NORVASC) 5 MG tablet Take 1 tablet (5 mg total) by mouth daily. 90 tablet 1  . aspirin EC 81 MG EC tablet Take 81 mg by mouth daily.      Marland Kitchen atorvastatin (LIPITOR) 20 MG tablet TAKE 1 TABLET BY MOUTH DAILY. 90 tablet 2  . cholecalciferol (VITAMIN D) 400 units TABS tablet Take 400 Units by mouth.    . gabapentin (NEURONTIN) 100 MG capsule Take 1-2 capsules (100-200 mg  total) by mouth at bedtime. 60 capsule 3  . Glucosamine 500 MG TABS Take by mouth.      Marland Kitchen HYDROcodone-acetaminophen (NORCO) 10-325 MG tablet TAKE ONE TABLET BY MOUTH AT BEDTIME AS NEEDED 30 tablet 0  . lisinopril (PRINIVIL,ZESTRIL) 40 MG tablet Take 1 tablet (40 mg total) by mouth daily. 90 tablet 3  . metFORMIN (GLUCOPHAGE-XR) 500 MG 24 hr tablet take 3 tablets by mouth daily with breakfast 270 tablet 1  . metoprolol succinate (TOPROL-XL) 50 MG 24 hr tablet TAKE 1/2 TABLET BY MOUTH ONCE A DAY 90 tablet 1   . Multiple Vitamin (MULTIVITAMIN) capsule Take 1 capsule by mouth daily.      . OMEGA 3 340 MG CPDR Take by mouth daily.      . ranitidine (ZANTAC) 150 MG tablet Take 1 tablet (150 mg total) by mouth daily as needed for heartburn. 90 tablet 3   No current facility-administered medications on file prior to visit.    Review of Systems  Constitutional: Negative for other unusual diaphoresis or sweats HENT: Negative for ear discharge or swelling Eyes: Negative for other worsening visual disturbances Respiratory: Negative for stridor or other swelling  Gastrointestinal: Negative for worsening distension or other blood Genitourinary: Negative for retention or other urinary change Musculoskeletal: Negative for other MSK pain or swelling Skin: Negative for color change or other new lesions Neurological: Negative for worsening tremors and other numbness  Psychiatric/Behavioral: Negative for worsening agitation or other fatigue All other system neg per pt    Objective:   Physical Exam BP 124/82   Pulse 60   Temp 97.8 F (36.6 C) (Oral)   Ht 5\' 10"  (1.778 m)   Wt 181 lb (82.1 kg)   SpO2 98%   BMI 25.97 kg/m   VS noted,  Constitutional: Pt appears in NAD HENT: Head: NCAT.  Right Ear: External ear normal.  Left Ear: External ear normal.  Eyes: . Pupils are equal, round, and reactive to light. Conjunctivae and EOM are normal Nose: without d/c or deformity Neck: Neck supple. Gross normal ROM Cardiovascular: Normal rate and regular rhythm.   Pulmonary/Chest: Effort normal and breath sounds without rales or wheezing.  Abd:  Soft, NT, ND, + BS, no organomegaly Neurological: Pt is alert. At baseline orientation, motor grossly intact Skin: Skin is warm. No rashes, other new lesions, no LE edema Psychiatric: Pt behavior is normal without agitation  No other exam findings Lab Results  Component Value Date   WBC 5.6 08/01/2017   HGB 14.4 08/01/2017   HCT 42.6 08/01/2017   PLT 259.0  08/01/2017   GLUCOSE 149 (H) 08/01/2017   CHOL 134 01/30/2018   TRIG 84.0 01/30/2018   HDL 53.80 01/30/2018   LDLDIRECT 139.0 02/10/2011   LDLCALC 63 01/30/2018   ALT 16 08/01/2017   AST 20 08/01/2017   NA 138 08/01/2017   K 4.2 08/01/2017   CL 103 08/01/2017   CREATININE 0.81 08/01/2017   BUN 16 08/01/2017   CO2 25 08/01/2017   TSH 1.72 08/01/2017   PSA 2.62 01/30/2018   HGBA1C 7.1 (H) 01/30/2018   MICROALBUR 3.9 (H) 08/01/2017      Assessment & Plan:

## 2018-09-27 ENCOUNTER — Other Ambulatory Visit: Payer: Self-pay | Admitting: Internal Medicine

## 2018-09-30 NOTE — Telephone Encounter (Signed)
Done erx 

## 2018-09-30 NOTE — Telephone Encounter (Signed)
   LOV:08/08/18 NextOV:02/11/19 Last Filled/Quantity:08/27/18 30#

## 2018-10-28 ENCOUNTER — Other Ambulatory Visit: Payer: Self-pay | Admitting: Internal Medicine

## 2018-10-28 NOTE — Telephone Encounter (Signed)
   LOV:08/08/18 NextOV:02/12/19 Last Filled/Quantity:09/30/18 30#

## 2018-10-30 DIAGNOSIS — E119 Type 2 diabetes mellitus without complications: Secondary | ICD-10-CM | POA: Diagnosis not present

## 2018-10-30 DIAGNOSIS — K219 Gastro-esophageal reflux disease without esophagitis: Secondary | ICD-10-CM | POA: Diagnosis not present

## 2018-10-30 DIAGNOSIS — E785 Hyperlipidemia, unspecified: Secondary | ICD-10-CM | POA: Diagnosis not present

## 2018-10-30 DIAGNOSIS — Z86010 Personal history of colonic polyps: Secondary | ICD-10-CM | POA: Diagnosis not present

## 2018-10-30 DIAGNOSIS — F1011 Alcohol abuse, in remission: Secondary | ICD-10-CM | POA: Diagnosis not present

## 2018-10-30 DIAGNOSIS — K921 Melena: Secondary | ICD-10-CM | POA: Diagnosis not present

## 2018-10-30 DIAGNOSIS — K640 First degree hemorrhoids: Secondary | ICD-10-CM | POA: Diagnosis not present

## 2018-10-30 DIAGNOSIS — I10 Essential (primary) hypertension: Secondary | ICD-10-CM | POA: Diagnosis not present

## 2018-10-30 DIAGNOSIS — K625 Hemorrhage of anus and rectum: Secondary | ICD-10-CM | POA: Diagnosis not present

## 2018-10-31 ENCOUNTER — Telehealth: Payer: Self-pay

## 2018-10-31 DIAGNOSIS — K649 Unspecified hemorrhoids: Secondary | ICD-10-CM

## 2018-10-31 DIAGNOSIS — K921 Melena: Secondary | ICD-10-CM

## 2018-10-31 NOTE — Telephone Encounter (Signed)
Ok this is done 

## 2018-10-31 NOTE — Addendum Note (Signed)
Addended by: Biagio Borg on: 10/31/2018 12:48 PM   Modules accepted: Orders

## 2018-10-31 NOTE — Telephone Encounter (Signed)
Copied from [place] [id number]. Topic: General - Other >> Oct 31, 2018  9:54 AM Marin Olp L wrote: Reason for CRM: Patient needs a colonoscopy. Was seen at [facility] ED for rectal bleeding as was told he had an internal hemorrhoid and was advised to get one asap.Please call patient to discuss plan.

## 2018-11-06 ENCOUNTER — Encounter: Payer: Self-pay | Admitting: Gastroenterology

## 2018-11-06 ENCOUNTER — Other Ambulatory Visit: Payer: Self-pay | Admitting: Internal Medicine

## 2018-11-06 MED ORDER — HYDROCODONE-ACETAMINOPHEN 10-325 MG PO TABS: 1.0000 | ORAL_TABLET | Freq: Every evening | ORAL | 0 refills | Status: DC | PRN

## 2018-11-06 NOTE — Telephone Encounter (Signed)
Patient came by the office checking on this request. Please call patient with status.

## 2018-11-06 NOTE — Telephone Encounter (Signed)
Done erx 

## 2018-11-06 NOTE — Addendum Note (Signed)
Addended by: Biagio Borg on: 11/06/2018 05:22 PM   Modules accepted: Orders

## 2018-11-07 ENCOUNTER — Telehealth: Payer: Self-pay | Admitting: *Deleted

## 2018-11-07 NOTE — Telephone Encounter (Signed)
Dr Havery Moros, This pt is scheduled for a PV Monday 1-13 with a colon 1-20. Last colon 10-31-2008 normal, tics and hems , no polyps- 2000 pt had HPP. He was seen in the ED 10-30-2018 for rectal bleeding- he is being treated for diverticulitis- he is on Cipro and FLagyl-  Does he need to wait on his colon for several more weeks or is it ok to proceed. Please advise    thanks for your time,Marie

## 2018-11-07 NOTE — Telephone Encounter (Signed)
Pt states they did not do CT scan or Xray in the ED - they did labs- gave rectal exam , and then antibiotics based on what they thought -   Last colon 2012 per pt-  ED told pt needed to have colon- pt states no rectal bleeding now-  He has completed antibiotics and he feels better- stools are very soft  And darker than usual- still has lower abd pain now, not with bowel movements   -- he went to ED for rectal bleeding and abdominal pain   Wait or proceed as scheduled ?

## 2018-11-07 NOTE — Telephone Encounter (Signed)
If they think he had diverticulitis based on his history and treated him for it, would favor delaying the exam by another 6 weeks or so, as if he had this, risks of colonoscopy are higher until it has had time to heal. He had diverticulosis noted throughout his entire colon on the last exam. Thanks

## 2018-11-07 NOTE — Telephone Encounter (Signed)
If he is being treated for diverticulitis, best to wait 6-8 weeks from that diagnosis prior to proceeding, and in that case would reschedule. That being said I do not see any imaging on file for him, not sure if this was confirmed or just suspected. Thanks

## 2018-11-07 NOTE — Telephone Encounter (Signed)
Informed pt we need to wait 6-8 weeks- RS colon to 2-27 thursdat at 9 am and PV 2-13 at 1 pm-  Lelan Pons pV

## 2018-11-18 ENCOUNTER — Encounter: Payer: Medicare Other | Admitting: Gastroenterology

## 2018-12-06 ENCOUNTER — Other Ambulatory Visit: Payer: Self-pay | Admitting: Internal Medicine

## 2018-12-09 ENCOUNTER — Other Ambulatory Visit: Payer: Self-pay | Admitting: Internal Medicine

## 2018-12-09 NOTE — Telephone Encounter (Signed)
Done erx 

## 2018-12-12 ENCOUNTER — Ambulatory Visit (AMBULATORY_SURGERY_CENTER): Payer: Self-pay | Admitting: *Deleted

## 2018-12-12 VITALS — Ht 70.0 in | Wt 184.0 lb

## 2018-12-12 DIAGNOSIS — Z1211 Encounter for screening for malignant neoplasm of colon: Secondary | ICD-10-CM

## 2018-12-12 MED ORDER — PEG 3350-KCL-NA BICARB-NACL 420 G PO SOLR: 4000.0000 mL | Freq: Once | ORAL | 0 refills | Status: AC

## 2018-12-12 NOTE — Progress Notes (Signed)
Patient denies any allergies to eggs or soy. Patient denies any problems with anesthesia/sedation. Patient denies any oxygen use at home. Patient denies taking any diet/weight loss medications or blood thinners. EMMI education offered, pt declined.  

## 2018-12-26 ENCOUNTER — Encounter: Payer: Medicare Other | Admitting: Gastroenterology

## 2018-12-26 ENCOUNTER — Encounter: Payer: Self-pay | Admitting: Gastroenterology

## 2018-12-26 ENCOUNTER — Ambulatory Visit (AMBULATORY_SURGERY_CENTER): Payer: Medicare Other | Admitting: Gastroenterology

## 2018-12-26 VITALS — BP 133/87 | HR 50 | Temp 97.8°F | Resp 10 | Ht 70.0 in | Wt 184.0 lb

## 2018-12-26 DIAGNOSIS — Z86010 Personal history of colonic polyps: Secondary | ICD-10-CM | POA: Diagnosis not present

## 2018-12-26 DIAGNOSIS — D127 Benign neoplasm of rectosigmoid junction: Secondary | ICD-10-CM

## 2018-12-26 DIAGNOSIS — E119 Type 2 diabetes mellitus without complications: Secondary | ICD-10-CM | POA: Diagnosis not present

## 2018-12-26 DIAGNOSIS — D125 Benign neoplasm of sigmoid colon: Secondary | ICD-10-CM | POA: Diagnosis not present

## 2018-12-26 DIAGNOSIS — Z1211 Encounter for screening for malignant neoplasm of colon: Secondary | ICD-10-CM | POA: Diagnosis not present

## 2018-12-26 DIAGNOSIS — I10 Essential (primary) hypertension: Secondary | ICD-10-CM | POA: Diagnosis not present

## 2018-12-26 DIAGNOSIS — D122 Benign neoplasm of ascending colon: Secondary | ICD-10-CM | POA: Diagnosis not present

## 2018-12-26 DIAGNOSIS — D128 Benign neoplasm of rectum: Secondary | ICD-10-CM | POA: Diagnosis not present

## 2018-12-26 MED ORDER — SODIUM CHLORIDE 0.9 % IV SOLN: 500.0000 mL | Freq: Once | INTRAVENOUS | Status: DC

## 2018-12-26 NOTE — Progress Notes (Signed)
Report given to PACU, vss 

## 2018-12-26 NOTE — Progress Notes (Signed)
Called to room to assist during endoscopic procedure.  Patient ID and intended procedure confirmed with present staff. Received instructions for my participation in the procedure from the performing physician.  

## 2018-12-26 NOTE — Op Note (Signed)
[place] Patient Name: David Bolton Procedure Date: 12/26/2018 9:11 AM MRN: [id number] Endoscopist: Remo Lipps P. Havery Moros , MD Age: 78 Referring MD:  Date of Birth: [birth year] Gender: Male Account #: [id number] Procedure:                Colonoscopy Indications:              Screening for colorectal malignant neoplasm Medicines:                Monitored Anesthesia Care Procedure:                Pre-Anesthesia Assessment:                           - Prior to the procedure, a History and Physical                            was performed, and patient medications and                            allergies were reviewed. The patient's tolerance of                            previous anesthesia was also reviewed. The risks                            and benefits of the procedure and the sedation                            options and risks were discussed with the patient.                            All questions were answered, and informed consent                            was obtained. Prior Anticoagulants: The patient has                            taken no previous anticoagulant or antiplatelet                            agents. ASA Grade Assessment: II - A patient with                            mild systemic disease. After reviewing the risks                            and benefits, the patient was deemed in                            satisfactory condition to undergo the procedure.                           After obtaining informed consent, the colonoscope  was passed under direct vision. Throughout the                            procedure, the patient's blood pressure, pulse, and                            oxygen saturations were monitored continuously. The                            Colonoscope was introduced through the anus and                            advanced to the the cecum, identified by                            appendiceal orifice  and ileocecal valve. The                            colonoscopy was performed without difficulty. The                            patient tolerated the procedure well. The quality                            of the bowel preparation was adequate. The                            ileocecal valve, appendiceal orifice, and rectum                            were photographed. Scope In: 9:20:22 AM Scope Out: 9:39:16 AM Scope Withdrawal Time: 0 hours 16 minutes 19 seconds  Total Procedure Duration: 0 hours 18 minutes 54 seconds  Findings:                 The perianal and digital rectal examinations were                            normal.                           A 3 mm polyp was found in the ascending colon. The                            polyp was sessile. The polyp was removed with a                            cold snare. Resection and retrieval were complete.                           A 3 mm polyp was found in the sigmoid colon. The                            polyp was sessile. The polyp was removed with a  cold snare. Resection and retrieval were complete.                           A 5 mm polyp was found in the recto-sigmoid colon.                            The polyp was sessile. The polyp was removed with a                            cold snare. Resection and retrieval were complete.                           A 3 mm polyp was found in the rectum. The polyp was                            sessile. The polyp was removed with a cold snare.                            Resection and retrieval were complete.                           Many small and large-mouthed diverticula were found                            in the entire colon. Highest burden in the left                            colon with severe diverticulosis.                           Internal hemorrhoids were found during retroflexion.                           The exam was otherwise without  abnormality. Complications:            No immediate complications. Estimated blood loss:                            Minimal. Estimated Blood Loss:     Estimated blood loss was minimal. Impression:               - One 3 mm polyp in the ascending colon, removed                            with a cold snare. Resected and retrieved.                           - One 3 mm polyp in the sigmoid colon, removed with                            a cold snare. Resected and retrieved.                           - One  5 mm polyp at the recto-sigmoid colon,                            removed with a cold snare. Resected and retrieved.                           - One 3 mm polyp in the rectum, removed with a cold                            snare. Resected and retrieved.                           - Diverticulosis in the entire examined colon.                           - Internal hemorrhoids.                           - The examination was otherwise normal. Recommendation:           - Patient has a contact number available for                            emergencies. The signs and symptoms of potential                            delayed complications were discussed with the                            patient. Return to normal activities tomorrow.                            Written discharge instructions were provided to the                            patient.                           - Resume previous diet.                           - Continue present medications.                           - Await pathology results. Remo Lipps P. Armbruster, MD 12/26/2018 9:43:51 AM This report has been signed electronically.

## 2018-12-26 NOTE — Patient Instructions (Signed)
Information on polyps, diverticulosis, and hemorrhoids given to you today.  Await pathology results.  YOU HAD AN ENDOSCOPIC PROCEDURE TODAY AT [facility]:   Refer to the procedure report that was given to you for any specific questions about what was found during the examination.  If the procedure report does not answer your questions, please call your gastroenterologist to clarify.  If you requested that your care partner not be given the details of your procedure findings, then the procedure report has been included in a sealed envelope for you to review at your convenience later.  YOU SHOULD EXPECT: Some feelings of bloating in the abdomen. Passage of more gas than usual.  Walking can help get rid of the air that was put into your GI tract during the procedure and reduce the bloating. If you had a lower endoscopy (such as a colonoscopy or flexible sigmoidoscopy) you may notice spotting of blood in your stool or on the toilet paper. If you underwent a bowel prep for your procedure, you may not have a normal bowel movement for a few days.  Please Note:  You might notice some irritation and congestion in your nose or some drainage.  This is from the oxygen used during your procedure.  There is no need for concern and it should clear up in a day or so.  SYMPTOMS TO REPORT IMMEDIATELY:   Following lower endoscopy (colonoscopy or flexible sigmoidoscopy):  Excessive amounts of blood in the stool  Significant tenderness or worsening of abdominal pains  Swelling of the abdomen that is new, acute  Fever of 100F or higher  For urgent or emergent issues, a gastroenterologist can be reached at any hour by calling [phone].   DIET:  We do recommend a small meal at first, but then you may proceed to your regular diet.  Drink plenty of fluids but you should avoid alcoholic beverages for 24 hours.  ACTIVITY:  You should plan to take it easy for the rest of today and you should NOT  DRIVE or use heavy machinery until tomorrow (because of the sedation medicines used during the test).    FOLLOW UP: Our staff will call the number listed on your records the next business day following your procedure to check on you and address any questions or concerns that you may have regarding the information given to you following your procedure. If we do not reach you, we will leave a message.  However, if you are feeling well and you are not experiencing any problems, there is no need to return our call.  We will assume that you have returned to your regular daily activities without incident.  If any biopsies were taken you will be contacted by phone or by letter within the next 1-3 weeks.  Please call us at [phone] if you have not heard about the biopsies in 3 weeks.    SIGNATURES/CONFIDENTIALITY: You and/or your care partner have signed paperwork which will be entered into your electronic medical record.  These signatures attest to the fact that that the information above on your After Visit Summary has been reviewed and is understood.  Full responsibility of the confidentiality of this discharge information lies with you and/or your care-partner.

## 2018-12-26 NOTE — Progress Notes (Signed)
Pt's states no medical or surgical changes since previsit or office visit. 

## 2018-12-27 ENCOUNTER — Telehealth: Payer: Self-pay

## 2018-12-27 ENCOUNTER — Telehealth: Payer: Self-pay | Admitting: *Deleted

## 2018-12-27 NOTE — Telephone Encounter (Signed)
  Follow up Call-  Call back number 12/26/2018  Post procedure Call Back phone  # [phone]- cell  Permission to leave phone message Yes  Some recent data might be hidden     Patient questions:   Message left to call us if necessary. Second call.

## 2018-12-27 NOTE — Telephone Encounter (Signed)
  Follow up Call-  Call back number 12/26/2018  Post procedure Call Back phone  # [phone]- cell  Permission to leave phone message Yes  Some recent data might be hidden     No ID on voicemail No message left

## 2019-01-09 ENCOUNTER — Other Ambulatory Visit: Payer: Self-pay | Admitting: Internal Medicine

## 2019-01-10 ENCOUNTER — Other Ambulatory Visit: Payer: Self-pay | Admitting: Internal Medicine

## 2019-01-13 NOTE — Telephone Encounter (Signed)
Done erx 

## 2019-01-21 ENCOUNTER — Other Ambulatory Visit: Payer: Self-pay | Admitting: Internal Medicine

## 2019-02-10 ENCOUNTER — Telehealth: Payer: Self-pay | Admitting: Internal Medicine

## 2019-02-10 NOTE — Telephone Encounter (Signed)
Cancelled.  

## 2019-02-10 NOTE — Telephone Encounter (Signed)
Copied from [place] [id number]. Topic: Quick Communication - Appointment Cancellation >> Feb 10, 2019  8:31 AM David Bolton wrote: Patient [place] the [place] and did not realize he had an appt this week 4/15. Patient states he wants to cancel.

## 2019-02-12 ENCOUNTER — Ambulatory Visit: Payer: Medicare Other | Admitting: Internal Medicine

## 2019-02-20 ENCOUNTER — Other Ambulatory Visit: Payer: Self-pay | Admitting: Internal Medicine

## 2019-02-24 ENCOUNTER — Other Ambulatory Visit: Payer: Self-pay | Admitting: Internal Medicine

## 2019-02-24 NOTE — Telephone Encounter (Signed)
Done erx 

## 2019-04-02 ENCOUNTER — Other Ambulatory Visit: Payer: Self-pay | Admitting: Internal Medicine

## 2019-04-02 NOTE — Telephone Encounter (Signed)
Done erx 

## 2019-04-29 ENCOUNTER — Other Ambulatory Visit: Payer: Self-pay | Admitting: Internal Medicine

## 2019-05-06 ENCOUNTER — Other Ambulatory Visit: Payer: Self-pay | Admitting: Internal Medicine

## 2019-05-06 NOTE — Telephone Encounter (Signed)
Done erx 

## 2019-05-24 ENCOUNTER — Other Ambulatory Visit: Payer: Self-pay | Admitting: Internal Medicine

## 2019-05-26 ENCOUNTER — Telehealth: Payer: Self-pay | Admitting: Internal Medicine

## 2019-05-26 MED ORDER — METFORMIN HCL ER 500 MG PO TB24: 1500.0000 mg | ORAL_TABLET | Freq: Every day | ORAL | 0 refills | Status: DC

## 2019-05-26 NOTE — Telephone Encounter (Signed)
Copied from [place] [phone] [phone]. Topic: Quick Communication - Rx Refill/Question >> May 26, 2019  3:19 PM Leward Quan A wrote: Medication: metFORMIN (GLUCOPHAGE-XR) 500 MG 24 hr tablet   Has the patient contacted their pharmacy? Yes.   (Agent: If no, request that the patient contact the pharmacy for the refill.) (Agent: If yes, when and what did the pharmacy advise?)  Preferred Pharmacy (with phone number or street name): [facility] # [address] [place] [phone] (Phone) [phone] (Fax)    Agent: Please be advised that RX refills may take up to 3 business days. We ask that you follow-up with your pharmacy.

## 2019-05-29 ENCOUNTER — Other Ambulatory Visit: Payer: Self-pay

## 2019-05-30 ENCOUNTER — Other Ambulatory Visit (INDEPENDENT_AMBULATORY_CARE_PROVIDER_SITE_OTHER): Payer: Medicare Other

## 2019-05-30 ENCOUNTER — Encounter: Payer: Self-pay | Admitting: Internal Medicine

## 2019-05-30 ENCOUNTER — Other Ambulatory Visit: Payer: Self-pay

## 2019-05-30 ENCOUNTER — Ambulatory Visit (INDEPENDENT_AMBULATORY_CARE_PROVIDER_SITE_OTHER): Payer: Medicare Other | Admitting: Internal Medicine

## 2019-05-30 VITALS — BP 124/86 | HR 67 | Temp 98.0°F | Ht 70.0 in | Wt 175.0 lb

## 2019-05-30 DIAGNOSIS — E119 Type 2 diabetes mellitus without complications: Secondary | ICD-10-CM | POA: Diagnosis not present

## 2019-05-30 DIAGNOSIS — N320 Bladder-neck obstruction: Secondary | ICD-10-CM

## 2019-05-30 DIAGNOSIS — I10 Essential (primary) hypertension: Secondary | ICD-10-CM

## 2019-05-30 DIAGNOSIS — E785 Hyperlipidemia, unspecified: Secondary | ICD-10-CM

## 2019-05-30 DIAGNOSIS — E611 Iron deficiency: Secondary | ICD-10-CM

## 2019-05-30 DIAGNOSIS — E538 Deficiency of other specified B group vitamins: Secondary | ICD-10-CM

## 2019-05-30 DIAGNOSIS — R011 Cardiac murmur, unspecified: Secondary | ICD-10-CM | POA: Diagnosis not present

## 2019-05-30 DIAGNOSIS — E559 Vitamin D deficiency, unspecified: Secondary | ICD-10-CM

## 2019-05-30 DIAGNOSIS — I350 Nonrheumatic aortic (valve) stenosis: Secondary | ICD-10-CM | POA: Insufficient documentation

## 2019-05-30 LAB — CBC WITH DIFFERENTIAL/PLATELET
Basophils Absolute: 0 10*3/uL (ref 0.0–0.1)
Basophils Relative: 0.8 % (ref 0.0–3.0)
Eosinophils Absolute: 0.1 10*3/uL (ref 0.0–0.7)
Eosinophils Relative: 2.2 % (ref 0.0–5.0)
HCT: 39.2 % (ref 39.0–52.0)
Hemoglobin: 13 g/dL (ref 13.0–17.0)
Lymphocytes Relative: 22.3 % (ref 12.0–46.0)
Lymphs Abs: 1.4 10*3/uL (ref 0.7–4.0)
MCHC: 33.1 g/dL (ref 30.0–36.0)
MCV: 87.5 fl (ref 78.0–100.0)
Monocytes Absolute: 0.6 10*3/uL (ref 0.1–1.0)
Monocytes Relative: 10.3 % (ref 3.0–12.0)
Neutro Abs: 3.9 10*3/uL (ref 1.4–7.7)
Neutrophils Relative %: 64.4 % (ref 43.0–77.0)
Platelets: 267 10*3/uL (ref 150.0–400.0)
RBC: 4.48 Mil/uL (ref 4.22–5.81)
RDW: 13.2 % (ref 11.5–15.5)
WBC: 6.1 10*3/uL (ref 4.0–10.5)

## 2019-05-30 LAB — URINALYSIS, ROUTINE W REFLEX MICROSCOPIC
Bilirubin Urine: NEGATIVE
Hgb urine dipstick: NEGATIVE
Ketones, ur: NEGATIVE
Leukocytes,Ua: NEGATIVE
Nitrite: NEGATIVE
RBC / HPF: NONE SEEN (ref 0–?)
Specific Gravity, Urine: 1.025 (ref 1.000–1.030)
Total Protein, Urine: NEGATIVE
Urine Glucose: NEGATIVE
Urobilinogen, UA: 0.2 (ref 0.0–1.0)
pH: 6 (ref 5.0–8.0)

## 2019-05-30 LAB — HEPATIC FUNCTION PANEL
ALT: 11 U/L (ref 0–53)
AST: 20 U/L (ref 0–37)
Albumin: 4.4 g/dL (ref 3.5–5.2)
Alkaline Phosphatase: 59 U/L (ref 39–117)
Bilirubin, Direct: 0.2 mg/dL (ref 0.0–0.3)
Total Bilirubin: 0.8 mg/dL (ref 0.2–1.2)
Total Protein: 7.2 g/dL (ref 6.0–8.3)

## 2019-05-30 LAB — MICROALBUMIN / CREATININE URINE RATIO
Creatinine,U: 135.2 mg/dL
Microalb Creat Ratio: 2.6 mg/g (ref 0.0–30.0)
Microalb, Ur: 3.5 mg/dL — ABNORMAL HIGH (ref 0.0–1.9)

## 2019-05-30 LAB — BASIC METABOLIC PANEL
BUN: 14 mg/dL (ref 6–23)
CO2: 26 mEq/L (ref 19–32)
Calcium: 9.6 mg/dL (ref 8.4–10.5)
Chloride: 104 mEq/L (ref 96–112)
Creatinine, Ser: 0.83 mg/dL (ref 0.40–1.50)
GFR: 90.16 mL/min (ref >60.00)
Glucose, Bld: 137 mg/dL — ABNORMAL HIGH (ref 70–99)
Potassium: 4.2 mEq/L (ref 3.5–5.1)
Sodium: 138 mEq/L (ref 135–145)

## 2019-05-30 LAB — LIPID PANEL
Cholesterol: 139 mg/dL (ref 0–200)
HDL: 58.8 mg/dL (ref >39.00)
LDL Cholesterol: 62 mg/dL (ref 0–99)
NonHDL: 79.9
Total CHOL/HDL Ratio: 2
Triglycerides: 91 mg/dL (ref 0.0–149.0)
VLDL: 18.2 mg/dL (ref 0.0–40.0)

## 2019-05-30 LAB — VITAMIN B12: Vitamin B-12: 229 pg/mL (ref 211–911)

## 2019-05-30 LAB — HEMOGLOBIN A1C: Hgb A1c MFr Bld: 6.8 % — ABNORMAL HIGH (ref 4.6–6.5)

## 2019-05-30 LAB — TSH: TSH: 1.74 u[IU]/mL (ref 0.35–4.50)

## 2019-05-30 LAB — IBC PANEL
Iron: 99 ug/dL (ref 42–165)
Saturation Ratios: 22 % (ref 20.0–50.0)
Transferrin: 322 mg/dL (ref 212.0–360.0)

## 2019-05-30 LAB — PSA: PSA: 2.72 ng/mL (ref 0.10–4.00)

## 2019-05-30 LAB — VITAMIN D 25 HYDROXY (VIT D DEFICIENCY, FRACTURES): VITD: 62.99 ng/mL (ref 30.00–100.00)

## 2019-05-30 NOTE — Progress Notes (Addendum)
Subjective:    Patient ID: David Bolton, male    DOB: 03-22-1941, 78 y.o.   MRN: [id number]  HPI  Here to f/u; overall doing ok,  Pt denies chest pain, increasing sob or doe, wheezing, orthopnea, PND, increased LE swelling, palpitations, dizziness or syncope.  Pt denies new neurological symptoms such as new headache, or facial or extremity weakness or numbness.  Pt denies polydipsia, polyuria, or low sugar episode.  Pt states overall good compliance with meds, mostly trying to follow appropriate diet, with wt overall stable,  but little exercise however. Has been walking 5 miles per day and better diet, losing wt, less calorie intake during the hot months.  Peak wt has been 200 in the past. Wt Readings from Last 3 Encounters:  05/30/19 175 lb (79.4 kg)  12/26/18 184 lb (83.5 kg)  12/12/18 184 lb (83.5 kg)  Plans to have eye doctor appt soon, but delayed due to pandemic.  No new complaints Past Medical History:  Diagnosis Date  . ALLERGIC RHINITIS 06/08/2007  . ANXIETY 06/08/2007  . Cervicalgia 12/05/2007  . COLONIC POLYPS, HX OF 06/08/2007  . Cough 08/12/2010  . DEGENERATION, CERVICAL DISC 07/30/2007  . DEPENDENCE, ALCOHOL NEC/NOS, IN REMISSION 06/08/2007  . DEPRESSION 06/08/2007  . Diabetes mellitus without complication ([place])   . DIVERTICULOSIS, COLON 06/08/2007  . EPIGASTRIC TENDERNESS 12/05/2007  . FATIGUE 09/10/2008  . GERD 06/08/2007  . Headache(784.0) 06/08/2007  . HYPERLIPIDEMIA 06/08/2007  . HYPERTENSION 06/08/2007  . IBS 06/08/2007  . Impaired glucose tolerance 02/10/2011  . LOW BACK PAIN, CHRONIC 08/12/2010  . Nocturia 07/16/2009  . PILONIDAL CYST 06/08/2007  . PLANTAR FASCIITIS 06/08/2007   Past Surgical History:  Procedure Laterality Date  . bladder and prostate surgury    . CERVICAL FUSION    . COLONOSCOPY  last colon 11/03/2008   w/Brodie=normal exam  . pilonidal cyst surgury      reports that he has never smoked. He has never used smokeless tobacco. He reports that he does not drink  alcohol or use drugs. family history includes Heart attack in his paternal aunt; Heart disease in his mother; Kidney disease in an other family member. Allergies  Allergen Reactions  . Sulfa Antibiotics Rash    Breaks out around private parts  . Doxycycline Hyclate   . Peanut-Containing Drug Products     Nose bleed, headache  . Sulfonamide Derivatives    Current Outpatient Medications on File Prior to Visit  Medication Sig Dispense Refill  . amLODipine (NORVASC) 5 MG tablet take 1 tablet by mouth daily 90 tablet 0  . aspirin EC 81 MG EC tablet Take 81 mg by mouth daily.      Marland Kitchen atorvastatin (LIPITOR) 20 MG tablet take 1 tablet by mouth daily 90 tablet 1  . Cholecalciferol (VITAMIN D3 PO) Take 125 mcg by mouth daily.    . Glucosamine 500 MG TABS Take by mouth.      Marland Kitchen HYDROcodone-acetaminophen (NORCO) 10-325 MG tablet take 1 tablet by mouth at bedtime as needed 30 tablet 0  . lisinopril (ZESTRIL) 40 MG tablet TAKE ONE TABLET BY MOUTH ONE TIME DAILY  90 tablet 0  . metFORMIN (GLUCOPHAGE-XR) 500 MG 24 hr tablet Take 3 tablets (1,500 mg total) by mouth daily with breakfast. 270 tablet 0  . metoprolol succinate (TOPROL-XL) 50 MG 24 hr tablet take 1/2 tablet by mouth once a day 90 tablet 0  . Multiple Vitamin (MULTIVITAMIN) capsule Take 1 capsule by mouth daily.      Marland Kitchen  Saw Palmetto 450 MG CAPS Take 2 capsules by mouth daily.     No current facility-administered medications on file prior to visit.    Review of Systems  Constitutional: Negative for other unusual diaphoresis or sweats HENT: Negative for ear discharge or swelling Eyes: Negative for other worsening visual disturbances Respiratory: Negative for stridor or other swelling  Gastrointestinal: Negative for worsening distension or other blood Genitourinary: Negative for retention or other urinary change Musculoskeletal: Negative for other MSK pain or swelling Skin: Negative for color change or other new lesions Neurological: Negative  for worsening tremors and other numbness  Psychiatric/Behavioral: Negative for worsening agitation or other fatigue All other system neg per pt    Objective:   Physical Exam BP 124/86   Pulse 67   Temp 98 F (36.7 C) (Oral)   Ht 5\' 10"  (1.778 m)   Wt 175 lb (79.4 kg)   SpO2 98%   BMI 25.11 kg/m  VS noted,  Constitutional: Pt appears in NAD HENT: Head: NCAT.  Right Ear: External ear normal.  Left Ear: External ear normal.  Eyes: . Pupils are equal, round, and reactive to light. Conjunctivae and EOM are normal Nose: without d/c or deformity Neck: Neck supple. Gross normal ROM Cardiovascular: Normal rate and regular rhythm.  Gr 3/6 syst murmur LUSB - new it seems Pulmonary/Chest: Effort normal and breath sounds without rales or wheezing.  Abd:  Soft, NT, ND, + BS, no organomegaly Neurological: Pt is alert. At baseline orientation, motor grossly intact Skin: Skin is warm. No rashes, other new lesions, no LE edema Psychiatric: Pt behavior is normal without agitation  No other exam findings Lab Results  Component Value Date   WBC 6.2 08/08/2018   HGB 14.7 08/08/2018   HCT 43.6 08/08/2018   PLT 249.0 08/08/2018   GLUCOSE 140 (H) 08/08/2018   CHOL 145 08/08/2018   TRIG 134.0 08/08/2018   HDL 55.40 08/08/2018   LDLDIRECT 139.0 02/10/2011   LDLCALC 63 08/08/2018   ALT 18 08/08/2018   AST 25 08/08/2018   NA 138 08/08/2018   K 4.3 08/08/2018   CL 102 08/08/2018   CREATININE 0.87 08/08/2018   BUN 12 08/08/2018   CO2 27 08/08/2018   TSH 2.47 08/08/2018   PSA 2.57 08/08/2018   HGBA1C 6.8 (H) 08/08/2018   MICROALBUR 4.1 (H) 08/08/2018   ECG I have personally interpreted - NSR 53    Assessment & Plan:

## 2019-05-30 NOTE — Assessment & Plan Note (Signed)
stable overall by history and exam, recent data reviewed with pt, and pt to continue medical treatment as before,  to f/u any worsening symptoms or concerns  

## 2019-05-30 NOTE — Assessment & Plan Note (Signed)
?   AS vs other, for ecg today, and echo

## 2019-05-30 NOTE — Patient Instructions (Signed)
You had the EKG today and this was OK  Please continue all other medications as before, and refills have been done if requested.  Please have the pharmacy call with any other refills you may need.  Please continue your efforts at being more active, low cholesterol diet, and weight control.  You are otherwise up to date with prevention measures today.  Please keep your appointments with your specialists as you may have planned  You will be contacted regarding the referral for: echocardiogram  Please go to the LAB in the Basement (turn left off the elevator) for the tests to be done today  You will be contacted by phone if any changes need to be made immediately.  Otherwise, you will receive a letter about your results with an explanation, but please check with MyChart first.  Please remember to sign up for MyChart if you have not done so, as this will be important to you in the future with finding out test results, communicating by private email, and scheduling acute appointments online when needed.  Please return in 6 months, or sooner if needed

## 2019-05-30 NOTE — Assessment & Plan Note (Signed)
stable overall by history and exam, recent data reviewed with pt, and pt to continue medical treatment as before,  to f/u any worsening symptoms or concerns, for labs today 

## 2019-06-06 ENCOUNTER — Other Ambulatory Visit: Payer: Self-pay

## 2019-06-06 ENCOUNTER — Ambulatory Visit (HOSPITAL_COMMUNITY): Payer: Medicare Other | Attending: Internal Medicine

## 2019-06-06 DIAGNOSIS — R011 Cardiac murmur, unspecified: Secondary | ICD-10-CM | POA: Diagnosis not present

## 2019-06-06 DIAGNOSIS — I350 Nonrheumatic aortic (valve) stenosis: Secondary | ICD-10-CM

## 2019-06-06 HISTORY — DX: Nonrheumatic aortic (valve) stenosis: I35.0

## 2019-06-07 ENCOUNTER — Other Ambulatory Visit: Payer: Self-pay | Admitting: Internal Medicine

## 2019-06-07 DIAGNOSIS — I350 Nonrheumatic aortic (valve) stenosis: Secondary | ICD-10-CM

## 2019-06-09 ENCOUNTER — Other Ambulatory Visit: Payer: Self-pay | Admitting: Internal Medicine

## 2019-06-10 ENCOUNTER — Telehealth: Payer: Self-pay

## 2019-06-10 NOTE — Telephone Encounter (Signed)
-----   Message from Biagio Borg, MD sent at 06/07/2019  4:08 PM EDT ----- Left message on MyChart, pt to cont same tx except  The test results show that your current treatment is OK, as the heart function is normal, but the Aortic Valve now has moderate stenosis as I mentioned was possible at your office visit.  Although you may not be having too much symptoms, we should refer you to Cardiology for more longer term follow up as this stenosis can get more worse in the future and require treatment.Redmond Baseman to please inform pt, I will do referral

## 2019-06-10 NOTE — Telephone Encounter (Signed)
Called pt, LVM.   CRM created.  

## 2019-06-18 ENCOUNTER — Other Ambulatory Visit: Payer: Self-pay | Admitting: Internal Medicine

## 2019-06-18 NOTE — Telephone Encounter (Signed)
Done erx 

## 2019-07-08 NOTE — Progress Notes (Deleted)
Cardiology Office Note:    Date:  07/08/2019   ID:  ADANTE HUSSER, DOB 1941/08/20, MRN [id number]  PCP:  Biagio Borg, MD  Cardiologist:  No primary care provider on file.  Electrophysiologist:  None   Referring MD: Biagio Borg, MD   No chief complaint on file. ***  History of Present Illness:    David Bolton is a 78 y.o. male with a hx of diabetes, hyperlipidemia who is referred by Dr. Jenny Reichmann for an evaluation of aortic stenosis.  He was noted by Dr. Jenny Reichmann to have a heart murmur on exam.  A transthoracic echocardiogram was ordered, which showed moderate aortic stenosis (Vmax 3.57 m/s, mean gradient 23 mmHg, AVA 1.33 cm).  Normal ejection fraction, mild asymmetric LVH, grade 1 diastolic dysfunction, normal RV function  Past Medical History:  Diagnosis Date  . ALLERGIC RHINITIS 06/08/2007  . ANXIETY 06/08/2007  . Cervicalgia 12/05/2007  . COLONIC POLYPS, HX OF 06/08/2007  . Cough 08/12/2010  . DEGENERATION, CERVICAL DISC 07/30/2007  . DEPENDENCE, ALCOHOL NEC/NOS, IN REMISSION 06/08/2007  . DEPRESSION 06/08/2007  . Diabetes mellitus without complication ([place])   . DIVERTICULOSIS, COLON 06/08/2007  . EPIGASTRIC TENDERNESS 12/05/2007  . FATIGUE 09/10/2008  . GERD 06/08/2007  . Headache(784.0) 06/08/2007  . HYPERLIPIDEMIA 06/08/2007  . HYPERTENSION 06/08/2007  . IBS 06/08/2007  . Impaired glucose tolerance 02/10/2011  . LOW BACK PAIN, CHRONIC 08/12/2010  . Nocturia 07/16/2009  . PILONIDAL CYST 06/08/2007  . PLANTAR FASCIITIS 06/08/2007    Past Surgical History:  Procedure Laterality Date  . bladder and prostate surgury    . CERVICAL FUSION    . COLONOSCOPY  last colon 11/03/2008   w/Brodie=normal exam  . pilonidal cyst surgury      Current Medications: No outpatient medications have been marked as taking for the 07/09/19 encounter (Appointment) with Donato Heinz, MD.     Allergies:   Sulfa antibiotics, Doxycycline hyclate, Peanut-containing drug products, and Sulfonamide derivatives    Social History   Socioeconomic History  . Marital status: Divorced    Spouse name: Not on file  . Number of children: 3  . Years of education: Not on file  . Highest education level: Not on file  Occupational History  . Occupation: retired Engineering geologist delivery  Social Needs  . Financial resource strain: Not on file  . Food insecurity    Worry: Not on file    Inability: Not on file  . Transportation needs    Medical: Not on file    Non-medical: Not on file  Tobacco Use  . Smoking status: Never Smoker  . Smokeless tobacco: Never Used  Substance and Sexual Activity  . Alcohol use: No  . Drug use: No  . Sexual activity: Not on file  Lifestyle  . Physical activity    Days per week: Not on file    Minutes per session: Not on file  . Stress: Not on file  Relationships  . Social Herbalist on phone: Not on file    Gets together: Not on file    Attends religious service: Not on file    Active member of club or organization: Not on file    Attends meetings of clubs or organizations: Not on file    Relationship status: Not on file  Other Topics Concern  . Not on file  Social History Narrative  . Not on file     Family History: The patient's ***family  history includes Heart attack in his paternal aunt; Heart disease in his mother; Kidney disease in an other family member. There is no history of Colon cancer, Colon polyps, Rectal cancer, or Stomach cancer.  ROS:   Please see the history of present illness.    *** All other systems reviewed and are negative.  EKGs/Labs/Other Studies Reviewed:    The following studies were reviewed today: ***  EKG:  EKG is *** ordered today.  The ekg ordered today demonstrates ***  Recent Labs: 05/30/2019: ALT 11; BUN 14; Creatinine, Ser 0.83; Hemoglobin 13.0; Platelets 267.0; Potassium 4.2; Sodium 138; TSH 1.74  Recent Lipid Panel    Component Value Date/Time   CHOL 139 05/30/2019 0931   TRIG 91.0  05/30/2019 0931   TRIG 250 (HH) 10/31/2006 1628   HDL 58.80 05/30/2019 0931   CHOLHDL 2 05/30/2019 0931   VLDL 18.2 05/30/2019 0931   LDLCALC 62 05/30/2019 0931   LDLDIRECT 139.0 02/10/2011 0916    Physical Exam:    VS:  There were no vitals taken for this visit.    Wt Readings from Last 3 Encounters:  05/30/19 175 lb (79.4 kg)  12/26/18 184 lb (83.5 kg)  12/12/18 184 lb (83.5 kg)     GEN: *** Well nourished, well developed in no acute distress HEENT: Normal NECK: No JVD; No carotid bruits LYMPHATICS: No lymphadenopathy CARDIAC: ***RRR, no murmurs, rubs, gallops RESPIRATORY:  Clear to auscultation without rales, wheezing or rhonchi  ABDOMEN: Soft, non-tender, non-distended MUSCULOSKELETAL:  No edema; No deformity  SKIN: Warm and dry NEUROLOGIC:  Alert and oriented x 3 PSYCHIATRIC:  Normal affect   ASSESSMENT:    No diagnosis found. PLAN:    In order of problems listed above:  Moderate AS: Vmax 3.57 m/s, mean gradient 23 mmHg, AVA 1.33 cm.  Normal EF - Annual follow-up  Hypertension: Continue lisinopril 40 mg daily, Toprol-XL 25 mg, amlodipine 5 mg  Hyperlipidemia: Continue atorvastatin 20 mg daily  Medication Adjustments/Labs and Tests Ordered: Current medicines are reviewed at length with the patient today.  Concerns regarding medicines are outlined above.  No orders of the defined types were placed in this encounter.  No orders of the defined types were placed in this encounter.   There are no Patient Instructions on file for this visit.   Signed, Donato Heinz, MD  07/08/2019 10:41 PM    [place]

## 2019-07-09 ENCOUNTER — Ambulatory Visit: Payer: Medicare Other | Admitting: Cardiology

## 2019-08-01 ENCOUNTER — Other Ambulatory Visit: Payer: Self-pay | Admitting: Internal Medicine

## 2019-08-04 ENCOUNTER — Other Ambulatory Visit: Payer: Self-pay | Admitting: Internal Medicine

## 2019-08-04 DIAGNOSIS — Z23 Encounter for immunization: Secondary | ICD-10-CM | POA: Diagnosis not present

## 2019-08-04 NOTE — Telephone Encounter (Signed)
Done erx 

## 2019-10-14 ENCOUNTER — Other Ambulatory Visit: Payer: Self-pay | Admitting: Internal Medicine

## 2019-11-24 ENCOUNTER — Other Ambulatory Visit: Payer: Self-pay | Admitting: Internal Medicine

## 2019-12-02 ENCOUNTER — Encounter: Payer: Self-pay | Admitting: Internal Medicine

## 2019-12-02 ENCOUNTER — Other Ambulatory Visit: Payer: Self-pay

## 2019-12-02 ENCOUNTER — Ambulatory Visit (INDEPENDENT_AMBULATORY_CARE_PROVIDER_SITE_OTHER): Payer: Medicare Other | Admitting: Internal Medicine

## 2019-12-02 VITALS — BP 144/80 | HR 66 | Temp 98.0°F | Ht 70.0 in | Wt 175.0 lb

## 2019-12-02 DIAGNOSIS — I10 Essential (primary) hypertension: Secondary | ICD-10-CM | POA: Diagnosis not present

## 2019-12-02 DIAGNOSIS — E785 Hyperlipidemia, unspecified: Secondary | ICD-10-CM

## 2019-12-02 DIAGNOSIS — M545 Low back pain: Secondary | ICD-10-CM | POA: Diagnosis not present

## 2019-12-02 DIAGNOSIS — E119 Type 2 diabetes mellitus without complications: Secondary | ICD-10-CM

## 2019-12-02 DIAGNOSIS — I350 Nonrheumatic aortic (valve) stenosis: Secondary | ICD-10-CM | POA: Diagnosis not present

## 2019-12-02 DIAGNOSIS — G8929 Other chronic pain: Secondary | ICD-10-CM

## 2019-12-02 LAB — BASIC METABOLIC PANEL
BUN: 11 mg/dL (ref 6–23)
CO2: 29 mEq/L (ref 19–32)
Calcium: 9.9 mg/dL (ref 8.4–10.5)
Chloride: 100 mEq/L (ref 96–112)
Creatinine, Ser: 0.95 mg/dL (ref 0.40–1.50)
GFR: 77.05 mL/min (ref >60.00)
Glucose, Bld: 141 mg/dL — ABNORMAL HIGH (ref 70–99)
Potassium: 4.9 mEq/L (ref 3.5–5.1)
Sodium: 137 mEq/L (ref 135–145)

## 2019-12-02 LAB — HEPATIC FUNCTION PANEL
ALT: 13 U/L (ref 0–53)
AST: 23 U/L (ref 0–37)
Albumin: 4.4 g/dL (ref 3.5–5.2)
Alkaline Phosphatase: 59 U/L (ref 39–117)
Bilirubin, Direct: 0.2 mg/dL (ref 0.0–0.3)
Total Bilirubin: 0.9 mg/dL (ref 0.2–1.2)
Total Protein: 7.7 g/dL (ref 6.0–8.3)

## 2019-12-02 LAB — LIPID PANEL
Cholesterol: 170 mg/dL (ref 0–200)
HDL: 59.1 mg/dL (ref >39.00)
LDL Cholesterol: 86 mg/dL (ref 0–99)
NonHDL: 110.88
Total CHOL/HDL Ratio: 3
Triglycerides: 123 mg/dL (ref 0.0–149.0)
VLDL: 24.6 mg/dL (ref 0.0–40.0)

## 2019-12-02 LAB — HEMOGLOBIN A1C: Hgb A1c MFr Bld: 6.6 % — ABNORMAL HIGH (ref 4.6–6.5)

## 2019-12-02 MED ORDER — HYDROCODONE-ACETAMINOPHEN 10-325 MG PO TABS: ORAL_TABLET | ORAL | 0 refills | Status: DC

## 2019-12-02 NOTE — Assessment & Plan Note (Signed)
Asympt, cont to follow, qustions answered

## 2019-12-02 NOTE — Progress Notes (Signed)
Subjective:    Patient ID: David Bolton, male    DOB: Sep 12, 1941, 79 y.o.   MRN: [id number]  HPI Here for yearly f/u;  Overall doing ok;  Pt denies Chest pain, worsening SOB, DOE, wheezing, orthopnea, PND, worsening LE edema, palpitations, dizziness or syncope.  Pt denies neurological change such as new headache, facial or extremity weakness.  Pt denies polydipsia, polyuria, or low sugar symptoms. Pt states overall good compliance with treatment and medications, good tolerability, and has been trying to follow appropriate diet.  Pt denies worsening depressive symptoms, suicidal ideation or panic. No fever, night sweats, wt loss, loss of appetite, or other constitutional symptoms.  Pt states good ability with ADL's, has low fall risk, home safety reviewed and adequate, no other significant changes in hearing or vision, and active with exercise with walking 5 miles per day.  Hs not had eye exam due to pandemic and brother died and moved recently in to an apartment.  Recent echo with normal EF and mod AS.  Pt continues to have recurring LBP without change in severity, bowel or bladder change, fever, wt loss,  worsening LE pain/numbness/weakness, gait change or falls. Wt Readings from Last 3 Encounters:  12/02/19 175 lb (79.4 kg)  05/30/19 175 lb (79.4 kg)  12/26/18 184 lb (83.5 kg)   Past Medical History:  Diagnosis Date  . ALLERGIC RHINITIS 06/08/2007  . ANXIETY 06/08/2007  . Cervicalgia 12/05/2007  . COLONIC POLYPS, HX OF 06/08/2007  . Cough 08/12/2010  . DEGENERATION, CERVICAL DISC 07/30/2007  . DEPENDENCE, ALCOHOL NEC/NOS, IN REMISSION 06/08/2007  . DEPRESSION 06/08/2007  . Diabetes mellitus without complication ([place])   . DIVERTICULOSIS, COLON 06/08/2007  . EPIGASTRIC TENDERNESS 12/05/2007  . FATIGUE 09/10/2008  . GERD 06/08/2007  . Headache(784.0) 06/08/2007  . HYPERLIPIDEMIA 06/08/2007  . HYPERTENSION 06/08/2007  . IBS 06/08/2007  . Impaired glucose tolerance 02/10/2011  . LOW BACK PAIN, CHRONIC  08/12/2010  . Nocturia 07/16/2009  . PILONIDAL CYST 06/08/2007  . PLANTAR FASCIITIS 06/08/2007   Past Surgical History:  Procedure Laterality Date  . bladder and prostate surgury    . CERVICAL FUSION    . COLONOSCOPY  last colon 11/03/2008   w/Brodie=normal exam  . pilonidal cyst surgury      reports that he has never smoked. He has never used smokeless tobacco. He reports that he does not drink alcohol or use drugs. family history includes Heart attack in his paternal aunt; Heart disease in his mother; Kidney disease in an other family member. Allergies  Allergen Reactions  . Sulfa Antibiotics Rash    Breaks out around private parts  . Doxycycline Hyclate   . Peanut-Containing Drug Products     Nose bleed, headache  . Sulfonamide Derivatives    Current Outpatient Medications on File Prior to Visit  Medication Sig Dispense Refill  . amLODipine (NORVASC) 5 MG tablet TAKE ONE TABLET BY MOUTH ONE TIME DAILY  90 tablet 1  . aspirin EC 81 MG EC tablet Take 81 mg by mouth daily.      Marland Kitchen atorvastatin (LIPITOR) 20 MG tablet take 1 tablet by mouth daily 90 tablet 1  . Cholecalciferol (VITAMIN D3 PO) Take 125 mcg by mouth daily.    . Ferrous Gluconate-C-Folic Acid (IRON-C PO) Take by mouth.    . Glucosamine 500 MG TABS Take by mouth.      Marland Kitchen HYDROcodone-acetaminophen (NORCO) 10-325 MG tablet TAKE ONE TABLET BY MOUTH DAILY AT BEDTIME AS NEEDED  30 tablet  0  . lisinopril (ZESTRIL) 40 MG tablet TAKE ONE TABLET BY MOUTH ONE TIME DAILY  90 tablet 1  . metFORMIN (GLUCOPHAGE-XR) 500 MG 24 hr tablet take 3 tablets by mouth daily with breakfast  270 tablet 0  . metoprolol succinate (TOPROL-XL) 50 MG 24 hr tablet TAKE HALF TABLET BY MOUTH DAILY  90 tablet 0  . Multiple Vitamin (MULTIVITAMIN) capsule Take 1 capsule by mouth daily.      . Saw Palmetto 450 MG CAPS Take 2 capsules by mouth daily.     No current facility-administered medications on file prior to visit.   Review of Systems All otherwise neg  per pt     Objective:   Physical Exam BP (!) 144/80   Pulse 66   Temp 98 F (36.7 C)   Ht 5\' 10"  (1.778 m)   Wt 175 lb (79.4 kg)   SpO2 99%   BMI 25.11 kg/m  VS noted,  Constitutional: Pt appears in NAD HENT: Head: NCAT.  Right Ear: External ear normal.  Left Ear: External ear normal.  Eyes: . Pupils are equal, round, and reactive to light. Conjunctivae and EOM are normal Nose: without d/c or deformity Neck: Neck supple. Gross normal ROM Cardiovascular: Normal rate and regular rhythm.  gr 2/6 LUSB murmur Pulmonary/Chest: Effort normal and breath sounds without rales or wheezing.  Abd:  Soft, NT, ND, + BS, no organomegaly Neurological: Pt is alert. At baseline orientation, motor grossly intact Skin: Skin is warm. No rashes, other new lesions, no LE edema Psychiatric: Pt behavior is normal without agitation  All otherwise neg per pt Lab Results  Component Value Date   WBC 6.1 05/30/2019   HGB 13.0 05/30/2019   HCT 39.2 05/30/2019   PLT 267.0 05/30/2019   GLUCOSE 137 (H) 05/30/2019   CHOL 139 05/30/2019   TRIG 91.0 05/30/2019   HDL 58.80 05/30/2019   LDLDIRECT 139.0 02/10/2011   LDLCALC 62 05/30/2019   ALT 11 05/30/2019   AST 20 05/30/2019   NA 138 05/30/2019   K 4.2 05/30/2019   CL 104 05/30/2019   CREATININE 0.83 05/30/2019   BUN 14 05/30/2019   CO2 26 05/30/2019   TSH 1.74 05/30/2019   PSA 2.72 05/30/2019   HGBA1C 6.8 (H) 05/30/2019   MICROALBUR 3.5 (H) 05/30/2019       Assessment & Plan:

## 2019-12-02 NOTE — Assessment & Plan Note (Signed)
stable overall by history and exam, recent data reviewed with pt, and pt to continue medical treatment as before,  to f/u any worsening symptoms or concerns  

## 2019-12-02 NOTE — Patient Instructions (Signed)
Please continue all other medications as before, and refills have been done if requested - the norco  Please have the pharmacy call with any other refills you may need.  Please continue your efforts at being more active, low cholesterol diet, and weight control.  You are otherwise up to date with prevention measures today.  Please keep your appointments with your specialists as you may have planned  Please go to the LAB at the blood drawing area for the tests to be done  You will be contacted by phone if any changes need to be made immediately.  Otherwise, you will receive a letter about your results with an explanation, but please check with MyChart first.  Please remember to sign up for MyChart if you have not done so, as this will be important to you in the future with finding out test results, communicating by private email, and scheduling acute appointments online when needed.  Please make an Appointment to return in 6 months, or sooner if needed

## 2019-12-02 NOTE — Assessment & Plan Note (Addendum)
stable overall by history and exam, recent data reviewed with pt, and pt to continue medical treatment as before,  to f/u any worsening symptoms or concerns  I spent 34 minutes preparing to see the patient by review of recent labs, imaging and procedures, obtaining and reviewing separately obtained history, communicating with the patient and family or caregiver, ordering medications, tests or procedures, and documenting clinical information in the EHR including the differential Dx, treatment, and any further evaluation and other management of mod AS, dm, LBP, HLD, HTN

## 2019-12-21 ENCOUNTER — Ambulatory Visit: Payer: Medicare Other | Attending: Internal Medicine

## 2019-12-21 ENCOUNTER — Other Ambulatory Visit: Payer: Self-pay

## 2019-12-21 DIAGNOSIS — Z23 Encounter for immunization: Secondary | ICD-10-CM | POA: Insufficient documentation

## 2019-12-21 NOTE — Progress Notes (Signed)
   Covid-19 Vaccination Clinic  Name:  David Bolton    MRN: [id number] DOB: February 07, 1941  12/21/2019  [name] was observed post Covid-19 immunization for 15 minutes without incidence. He was provided with Vaccine Information Sheet and instruction to access the V-Safe system.   [name] was instructed to call 911 with any severe reactions post vaccine: Marland Kitchen Difficulty breathing  . Swelling of your face and throat  . A fast heartbeat  . A bad rash all over your body  . Dizziness and weakness    Immunizations Administered    Name Date Dose VIS Date Route   Pfizer COVID-19 Vaccine 12/21/2019 10:32 AM 0.3 mL 10/10/2019 Intramuscular   Manufacturer: [place]   Lot: [id number]   [place]: [id number]

## 2019-12-30 IMAGING — DX DG CERVICAL SPINE COMPLETE 4+V
5 series · 5 of 5 positions shown · non-contrast
Comparison: None.

CLINICAL DATA: Cervical spine pain

EXAM:
CERVICAL SPINE - COMPLETE 4+ VIEW

[c-spine lat]
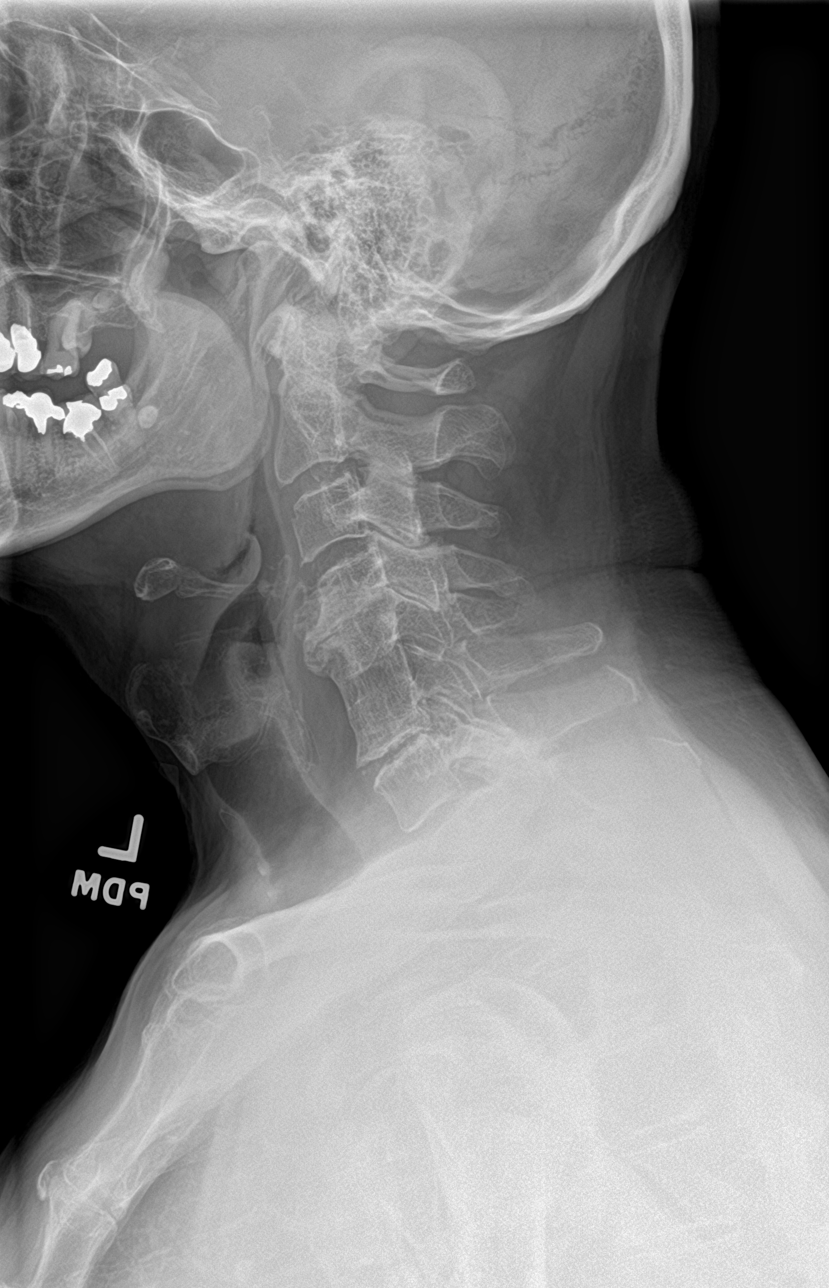

[c-spine obl (1 of 2)]
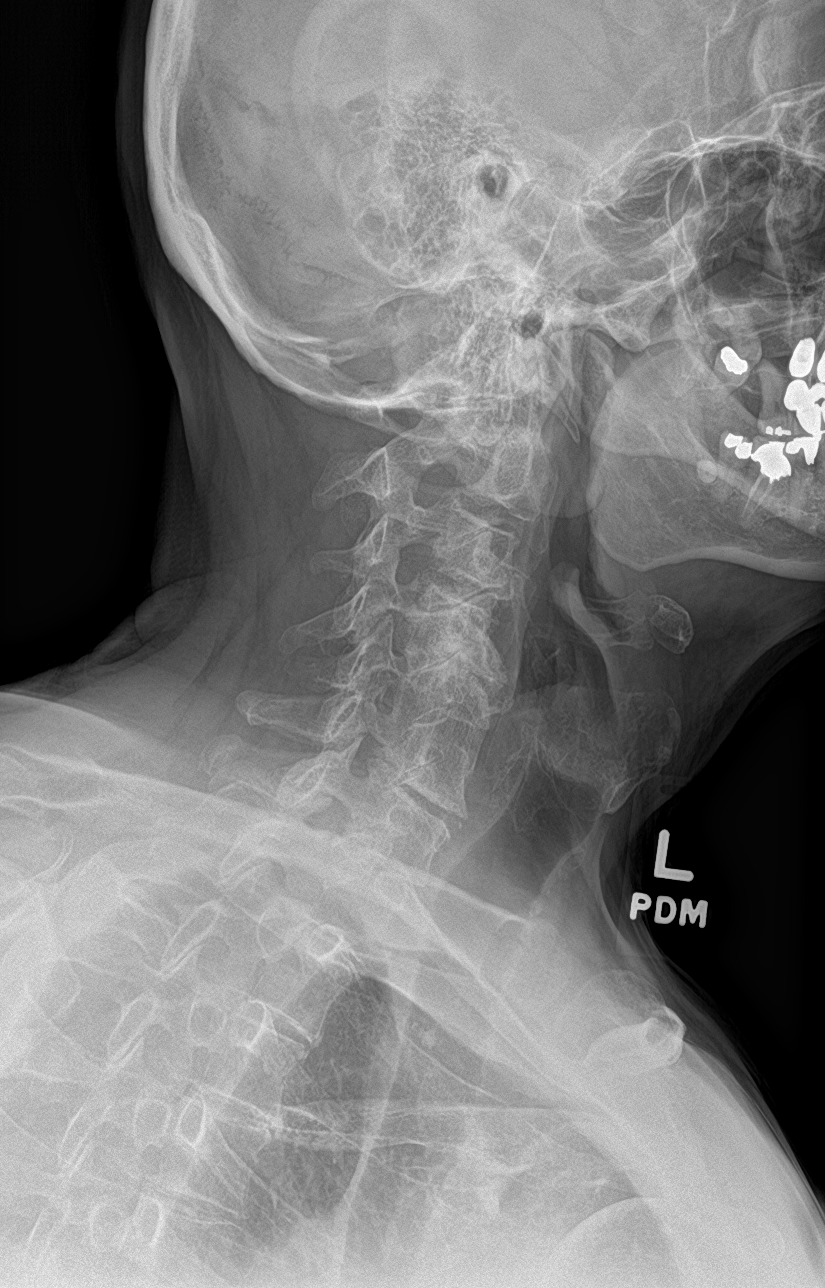

[c-spine obl (2 of 2)]
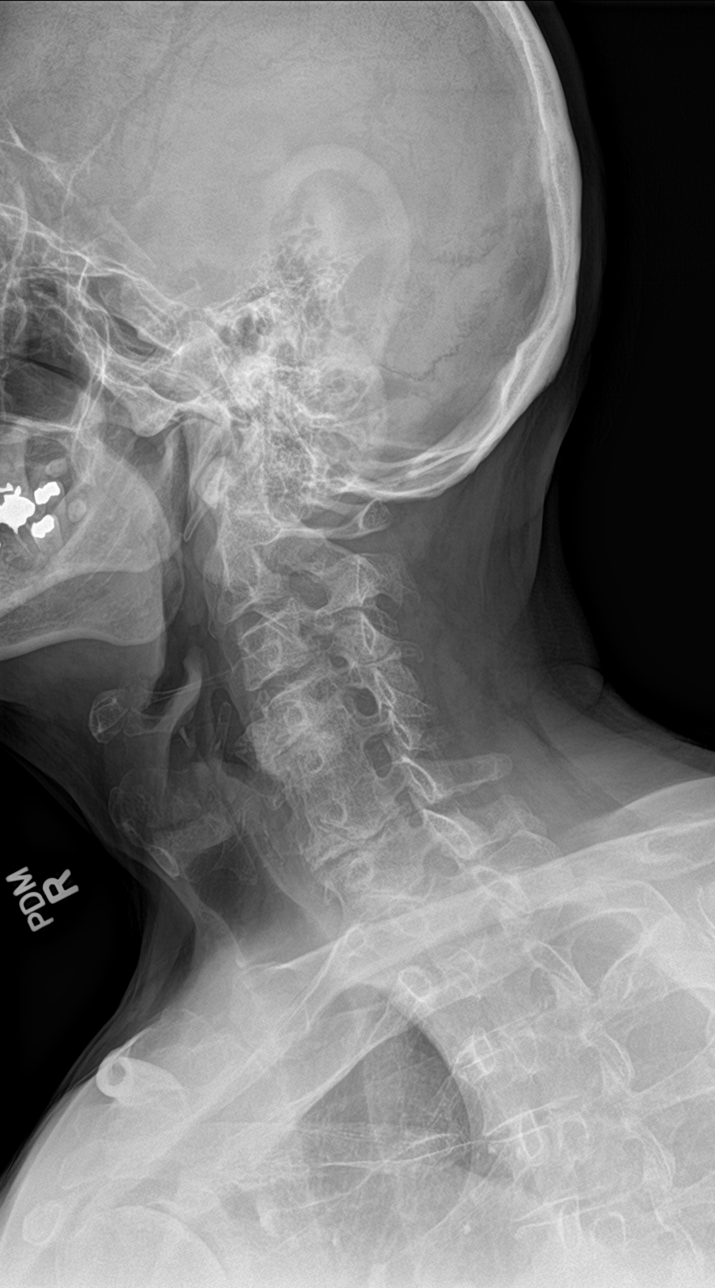

[c-spine ap]
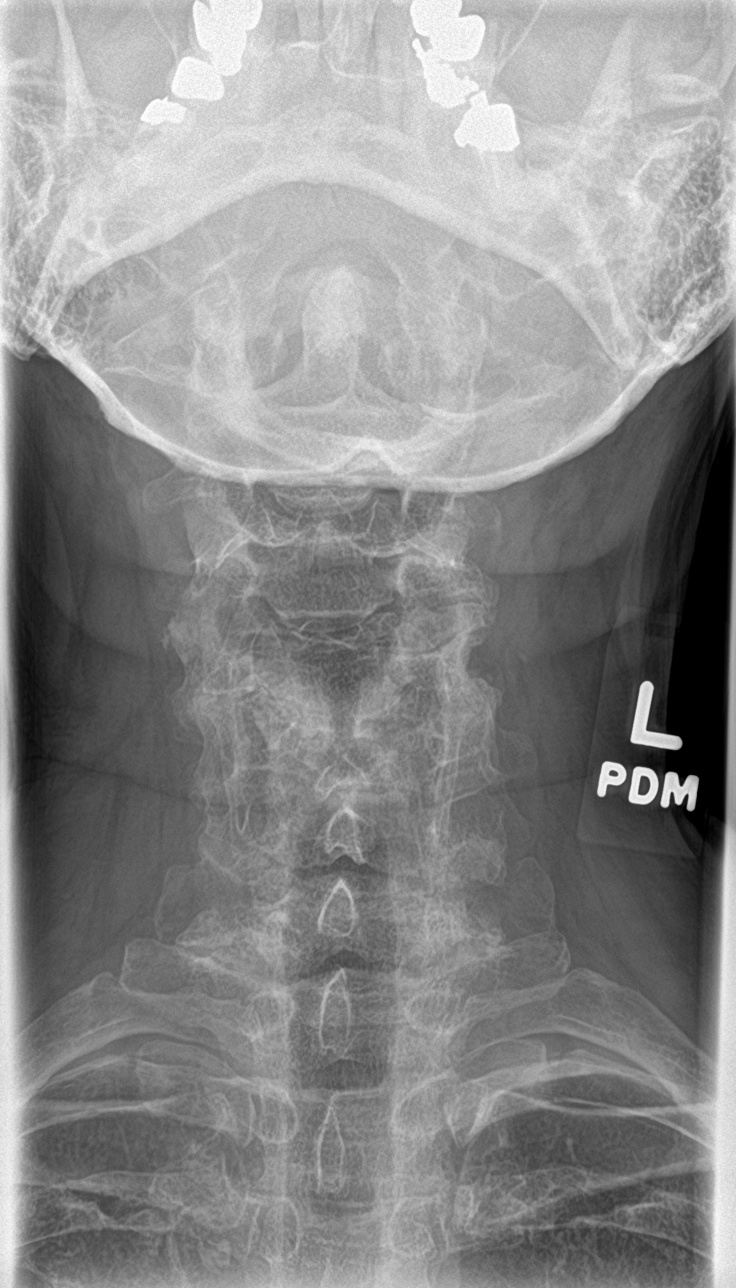

[c-spine open mouth]
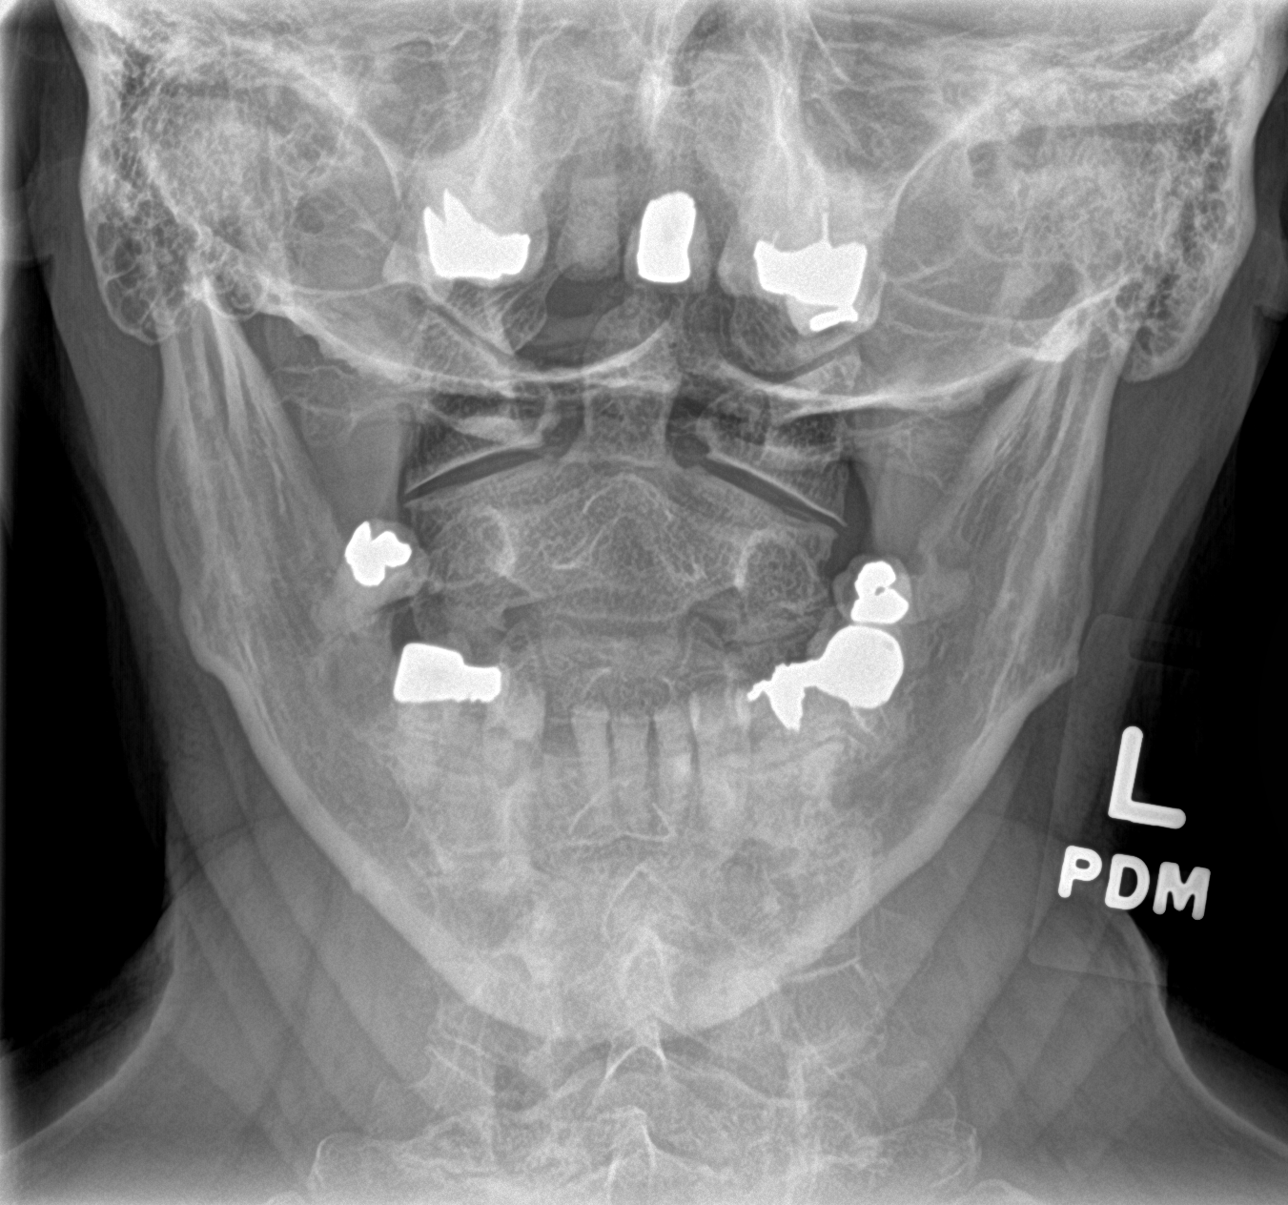

[5 of 5 positions shown; findings below may reference images not displayed]

FINDINGS: Effusion of the C4-5 and C5-6 disc spaces. Severe left neural
foraminal narrowing at C3-4 and mild left neural foraminal narrowing
at C4-5 and C6-7. Severe right neural foraminal narrowing at C5-6
and mild right neural foraminal narrowing at C6-7. No fracture or
subluxation. Prevertebral soft tissues are normal.
IMPRESSION: Fusion across the C4-5 and C5-6 disc spaces. Degenerative disc
disease at C6-7. Bilateral mild to severe multilevel neural
foraminal narrowing as above. No acute bony abnormality.

## 2020-01-02 ENCOUNTER — Other Ambulatory Visit: Payer: Self-pay | Admitting: Internal Medicine

## 2020-01-02 NOTE — Telephone Encounter (Signed)
Please refill as per office routine med refill policy (all routine meds refilled for 3 mo or monthly per pt preference up to one year from last visit, then month to month grace period for 3 mo, then further med refills will have to be denied)  

## 2020-01-05 MED ORDER — METOPROLOL SUCCINATE ER 50 MG PO TB24: ORAL_TABLET | ORAL | 0 refills | Status: DC

## 2020-01-05 NOTE — Telephone Encounter (Signed)
1st transmission failed re-sent to [facility]

## 2020-01-05 NOTE — Addendum Note (Signed)
Addended by: Earnstine Regal on: 01/05/2020 08:46 AM   Modules accepted: Orders

## 2020-01-13 ENCOUNTER — Ambulatory Visit: Payer: Medicare Other | Attending: Internal Medicine

## 2020-01-13 DIAGNOSIS — Z23 Encounter for immunization: Secondary | ICD-10-CM

## 2020-01-13 NOTE — Progress Notes (Signed)
   Covid-19 Vaccination Clinic  Name:  David Bolton    MRN: [id number] DOB: 1940-12-06  01/13/2020  Mr. David Bolton was observed post Covid-19 immunization for 15 minutes without incident. He was provided with Vaccine Information Sheet and instruction to access the V-Safe system.   Mr. David Bolton was instructed to call 911 with any severe reactions post vaccine: Marland Kitchen Difficulty breathing  . Swelling of face and throat  . A fast heartbeat  . A bad rash all over body  . Dizziness and weakness   Immunizations Administered    Name Date Dose VIS Date Route   Pfizer COVID-19 Vaccine 01/13/2020 10:50 AM 0.3 mL 10/10/2019 Intramuscular   Manufacturer: [place]   Lot: [id number]   [place]: [id number]

## 2020-01-29 ENCOUNTER — Other Ambulatory Visit: Payer: Self-pay | Admitting: Internal Medicine

## 2020-01-29 NOTE — Telephone Encounter (Signed)
Please refill as per office routine med refill policy (all routine meds refilled for 3 mo or monthly per pt preference up to one year from last visit, then month to month grace period for 3 mo, then further med refills will have to be denied)  

## 2020-01-30 ENCOUNTER — Other Ambulatory Visit: Payer: Self-pay | Admitting: Internal Medicine

## 2020-01-30 NOTE — Telephone Encounter (Signed)
Done erx 

## 2020-03-12 ENCOUNTER — Other Ambulatory Visit: Payer: Self-pay | Admitting: Internal Medicine

## 2020-03-12 NOTE — Telephone Encounter (Signed)
Please refill as per office routine med refill policy (all routine meds refilled for 3 mo or monthly per pt preference up to one year from last visit, then month to month grace period for 3 mo, then further med refills will have to be denied)  

## 2020-05-13 ENCOUNTER — Other Ambulatory Visit: Payer: Self-pay | Admitting: Internal Medicine

## 2020-05-13 NOTE — Telephone Encounter (Signed)
Please refill as per office routine med refill policy (all routine meds refilled for 3 mo or monthly per pt preference up to one year from last visit, then month to month grace period for 3 mo, then further med refills will have to be denied)  

## 2020-06-01 ENCOUNTER — Encounter: Payer: Self-pay | Admitting: Internal Medicine

## 2020-06-01 ENCOUNTER — Ambulatory Visit (INDEPENDENT_AMBULATORY_CARE_PROVIDER_SITE_OTHER): Payer: Medicare Other | Admitting: Internal Medicine

## 2020-06-01 ENCOUNTER — Other Ambulatory Visit: Payer: Self-pay

## 2020-06-01 VITALS — BP 142/90 | HR 62 | Temp 97.9°F | Ht 70.0 in | Wt 177.0 lb

## 2020-06-01 DIAGNOSIS — I10 Essential (primary) hypertension: Secondary | ICD-10-CM | POA: Diagnosis not present

## 2020-06-01 DIAGNOSIS — R972 Elevated prostate specific antigen [PSA]: Secondary | ICD-10-CM

## 2020-06-01 DIAGNOSIS — E785 Hyperlipidemia, unspecified: Secondary | ICD-10-CM

## 2020-06-01 DIAGNOSIS — Z23 Encounter for immunization: Secondary | ICD-10-CM

## 2020-06-01 DIAGNOSIS — Z1159 Encounter for screening for other viral diseases: Secondary | ICD-10-CM

## 2020-06-01 DIAGNOSIS — E119 Type 2 diabetes mellitus without complications: Secondary | ICD-10-CM

## 2020-06-01 MED ORDER — HYDROCODONE-ACETAMINOPHEN 10-325 MG PO TABS: ORAL_TABLET | ORAL | 0 refills | Status: DC

## 2020-06-01 NOTE — Patient Instructions (Addendum)
You had the Tdap tetanus shot today  Please remember to call for your yearly eye exam  Please continue all other medications as before, and refills have been done if requested  - the hydrocodone  Please have the pharmacy call with any other refills you may need.  Please continue your efforts at being more active, low cholesterol diet, and weight control.  You are otherwise up to date with prevention measures today.  Please keep your appointments with your specialists as you may have planned  Please go to the LAB at the blood drawing area for the tests to be done  You will be contacted by phone if any changes need to be made immediately.  Otherwise, you will receive a letter about your results with an explanation, but please check with MyChart first.  Please remember to sign up for MyChart if you have not done so, as this will be important to you in the future with finding out test results, communicating by private email, and scheduling acute appointments online when needed.  Please make an Appointment to return in 6 months, or sooner if needed

## 2020-06-01 NOTE — Progress Notes (Signed)
Subjective:    Patient ID: David Bolton, male    DOB: [birth year], 79 y.o.   MRN: [id number]  HPI  Here to f/u; overall doing ok,  Pt denies chest pain, increasing sob or doe, wheezing, orthopnea, PND, increased LE swelling, palpitations, dizziness or syncope.  Pt denies new neurological symptoms such as new headache, or facial or extremity weakness or numbness.  Pt denies polydipsia, polyuria, or low sugar episode.  Pt states overall good compliance with meds, mostly trying to follow appropriate diet, with wt overall stable,  but little exercise however.  Plans to make eye appt soon.  Denies urinary symptoms such as dysuria, frequency, urgency, flank pain, hematuria or n/v, fever, chills.  Past Medical History:  Diagnosis Date  . ALLERGIC RHINITIS 06/08/2007  . ANXIETY 06/08/2007  . Cervicalgia 12/05/2007  . COLONIC POLYPS, HX OF 06/08/2007  . Cough 08/12/2010  . DEGENERATION, CERVICAL DISC 07/30/2007  . DEPENDENCE, ALCOHOL NEC/NOS, IN REMISSION 06/08/2007  . DEPRESSION 06/08/2007  . Diabetes mellitus without complication ([place])   . DIVERTICULOSIS, COLON 06/08/2007  . EPIGASTRIC TENDERNESS 12/05/2007  . FATIGUE 09/10/2008  . GERD 06/08/2007  . Headache(784.0) 06/08/2007  . HYPERLIPIDEMIA 06/08/2007  . HYPERTENSION 06/08/2007  . IBS 06/08/2007  . Impaired glucose tolerance 02/10/2011  . LOW BACK PAIN, CHRONIC 08/12/2010  . Nocturia 07/16/2009  . PILONIDAL CYST 06/08/2007  . PLANTAR FASCIITIS 06/08/2007   Past Surgical History:  Procedure Laterality Date  . bladder and prostate surgury    . CERVICAL FUSION    . COLONOSCOPY  last colon 11/03/2008   w/Brodie=normal exam  . pilonidal cyst surgury      reports that he has never smoked. He has never used smokeless tobacco. He reports that he does not drink alcohol and does not use drugs. family history includes Heart attack in his paternal aunt; Heart disease in his mother; Kidney disease in an other family member. Allergies  Allergen Reactions  . Sulfa  Antibiotics Rash    Breaks out around private parts  . Doxycycline Hyclate   . Peanut-Containing Drug Products     Nose bleed, headache  . Sulfonamide Derivatives    Current Outpatient Medications on File Prior to Visit  Medication Sig Dispense Refill  . amLODipine (NORVASC) 5 MG tablet TAKE ONE TABLET BY MOUTH ONE TIME DAILY  90 tablet 1  . aspirin EC 81 MG EC tablet Take 81 mg by mouth daily.      Marland Kitchen atorvastatin (LIPITOR) 20 MG tablet take 1 tablet by mouth daily 90 tablet 1  . Cholecalciferol (VITAMIN D3 PO) Take 125 mcg by mouth daily.    . Ferrous Gluconate-C-Folic Acid (IRON-C PO) Take by mouth.    . Glucosamine 500 MG TABS Take by mouth.      Marland Kitchen lisinopril (ZESTRIL) 40 MG tablet TAKE ONE TABLET BY MOUTH ONE TIME DAILY 90 tablet 2  . metFORMIN (GLUCOPHAGE-XR) 500 MG 24 hr tablet TAKE 3 TABLETS BY MOUTH DAILY WITH BREAKFAST  270 tablet 0  . metoprolol succinate (TOPROL-XL) 50 MG 24 hr tablet TAKE ONE HALF TABLET BY MOUTH ONCE DAILY 90 tablet 0  . Multiple Vitamin (MULTIVITAMIN) capsule Take 1 capsule by mouth daily.      . Saw Palmetto 450 MG CAPS Take 2 capsules by mouth daily.     No current facility-administered medications on file prior to visit.   Review of Systems All otherwise neg per pt     Objective:   Physical Exam BP Marland Kitchen)  142/90 (BP Location: Left Arm, Patient Position: Sitting, Cuff Size: Large)   Pulse 62   Temp 97.9 F (36.6 C) (Oral)   Ht 5\' 10"  (1.778 m)   Wt 177 lb (80.3 kg)   SpO2 97%   BMI 25.40 kg/m  VS noted,  Constitutional: Pt appears in NAD HENT: Head: NCAT.  Right Ear: External ear normal.  Left Ear: External ear normal.  Eyes: . Pupils are equal, round, and reactive to light. Conjunctivae and EOM are normal Nose: without d/c or deformity Neck: Neck supple. Gross normal ROM Cardiovascular: Normal rate and regular rhythm.   Pulmonary/Chest: Effort normal and breath sounds without rales or wheezing.  Abd:  Soft, NT, ND, + BS, no  organomegaly Neurological: Pt is alert. At baseline orientation, motor grossly intact Skin: Skin is warm. No rashes, other new lesions, no LE edema Psychiatric: Pt behavior is normal without agitation  All otherwise neg per pt Lab Results  Component Value Date   WBC 5.9 06/01/2020   HGB 14.5 06/01/2020   HCT 43.5 06/01/2020   PLT 247 06/01/2020   GLUCOSE 125 (H) 06/01/2020   CHOL 183 06/01/2020   TRIG 139 06/01/2020   HDL 54 06/01/2020   LDLDIRECT 139.0 02/10/2011   LDLCALC 105 (H) 06/01/2020   ALT 12 06/01/2020   AST 20 06/01/2020   NA 138 06/01/2020   K 4.1 06/01/2020   CL 103 06/01/2020   CREATININE 0.80 06/01/2020   BUN 12 06/01/2020   CO2 24 06/01/2020   TSH 1.56 06/01/2020   PSA 2.6 06/01/2020   HGBA1C 6.4 (H) 06/01/2020   MICROALBUR 6.8 06/01/2020      Assessment & Plan:

## 2020-06-02 ENCOUNTER — Encounter: Payer: Self-pay | Admitting: Internal Medicine

## 2020-06-02 LAB — URINALYSIS, ROUTINE W REFLEX MICROSCOPIC
Bacteria, UA: NONE SEEN /HPF
Bilirubin Urine: NEGATIVE
Glucose, UA: NEGATIVE
Hgb urine dipstick: NEGATIVE
Hyaline Cast: NONE SEEN /LPF
Ketones, ur: NEGATIVE
Leukocytes,Ua: NEGATIVE
Nitrite: NEGATIVE
RBC / HPF: NONE SEEN /HPF (ref 0–2)
Specific Gravity, Urine: 1.022 (ref 1.001–1.03)
Squamous Epithelial / HPF: NONE SEEN /HPF (ref <=5)
WBC, UA: NONE SEEN /HPF (ref 0–5)
pH: 6 (ref 5.0–8.0)

## 2020-06-02 LAB — LIPID PANEL
Cholesterol: 183 mg/dL (ref <200)
HDL: 54 mg/dL (ref >=40)
LDL Cholesterol (Calc): 105 mg/dL (calc) — ABNORMAL HIGH
Non-HDL Cholesterol (Calc): 129 mg/dL (calc) (ref <130)
Total CHOL/HDL Ratio: 3.4 (calc) (ref <5.0)
Triglycerides: 139 mg/dL (ref <150)

## 2020-06-02 LAB — COMPLETE METABOLIC PANEL WITH GFR
AG Ratio: 1.4 (calc) (ref 1.0–2.5)
ALT: 12 U/L (ref 9–46)
AST: 20 U/L (ref 10–35)
Albumin: 4.2 g/dL (ref 3.6–5.1)
Alkaline phosphatase (APISO): 66 U/L (ref 35–144)
BUN: 12 mg/dL (ref 7–25)
CO2: 24 mmol/L (ref 20–32)
Calcium: 9.5 mg/dL (ref 8.6–10.3)
Chloride: 103 mmol/L (ref 98–110)
Creat: 0.8 mg/dL (ref 0.70–1.18)
GFR, Est African American: 101 mL/min/{1.73_m2} (ref >=60)
GFR, Est Non African American: 87 mL/min/{1.73_m2} (ref >=60)
Globulin: 3 g/dL (calc) (ref 1.9–3.7)
Glucose, Bld: 125 mg/dL — ABNORMAL HIGH (ref 65–99)
Potassium: 4.1 mmol/L (ref 3.5–5.3)
Sodium: 138 mmol/L (ref 135–146)
Total Bilirubin: 0.5 mg/dL (ref 0.2–1.2)
Total Protein: 7.2 g/dL (ref 6.1–8.1)

## 2020-06-02 LAB — HEPATITIS C ANTIBODY
Hepatitis C Ab: NONREACTIVE
SIGNAL TO CUT-OFF: 0.01 (ref <1.00)

## 2020-06-02 LAB — CBC WITH DIFFERENTIAL/PLATELET
Absolute Monocytes: 637 cells/uL (ref 200–950)
Basophils Absolute: 59 cells/uL (ref 0–200)
Basophils Relative: 1 %
Eosinophils Absolute: 118 cells/uL (ref 15–500)
Eosinophils Relative: 2 %
HCT: 43.5 % (ref 38.5–50.0)
Hemoglobin: 14.5 g/dL (ref 13.2–17.1)
Lymphs Abs: 1581 cells/uL (ref 850–3900)
MCH: 30.4 pg (ref 27.0–33.0)
MCHC: 33.3 g/dL (ref 32.0–36.0)
MCV: 91.2 fL (ref 80.0–100.0)
MPV: 9.1 fL (ref 7.5–12.5)
Monocytes Relative: 10.8 %
Neutro Abs: 3505 cells/uL (ref 1500–7800)
Neutrophils Relative %: 59.4 %
Platelets: 247 10*3/uL (ref 140–400)
RBC: 4.77 10*6/uL (ref 4.20–5.80)
RDW: 12.2 % (ref 11.0–15.0)
Total Lymphocyte: 26.8 %
WBC: 5.9 10*3/uL (ref 3.8–10.8)

## 2020-06-02 LAB — MICROALBUMIN / CREATININE URINE RATIO
Creatinine, Urine: 161 mg/dL (ref 20–320)
Microalb Creat Ratio: 42 mcg/mg creat — ABNORMAL HIGH (ref <30)
Microalb, Ur: 6.8 mg/dL

## 2020-06-02 LAB — TSH: TSH: 1.56 mIU/L (ref 0.40–4.50)

## 2020-06-02 LAB — HEMOGLOBIN A1C
Hgb A1c MFr Bld: 6.4 % of total Hgb — ABNORMAL HIGH (ref <5.7)
Mean Plasma Glucose: 137 (calc)
eAG (mmol/L): 7.6 (calc)

## 2020-06-02 LAB — PSA: PSA: 2.6 ng/mL (ref <=4.0)

## 2020-06-02 NOTE — Assessment & Plan Note (Addendum)
stable overall by history and exam, recent data reviewed with pt, and pt to continue medical treatment as before,  to f/u any worsening symptoms or concerns  I spent 31 minutes in preparing to see the patient by review of recent labs, imaging and procedures, obtaining and reviewing separately obtained history, communicating with the patient and family or caregiver, ordering medications, tests or procedures, and documenting clinical information in the EHR including the differential Dx, treatment, and any further evaluation and other management of dm, htn, hld, elev psa

## 2020-06-02 NOTE — Assessment & Plan Note (Signed)
stable overall by history and exam, recent data reviewed with pt, and pt to continue medical treatment as before,  to f/u any worsening symptoms or concerns  

## 2020-06-02 NOTE — Assessment & Plan Note (Signed)
For fu psa 

## 2020-06-29 ENCOUNTER — Other Ambulatory Visit: Payer: Self-pay | Admitting: Internal Medicine

## 2020-06-29 NOTE — Telephone Encounter (Signed)
Please refill as per office routine med refill policy (all routine meds refilled for 3 mo or monthly per pt preference up to one year from last visit, then month to month grace period for 3 mo, then further med refills will have to be denied)  

## 2020-08-06 ENCOUNTER — Other Ambulatory Visit: Payer: Self-pay | Admitting: Internal Medicine

## 2020-08-06 NOTE — Telephone Encounter (Signed)
Please refill as per office routine med refill policy (all routine meds refilled for 3 mo or monthly per pt preference up to one year from last visit, then month to month grace period for 3 mo, then further med refills will have to be denied)  

## 2020-09-17 ENCOUNTER — Other Ambulatory Visit: Payer: Self-pay | Admitting: Internal Medicine

## 2020-09-17 NOTE — Telephone Encounter (Signed)
Ok done erx  [facility] to let pt know, the [facility] policy for narcotic have changed so I will need to call  at some point to explain about signing a [facility] pain contract and having urinary drug screen done

## 2020-11-13 ENCOUNTER — Other Ambulatory Visit: Payer: Self-pay | Admitting: Internal Medicine

## 2020-11-13 NOTE — Telephone Encounter (Signed)
Please refill as per office routine med refill policy (all routine meds refilled for 3 mo or monthly per pt preference up to one year from last visit, then month to month grace period for 3 mo, then further med refills will have to be denied)  

## 2020-12-02 ENCOUNTER — Other Ambulatory Visit: Payer: Self-pay | Admitting: Internal Medicine

## 2020-12-02 NOTE — Telephone Encounter (Signed)
Done erx  pmpaware reviewed and no diversion or misuse suspected

## 2020-12-04 ENCOUNTER — Other Ambulatory Visit: Payer: Self-pay | Admitting: Internal Medicine

## 2020-12-05 NOTE — Telephone Encounter (Signed)
Please refill as per office routine med refill policy (all routine meds refilled for 3 mo or monthly per pt preference up to one year from last visit, then month to month grace period for 3 mo, then further med refills will have to be denied)  

## 2021-01-06 ENCOUNTER — Other Ambulatory Visit: Payer: Self-pay | Admitting: Internal Medicine

## 2021-01-06 NOTE — Telephone Encounter (Signed)
Please refill as per office routine med refill policy (all routine meds refilled for 3 mo or monthly per pt preference up to one year from last visit, then month to month grace period for 3 mo, then further med refills will have to be denied)  

## 2021-01-09 ENCOUNTER — Encounter: Payer: Self-pay | Admitting: Internal Medicine

## 2021-01-21 ENCOUNTER — Encounter: Payer: Self-pay | Admitting: Internal Medicine

## 2021-01-21 ENCOUNTER — Other Ambulatory Visit: Payer: Self-pay

## 2021-01-21 ENCOUNTER — Ambulatory Visit (INDEPENDENT_AMBULATORY_CARE_PROVIDER_SITE_OTHER): Payer: Medicare Other | Admitting: Internal Medicine

## 2021-01-21 VITALS — BP 158/80 | HR 61 | Temp 98.3°F | Ht 70.0 in | Wt 177.0 lb

## 2021-01-21 DIAGNOSIS — E1165 Type 2 diabetes mellitus with hyperglycemia: Secondary | ICD-10-CM | POA: Diagnosis not present

## 2021-01-21 DIAGNOSIS — I10 Essential (primary) hypertension: Secondary | ICD-10-CM | POA: Diagnosis not present

## 2021-01-21 DIAGNOSIS — M542 Cervicalgia: Secondary | ICD-10-CM

## 2021-01-21 DIAGNOSIS — E538 Deficiency of other specified B group vitamins: Secondary | ICD-10-CM | POA: Diagnosis not present

## 2021-01-21 DIAGNOSIS — I350 Nonrheumatic aortic (valve) stenosis: Secondary | ICD-10-CM | POA: Diagnosis not present

## 2021-01-21 DIAGNOSIS — G8929 Other chronic pain: Secondary | ICD-10-CM

## 2021-01-21 DIAGNOSIS — E559 Vitamin D deficiency, unspecified: Secondary | ICD-10-CM | POA: Diagnosis not present

## 2021-01-21 DIAGNOSIS — E7800 Pure hypercholesterolemia, unspecified: Secondary | ICD-10-CM

## 2021-01-21 LAB — HEMOGLOBIN A1C: Hgb A1c MFr Bld: 6.5 % (ref 4.6–6.5)

## 2021-01-21 LAB — BASIC METABOLIC PANEL
BUN: 14 mg/dL (ref 6–23)
CO2: 26 mEq/L (ref 19–32)
Calcium: 9.4 mg/dL (ref 8.4–10.5)
Chloride: 103 mEq/L (ref 96–112)
Creatinine, Ser: 0.82 mg/dL (ref 0.40–1.50)
GFR: 84.85 mL/min (ref >60.00)
Glucose, Bld: 130 mg/dL — ABNORMAL HIGH (ref 70–99)
Potassium: 4.2 mEq/L (ref 3.5–5.1)
Sodium: 137 mEq/L (ref 135–145)

## 2021-01-21 LAB — HEPATIC FUNCTION PANEL
ALT: 13 U/L (ref 0–53)
AST: 19 U/L (ref 0–37)
Albumin: 4.4 g/dL (ref 3.5–5.2)
Alkaline Phosphatase: 59 U/L (ref 39–117)
Bilirubin, Direct: 0.2 mg/dL (ref 0.0–0.3)
Total Bilirubin: 0.9 mg/dL (ref 0.2–1.2)
Total Protein: 7.3 g/dL (ref 6.0–8.3)

## 2021-01-21 LAB — LIPID PANEL
Cholesterol: 181 mg/dL (ref 0–200)
HDL: 48.5 mg/dL (ref >39.00)
LDL Cholesterol: 114 mg/dL — ABNORMAL HIGH (ref 0–99)
NonHDL: 132.21
Total CHOL/HDL Ratio: 4
Triglycerides: 93 mg/dL (ref 0.0–149.0)
VLDL: 18.6 mg/dL (ref 0.0–40.0)

## 2021-01-21 LAB — VITAMIN B12: Vitamin B-12: 281 pg/mL (ref 211–911)

## 2021-01-21 LAB — VITAMIN D 25 HYDROXY (VIT D DEFICIENCY, FRACTURES): VITD: 67.42 ng/mL (ref 30.00–100.00)

## 2021-01-21 NOTE — Assessment & Plan Note (Signed)
For pain med refill , also refer pain management

## 2021-01-21 NOTE — Progress Notes (Signed)
Patient ID: David Bolton, male   DOB: 1940/12/13, 80 y.o.   MRN: [id number]         Chief Complaint:: yearly exam       HPI:  David Bolton is a 80 y.o. male here for yearly exam ; overall doing well without specific complaint except for ongoing persistent moderate daily constant neck and bilateral upper back and shoulders pain, worse to sleep and cannot sleep at all without his hydrocodone qhs.  Already had one surgury, does not want another.  Pt denies chest pain, increased sob or doe, wheezing, orthopnea, PND, increased LE swelling, palpitations, dizziness or syncope.   Pt denies polydipsia, polyuria, Denies new worsening neuro s/s.   Pt denies fever, wt loss, night sweats, loss of appetite, or other constitutional symptoms  Last echo 2020 with mod AS, not following with cardiology.  BP at home has been < 140/90 but not checked recently.      Wt Readings from Last 3 Encounters:  01/21/21 177 lb (80.3 kg)  06/01/20 177 lb (80.3 kg)  12/02/19 175 lb (79.4 kg)   BP Readings from Last 3 Encounters:  01/21/21 (!) 158/80  06/01/20 (!) 142/90  12/02/19 (!) 144/80   Immunization History  Administered Date(s) Administered  . Influenza Split 08/14/2011, 08/02/2012  . Influenza Whole 09/16/2004, 08/04/2008, 08/12/2010  . Influenza, High Dose Seasonal PF 08/19/2015, 08/01/2017, 08/08/2018  . Influenza,inj,Quad PF,6+ Mos 08/06/2013, 08/12/2014, 08/14/2019  . PFIZER(Purple Top)SARS-COV-2 Vaccination 12/21/2019, 01/13/2020  . Pneumococcal Conjugate-13 08/06/2013  . Pneumococcal Polysaccharide-23 09/10/2008, 07/11/2016  . Td 10/30/1993, 09/10/2008  . Tdap 06/01/2020   There are no preventive care reminders to display for this patient.  Past Medical History:  Diagnosis Date  . ALLERGIC RHINITIS 06/08/2007  . ANXIETY 06/08/2007  . Cervicalgia 12/05/2007  . COLONIC POLYPS, HX OF 06/08/2007  . Cough 08/12/2010  . DEGENERATION, CERVICAL DISC 07/30/2007  . DEPENDENCE, ALCOHOL NEC/NOS, IN REMISSION  06/08/2007  . DEPRESSION 06/08/2007  . Diabetes mellitus without complication ([place])   . DIVERTICULOSIS, COLON 06/08/2007  . EPIGASTRIC TENDERNESS 12/05/2007  . FATIGUE 09/10/2008  . GERD 06/08/2007  . Headache(784.0) 06/08/2007  . HYPERLIPIDEMIA 06/08/2007  . HYPERTENSION 06/08/2007  . IBS 06/08/2007  . Impaired glucose tolerance 02/10/2011  . LOW BACK PAIN, CHRONIC 08/12/2010  . Nocturia 07/16/2009  . PILONIDAL CYST 06/08/2007  . PLANTAR FASCIITIS 06/08/2007   Past Surgical History:  Procedure Laterality Date  . bladder and prostate surgury    . CERVICAL FUSION    . COLONOSCOPY  last colon 11/03/2008   w/Brodie=normal exam  . pilonidal cyst surgury      reports that he has never smoked. He has never used smokeless tobacco. He reports that he does not drink alcohol and does not use drugs. family history includes Heart attack in his paternal aunt; Heart disease in his mother; Kidney disease in an other family member. Allergies  Allergen Reactions  . Sulfa Antibiotics Rash    Breaks out around private parts  . Doxycycline Hyclate   . Peanut-Containing Drug Products     Nose bleed, headache  . Sulfonamide Derivatives    Current Outpatient Medications on File Prior to Visit  Medication Sig Dispense Refill  . amLODipine (NORVASC) 5 MG tablet TAKE ONE TABLET BY MOUTH ONE TIME DAILY 90 tablet 0  . aspirin EC 81 MG EC tablet Take 81 mg by mouth daily.    Marland Kitchen atorvastatin (LIPITOR) 20 MG tablet take 1 tablet by mouth daily 90 tablet  1  . Cholecalciferol (VITAMIN D3 PO) Take 125 mcg by mouth daily.    . Ferrous Gluconate-C-Folic Acid (IRON-C PO) Take by mouth.    . Glucosamine 500 MG TABS Take by mouth.    Marland Kitchen HYDROcodone-acetaminophen (NORCO) 10-325 MG tablet Take one-half to one tablet by mouth at bedtime as needed 30 tablet 0  . lisinopril (ZESTRIL) 40 MG tablet TAKE ONE TABLET BY MOUTH ONE TIME DAILY 90 tablet 2  . metFORMIN (GLUCOPHAGE-XR) 500 MG 24 hr tablet take 3 tablets by mouth once a day with  breakfast 270 tablet 0  . metoprolol succinate (TOPROL-XL) 50 MG 24 hr tablet Take one-half tablet by mouth daily 30 tablet 0  . Multiple Vitamin (MULTIVITAMIN) capsule Take 1 capsule by mouth daily.    . Saw Palmetto 450 MG CAPS Take 2 capsules by mouth daily.     No current facility-administered medications on file prior to visit.        ROS:  All others reviewed and negative.  Objective        PE:  BP (!) 158/80   Pulse 61   Temp 98.3 F (36.8 C) (Oral)   Ht 5\' 10"  (1.778 m)   Wt 177 lb (80.3 kg)   SpO2 98%   BMI 25.40 kg/m                 Constitutional: Pt appears in NAD               HENT: Head: NCAT.                Right Ear: External ear normal.                 Left Ear: External ear normal.                Eyes: . Pupils are equal, round, and reactive to light. Conjunctivae and EOM are normal               Nose: without d/c or deformity               Neck: Neck supple. Gross normal ROM               Cardiovascular: Normal rate and regular rhythm.  With loud gr 2-3/6 sys murmur LUSB               Pulmonary/Chest: Effort normal and breath sounds without rales or wheezing.                Abd:  Soft, NT, ND, + BS, no organomegaly               Neurological: Pt is alert. At baseline orientation, motor grossly intact               Skin: Skin is warm. No rashes, no other new lesions, LE edema - none               Psychiatric: Pt behavior is normal without agitation   Micro: none  Cardiac tracings I have personally interpreted today:  none  Pertinent Radiological findings (summarize): none   Lab Results  Component Value Date   WBC 5.9 06/01/2020   HGB 14.5 06/01/2020   HCT 43.5 06/01/2020   PLT 247 06/01/2020   GLUCOSE 130 (H) 01/21/2021   CHOL 181 01/21/2021   TRIG 93.0 01/21/2021   HDL 48.50 01/21/2021   LDLDIRECT 139.0 02/10/2011   LDLCALC 114 (H) 01/21/2021  ALT 13 01/21/2021   AST 19 01/21/2021   NA 137 01/21/2021   K 4.2 01/21/2021   CL 103 01/21/2021    CREATININE 0.82 01/21/2021   BUN 14 01/21/2021   CO2 26 01/21/2021   TSH 1.56 06/01/2020   PSA 2.6 06/01/2020   HGBA1C 6.5 01/21/2021   MICROALBUR 6.8 06/01/2020   Assessment/Plan:  David Bolton is a 80 y.o. White or Caucasian [1] male with  has a past medical history of ALLERGIC RHINITIS (06/08/2007), ANXIETY (06/08/2007), Cervicalgia (12/05/2007), COLONIC POLYPS, HX OF (06/08/2007), Cough (08/12/2010), DEGENERATION, CERVICAL DISC (07/30/2007), DEPENDENCE, ALCOHOL NEC/NOS, IN REMISSION (06/08/2007), DEPRESSION (06/08/2007), Diabetes mellitus without complication ([place]), DIVERTICULOSIS, COLON (06/08/2007), EPIGASTRIC TENDERNESS (12/05/2007), FATIGUE (09/10/2008), GERD (06/08/2007), Headache(784.0) (06/08/2007), HYPERLIPIDEMIA (06/08/2007), HYPERTENSION (06/08/2007), IBS (06/08/2007), Impaired glucose tolerance (02/10/2011), LOW BACK PAIN, CHRONIC (08/12/2010), Nocturia (07/16/2009), PILONIDAL CYST (06/08/2007), and PLANTAR FASCIITIS (06/08/2007).  Chronic neck pain For pain med refill , also refer pain management  Moderate aortic stenosis Asympt, will need to establish with cardiology, will refer  Diabetes Lab Results  Component Value Date   HGBA1C 6.5 01/21/2021   Stable, pt to continue current medical treatment metformin  Current Outpatient Medications (Endocrine & Metabolic):  .  metFORMIN (GLUCOPHAGE-XR) 500 MG 24 hr tablet, take 3 tablets by mouth once a day with breakfast  Current Outpatient Medications (Cardiovascular):  .  amLODipine (NORVASC) 5 MG tablet, TAKE ONE TABLET BY MOUTH ONE TIME DAILY .  atorvastatin (LIPITOR) 20 MG tablet, take 1 tablet by mouth daily .  lisinopril (ZESTRIL) 40 MG tablet, TAKE ONE TABLET BY MOUTH ONE TIME DAILY .  metoprolol succinate (TOPROL-XL) 50 MG 24 hr tablet, Take one-half tablet by mouth daily   Current Outpatient Medications (Analgesics):  .  aspirin EC 81 MG EC tablet, Take 81 mg by mouth daily. Marland Kitchen  HYDROcodone-acetaminophen (NORCO) 10-325 MG tablet, Take  one-half to one tablet by mouth at bedtime as needed  Current Outpatient Medications (Hematological):  Marland Kitchen  Ferrous Gluconate-C-Folic Acid (IRON-C PO), Take by mouth.  Current Outpatient Medications (Other):  Marland Kitchen  Cholecalciferol (VITAMIN D3 PO), Take 125 mcg by mouth daily. .  Glucosamine 500 MG TABS, Take by mouth. .  Multiple Vitamin (MULTIVITAMIN) capsule, Take 1 capsule by mouth daily. .  Saw Palmetto 450 MG CAPS, Take 2 capsules by mouth daily.   Essential hypertension BP Readings from Last 3 Encounters:  01/21/21 (!) 158/80  06/01/20 (!) 142/90  12/02/19 (!) 144/80   Consistently elevated here, pt states controlled at home, will ask him to recheck, pt to continue medical treatment  - toprol, lisinopril, amlodipine   Hyperlipidemia  pt to continue current statin lipitor 20 and f/u lipid lab today   Followup: Return in about 6 months (around 07/24/2021).  Cathlean Cower, MD 01/22/2021 9:21 PM [facility]

## 2021-01-21 NOTE — Patient Instructions (Addendum)
Your Mychart ID is [id number], and you made the new Password today  Please check your BP at home daily for 10 days, and let us know the average after 10 days; with the goal being to be at least less than 140/90  Please continue all other medications as before, and refills have been done if requested - the pain medication  Please have the pharmacy call with any other refills you may need.  Please continue your efforts at being more active, low cholesterol diet, and weight control.  You are otherwise up to date with prevention measures today.  Please keep your appointments with your specialists as you may have planned  You will be contacted regarding the referral for: cardiology, and pain management clinic  Please go to the LAB at the blood drawing area for the tests to be done  You will be contacted by phone if any changes need to be made immediately.  Otherwise, you will receive a letter about your results with an explanation, but please check with MyChart first.  Please remember to sign up for MyChart if you have not done so, as this will be important to you in the future with finding out test results, communicating by private email, and scheduling acute appointments online when needed.  Please make an Appointment to return in 6 months, or sooner if needed

## 2021-01-22 ENCOUNTER — Encounter: Payer: Self-pay | Admitting: Internal Medicine

## 2021-01-22 NOTE — Telephone Encounter (Signed)
-----   Message from Biagio Borg, MD sent at 10/11/2020  3:09 PM EST ----- Regarding: needs UDS and pain contract

## 2021-01-22 NOTE — Assessment & Plan Note (Signed)
Asympt, will need to establish with cardiology, will refer

## 2021-01-22 NOTE — Assessment & Plan Note (Signed)
BP Readings from Last 3 Encounters:  01/21/21 (!) 158/80  06/01/20 (!) 142/90  12/02/19 (!) 144/80   Consistently elevated here, pt states controlled at home, will ask him to recheck, pt to continue medical treatment  - toprol, lisinopril, amlodipine

## 2021-01-22 NOTE — Assessment & Plan Note (Signed)
pt to continue current statin lipitor 20 and f/u lipid lab today

## 2021-01-22 NOTE — Assessment & Plan Note (Signed)
Lab Results  Component Value Date   HGBA1C 6.5 01/21/2021   Stable, pt to continue current medical treatment metformin  Current Outpatient Medications (Endocrine & Metabolic):  .  metFORMIN (GLUCOPHAGE-XR) 500 MG 24 hr tablet, take 3 tablets by mouth once a day with breakfast  Current Outpatient Medications (Cardiovascular):  .  amLODipine (NORVASC) 5 MG tablet, TAKE ONE TABLET BY MOUTH ONE TIME DAILY .  atorvastatin (LIPITOR) 20 MG tablet, take 1 tablet by mouth daily .  lisinopril (ZESTRIL) 40 MG tablet, TAKE ONE TABLET BY MOUTH ONE TIME DAILY .  metoprolol succinate (TOPROL-XL) 50 MG 24 hr tablet, Take one-half tablet by mouth daily   Current Outpatient Medications (Analgesics):  .  aspirin EC 81 MG EC tablet, Take 81 mg by mouth daily. Marland Kitchen  HYDROcodone-acetaminophen (NORCO) 10-325 MG tablet, Take one-half to one tablet by mouth at bedtime as needed  Current Outpatient Medications (Hematological):  Marland Kitchen  Ferrous Gluconate-C-Folic Acid (IRON-C PO), Take by mouth.  Current Outpatient Medications (Other):  Marland Kitchen  Cholecalciferol (VITAMIN D3 PO), Take 125 mcg by mouth daily. .  Glucosamine 500 MG TABS, Take by mouth. .  Multiple Vitamin (MULTIVITAMIN) capsule, Take 1 capsule by mouth daily. .  Saw Palmetto 450 MG CAPS, Take 2 capsules by mouth daily.

## 2021-01-24 ENCOUNTER — Other Ambulatory Visit: Payer: Self-pay | Admitting: Internal Medicine

## 2021-01-24 NOTE — Telephone Encounter (Signed)
Please refill as per office routine med refill policy (all routine meds refilled for 3 mo or monthly per pt preference up to one year from last visit, then month to month grace period for 3 mo, then further med refills will have to be denied)  

## 2021-01-26 ENCOUNTER — Other Ambulatory Visit: Payer: Self-pay | Admitting: Internal Medicine

## 2021-02-11 ENCOUNTER — Other Ambulatory Visit: Payer: Self-pay | Admitting: Internal Medicine

## 2021-02-11 NOTE — Telephone Encounter (Signed)
Please refill as per office routine med refill policy (all routine meds refilled for 3 mo or monthly per pt preference up to one year from last visit, then month to month grace period for 3 mo, then further med refills will have to be denied)  

## 2021-03-03 ENCOUNTER — Ambulatory Visit: Payer: Medicare Other

## 2021-03-12 ENCOUNTER — Other Ambulatory Visit: Payer: Self-pay | Admitting: Internal Medicine

## 2021-03-12 NOTE — Telephone Encounter (Signed)
Please refill as per office routine med refill policy (all routine meds refilled for 3 mo or monthly per pt preference up to one year from last visit, then month to month grace period for 3 mo, then further med refills will have to be denied)  

## 2021-03-14 ENCOUNTER — Other Ambulatory Visit: Payer: Self-pay

## 2021-03-14 ENCOUNTER — Ambulatory Visit (INDEPENDENT_AMBULATORY_CARE_PROVIDER_SITE_OTHER): Payer: Medicare Other

## 2021-03-14 VITALS — BP 127/80 | HR 67 | Temp 98.3°F | Ht 70.0 in | Wt 178.6 lb

## 2021-03-14 DIAGNOSIS — Z0000 Encounter for general adult medical examination without abnormal findings: Secondary | ICD-10-CM

## 2021-03-14 NOTE — Patient Instructions (Signed)
David Bolton , Thank you for taking time to come for your Medicare Wellness Visit. I appreciate your ongoing commitment to your health goals. Please review the following plan we discussed and let me know if I can assist you in the future.   Screening recommendations/referrals: Colonoscopy: 12/26/2018  Recommended yearly ophthalmology/optometry visit for glaucoma screening and checkup Recommended yearly dental visit for hygiene and checkup  Vaccinations: Influenza vaccine: 06/2020 Pneumococcal vaccine: 08/06/2013, 07/11/2016 Tdap vaccine: 06/01/2020 Shingles vaccine: never done; can check with local pharmacy   Covid-19: 2/21/201, 01/13/2020, with booster  Advanced directives: Please bring a copy of your health care power of attorney and living will to the office at your convenience.  Conditions/risks identified: Yes; Reviewed health maintenance screenings with patient today and relevant education, vaccines, and/or referrals were provided. Please continue to do your personal lifestyle choices by: daily care of teeth and gums, regular physical activity (goal should be 5 days a week for 30 minutes), eat a healthy diet, avoid tobacco and drug use, limiting any alcohol intake, taking a low-dose aspirin (if not allergic or have been advised by your provider otherwise) and taking vitamins and minerals as recommended by your provider. Continue doing brain stimulating activities (puzzles, reading, adult coloring books, staying active) to keep memory sharp. Continue to eat heart healthy diet (full of fruits, vegetables, whole grains, lean protein, water--limit salt, fat, and sugar intake) and increase physical activity as tolerated.  Next appointment: Please schedule your next Medicare Wellness Visit with your Nurse Health Advisor in 1 year by calling [phone].  Preventive Care 78 Years and Older, Male Preventive care refers to lifestyle choices and visits with your health care provider that can promote  health and wellness. What does preventive care include?  A yearly physical exam. This is also called an annual well check.  Dental exams once or twice a year.  Routine eye exams. Ask your health care provider how often you should have your eyes checked.  Personal lifestyle choices, including:  Daily care of your teeth and gums.  Regular physical activity.  Eating a healthy diet.  Avoiding tobacco and drug use.  Limiting alcohol use.  Practicing safe sex.  Taking low doses of aspirin every day.  Taking vitamin and mineral supplements as recommended by your health care provider. What happens during an annual well check? The services and screenings done by your health care provider during your annual well check will depend on your age, overall health, lifestyle risk factors, and family history of disease. Counseling  Your health care provider may ask you questions about your:  Alcohol use.  Tobacco use.  Drug use.  Emotional well-being.  Home and relationship well-being.  Sexual activity.  Eating habits.  History of falls.  Memory and ability to understand (cognition).  Work and work Statistician. Screening  You may have the following tests or measurements:  Height, weight, and BMI.  Blood pressure.  Lipid and cholesterol levels. These may be checked every 5 years, or more frequently if you are over 61 years old.  Skin check.  Lung cancer screening. You may have this screening every year starting at age 47 if you have a 30-pack-year history of smoking and currently smoke or have quit within the past 15 years.  Fecal occult blood test (FOBT) of the stool. You may have this test every year starting at age 65.  Flexible sigmoidoscopy or colonoscopy. You may have a sigmoidoscopy every 5 years or a colonoscopy every 10 years starting at  age 35.  Prostate cancer screening. Recommendations will vary depending on your family history and other risks.  Hepatitis  C blood test.  Hepatitis B blood test.  Sexually transmitted disease (STD) testing.  Diabetes screening. This is done by checking your blood sugar (glucose) after you have not eaten for a while (fasting). You may have this done every 1-3 years.  Abdominal aortic aneurysm (AAA) screening. You may need this if you are a current or former smoker.  Osteoporosis. You may be screened starting at age 47 if you are at high risk. Talk with your health care provider about your test results, treatment options, and if necessary, the need for more tests. Vaccines  Your health care provider may recommend certain vaccines, such as:  Influenza vaccine. This is recommended every year.  Tetanus, diphtheria, and acellular pertussis (Tdap, Td) vaccine. You may need a Td booster every 10 years.  Zoster vaccine. You may need this after age 69.  Pneumococcal 13-valent conjugate (PCV13) vaccine. One dose is recommended after age 63.  Pneumococcal polysaccharide (PPSV23) vaccine. One dose is recommended after age 37. Talk to your health care provider about which screenings and vaccines you need and how often you need them. This information is not intended to replace advice given to you by your health care provider. Make sure you discuss any questions you have with your health care provider. Document Released: 11/12/2015 Document Revised: 07/05/2016 Document Reviewed: 08/17/2015 Elsevier Interactive Patient Education  2017 [employer] Prevention in the Home Falls can cause injuries. They can happen to people of all ages. There are many things you can do to make your home safe and to help prevent falls. What can I do on the outside of my home?  Regularly fix the edges of walkways and driveways and fix any cracks.  Remove anything that might make you trip as you walk through a door, such as a raised step or threshold.  Trim any bushes or trees on the path to your home.  Use bright outdoor  lighting.  Clear any walking paths of anything that might make someone trip, such as rocks or tools.  Regularly check to see if handrails are loose or broken. Make sure that both sides of any steps have handrails.  Any raised decks and porches should have guardrails on the edges.  Have any leaves, snow, or ice cleared regularly.  Use sand or salt on walking paths during winter.  Clean up any spills in your garage right away. This includes oil or grease spills. What can I do in the bathroom?  Use night lights.  Install grab bars by the toilet and in the tub and shower. Do not use towel bars as grab bars.  Use non-skid mats or decals in the tub or shower.  If you need to sit down in the shower, use a plastic, non-slip stool.  Keep the floor dry. Clean up any water that spills on the floor as soon as it happens.  Remove soap buildup in the tub or shower regularly.  Attach bath mats securely with double-sided non-slip rug tape.  Do not have throw rugs and other things on the floor that can make you trip. What can I do in the bedroom?  Use night lights.  Make sure that you have a light by your bed that is easy to reach.  Do not use any sheets or blankets that are too big for your bed. They should not hang down onto the  floor.  Have a firm chair that has side arms. You can use this for support while you get dressed.  Do not have throw rugs and other things on the floor that can make you trip. What can I do in the kitchen?  Clean up any spills right away.  Avoid walking on wet floors.  Keep items that you use a lot in easy-to-reach places.  If you need to reach something above you, use a strong step stool that has a grab bar.  Keep electrical cords out of the way.  Do not use floor polish or wax that makes floors slippery. If you must use wax, use non-skid floor wax.  Do not have throw rugs and other things on the floor that can make you trip. What can I do with my  stairs?  Do not leave any items on the stairs.  Make sure that there are handrails on both sides of the stairs and use them. Fix handrails that are broken or loose. Make sure that handrails are as long as the stairways.  Check any carpeting to make sure that it is firmly attached to the stairs. Fix any carpet that is loose or worn.  Avoid having throw rugs at the top or bottom of the stairs. If you do have throw rugs, attach them to the floor with carpet tape.  Make sure that you have a light switch at the top of the stairs and the bottom of the stairs. If you do not have them, ask someone to add them for you. What else can I do to help prevent falls?  Wear shoes that:  Do not have high heels.  Have rubber bottoms.  Are comfortable and fit you well.  Are closed at the toe. Do not wear sandals.  If you use a stepladder:  Make sure that it is fully opened. Do not climb a closed stepladder.  Make sure that both sides of the stepladder are locked into place.  Ask someone to hold it for you, if possible.  Clearly mark and make sure that you can see:  Any grab bars or handrails.  First and last steps.  Where the edge of each step is.  Use tools that help you move around (mobility aids) if they are needed. These include:  Canes.  Walkers.  Scooters.  Crutches.  Turn on the lights when you go into a dark area. Replace any light bulbs as soon as they burn out.  Set up your furniture so you have a clear path. Avoid moving your furniture around.  If any of your floors are uneven, fix them.  If there are any pets around you, be aware of where they are.  Review your medicines with your doctor. Some medicines can make you feel dizzy. This can increase your chance of falling. Ask your doctor what other things that you can do to help prevent falls. This information is not intended to replace advice given to you by your health care provider. Make sure you discuss any  questions you have with your health care provider. Document Released: 08/12/2009 Document Revised: 03/23/2016 Document Reviewed: 11/20/2014 Elsevier Interactive Patient Education  2017 [employer].

## 2021-03-14 NOTE — Progress Notes (Signed)
Subjective:   David Bolton is a 80 y.o. male who presents for Medicare Annual/Subsequent preventive examination.  Review of Systems    No ROS. Medicare Wellness Visit. Additional risk factors are reflected in social history. Cardiac Risk Factors include: advanced age (>70men, >50 women);diabetes mellitus;dyslipidemia;family history of premature cardiovascular disease;hypertension;male gender     Objective:    Today's Vitals   03/14/21 1521  BP: 127/80  Pulse: 67  Temp: 98.3 F (36.8 C)  SpO2: 96%  Weight: 178 lb 9.6 oz (81 kg)  Height: 5\' 10"  (1.778 m)  PainSc: 0-No pain   Body mass index is 25.63 kg/m.  Advanced Directives 03/14/2021 01/02/2017  Does Patient Have a Medical Advance Directive? Yes Yes  Type of Advance Directive Living will;Healthcare Power of Attorney -  Does patient want to make changes to medical advance directive? No - Patient declined Yes (ED - Information included in AVS)  Copy of [place] in Chart? No - copy requested -    Current Medications (verified) Outpatient Encounter Medications as of 03/14/2021  Medication Sig  . amLODipine (NORVASC) 5 MG tablet TAKE ONE TABLET BY MOUTH ONE TIME DAILY  . aspirin EC 81 MG EC tablet Take 81 mg by mouth daily.  Marland Kitchen atorvastatin (LIPITOR) 20 MG tablet TAKE ONE TABLET BY MOUTH ONE TIME DAILY  . Cholecalciferol (VITAMIN D3 PO) Take 125 mcg by mouth daily.  . Ferrous Gluconate-C-Folic Acid (IRON-C PO) Take by mouth.  . Glucosamine 500 MG TABS Take by mouth.  Marland Kitchen HYDROcodone-acetaminophen (NORCO) 10-325 MG tablet TAKE HALF TO ONE TABLET BY MOUTH AT BEDTIME AS NEEDED  . lisinopril (ZESTRIL) 40 MG tablet TAKE ONE TABLET BY MOUTH ONE TIME DAILY  . metFORMIN (GLUCOPHAGE-XR) 500 MG 24 hr tablet take 3 tablets by mouth once a day with breakfast  . metoprolol succinate (TOPROL-XL) 50 MG 24 hr tablet TAKE HALF TABLET BY MOUTH DAILY  . Multiple Vitamin (MULTIVITAMIN) capsule Take 1 capsule by mouth  daily.  . Saw Palmetto 450 MG CAPS Take 2 capsules by mouth daily.   No facility-administered encounter medications on file as of 03/14/2021.    Allergies (verified) Sulfa antibiotics, Doxycycline hyclate, Peanut-containing drug products, and Sulfonamide derivatives   History: Past Medical History:  Diagnosis Date  . ALLERGIC RHINITIS 06/08/2007  . ANXIETY 06/08/2007  . Cervicalgia 12/05/2007  . COLONIC POLYPS, HX OF 06/08/2007  . Cough 08/12/2010  . DEGENERATION, CERVICAL DISC 07/30/2007  . DEPENDENCE, ALCOHOL NEC/NOS, IN REMISSION 06/08/2007  . DEPRESSION 06/08/2007  . Diabetes mellitus without complication ([place])   . DIVERTICULOSIS, COLON 06/08/2007  . EPIGASTRIC TENDERNESS 12/05/2007  . FATIGUE 09/10/2008  . GERD 06/08/2007  . Headache(784.0) 06/08/2007  . HYPERLIPIDEMIA 06/08/2007  . HYPERTENSION 06/08/2007  . IBS 06/08/2007  . Impaired glucose tolerance 02/10/2011  . LOW BACK PAIN, CHRONIC 08/12/2010  . Nocturia 07/16/2009  . PILONIDAL CYST 06/08/2007  . PLANTAR FASCIITIS 06/08/2007   Past Surgical History:  Procedure Laterality Date  . bladder and prostate surgury    . CERVICAL FUSION    . COLONOSCOPY  last colon 11/03/2008   w/Brodie=normal exam  . pilonidal cyst surgury     Family History  Problem Relation Age of Onset  . Heart disease Mother        mature heart disease  . Kidney disease Other   . Heart attack Paternal Aunt   . Colon cancer Neg Hx   . Colon polyps Neg Hx   . Rectal cancer  Neg Hx   . Stomach cancer Neg Hx    Social History   Socioeconomic History  . Marital status: Divorced    Spouse name: Not on file  . Number of children: 3  . Years of education: Not on file  . Highest education level: Not on file  Occupational History  . Occupation: retired Engineering geologist delivery  Tobacco Use  . Smoking status: Never Smoker  . Smokeless tobacco: Never Used  Vaping Use  . Vaping Use: Never used  Substance and Sexual Activity  . Alcohol use: No  . Drug use:  No  . Sexual activity: Not on file  Other Topics Concern  . Not on file  Social History Narrative  . Not on file   Social Determinants of Health   Financial Resource Strain: Low Risk   . Difficulty of Paying Living Expenses: Not hard at all  Food Insecurity: No Food Insecurity  . Worried About Charity fundraiser in the Last Year: Never true  . Ran Out of Food in the Last Year: Never true  Transportation Needs: No Transportation Needs  . Lack of Transportation (Medical): No  . Lack of Transportation (Non-Medical): No  Physical Activity: Sufficiently Active  . Days of Exercise per Week: 5 days  . Minutes of Exercise per Session: 30 min  Stress: No Stress Concern Present  . Feeling of Stress : Not at all  Social Connections: Moderately Integrated  . Frequency of Communication with Friends and Family: More than three times a week  . Frequency of Social Gatherings with Friends and Family: Once a week  . Attends Religious Services: 1 to 4 times per year  . Active Member of Clubs or Organizations: Yes  . Attends Archivist Meetings: 1 to 4 times per year  . Marital Status: Widowed    Tobacco Counseling Counseling given: Not Answered   Clinical Intake:  Pre-visit preparation completed: Yes  Pain : No/denies pain Pain Score: 0-No pain     BMI - recorded: 25.63 Nutritional Status: BMI 25 -29 Overweight Nutritional Risks: None Diabetes: Yes CBG done?: No Did pt. 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 yes  Interpreter Needed?: No  Information entered by :: Lisette Abu, LPN   Activities of Daily Living In your present state of health, do you have any difficulty performing the following activities: 03/14/2021  01/21/2021  Hearing? N N  Vision? N N  Difficulty concentrating or making decisions? N Y  Walking or climbing stairs? N N  Dressing or bathing? N N  Doing errands, shopping? N N  Preparing Food and eating ? N -  Using the Toilet? N -  In the past six months, have you accidently leaked urine? N -  Do you have problems with loss of bowel control? N -  Managing your Medications? N -  Managing your Finances? N -  Housekeeping or managing your Housekeeping? N -  Some recent data might be hidden    Patient Care Team: Biagio Borg, MD as PCP - General  Indicate any recent Medical Services you may have received from other than [facility] providers in the past year (date may be approximate).     Assessment:   This  is a routine wellness examination for [place].  Hearing/Vision screen No exam data present  Dietary issues and exercise activities discussed: Current Exercise Habits: Home exercise routine, Type of exercise: walking, Time (Minutes): 30, Frequency (Times/Week): 5, Weekly Exercise (Minutes/Week): 150, Intensity: Moderate, Exercise limited by: orthopedic condition(s)  Goals Addressed   None    Depression Screen PHQ 2/9 Scores 03/14/2021 01/21/2021 06/01/2020 12/02/2019 05/30/2019 01/30/2018 01/02/2017  PHQ - 2 Score 0 0 1 1 0 0 0  PHQ- 9 Score - - - - - 0 -    Fall Risk Fall Risk  03/14/2021 01/21/2021 06/01/2020 06/01/2020 12/02/2019  Falls in the past year? 0 0 1 0 0  Comment - - - - -  Number falls in past yr: 0 - 0 0 -  Injury with Fall? 0 - 0 0 -  Risk for fall due to : No Fall Risks - - No Fall Risks -  Follow up Falls evaluation completed - - Falls evaluation completed -    FALL RISK PREVENTION PERTAINING TO THE HOME:  Any stairs in or around the home? Yes  If so, are there any without handrails? No  Home free of loose throw rugs in walkways, pet beds, electrical cords, etc? Yes  Adequate lighting in your home to reduce risk of falls? Yes   ASSISTIVE DEVICES UTILIZED TO PREVENT  FALLS:  Life alert? No  Use of a cane, walker or w/c? No  Grab bars in the bathroom? Yes  Shower chair or bench in shower? No  Elevated toilet seat or a handicapped toilet? No   TIMED UP AND GO:  Was the test performed? No .  Length of time to ambulate 10 feet: 0 sec.   Gait steady and fast without use of assistive device  Cognitive Function: Normal cognitive status assessed by direct observation by this Nurse Health Advisor. No abnormalities found.          Immunizations Immunization History  Administered Date(s) Administered  . Fluad Quad(high Dose 65+) 07/03/2020  . Influenza Split 08/14/2011, 08/02/2012  . Influenza Whole 09/16/2004, 08/04/2008, 08/12/2010  . Influenza, High Dose Seasonal PF 08/19/2015, 08/01/2017, 08/08/2018  . Influenza,inj,Quad PF,6+ Mos 08/06/2013, 08/12/2014, 08/14/2019  . PFIZER(Purple Top)SARS-COV-2 Vaccination 12/21/2019, 01/13/2020  . Pneumococcal Conjugate-13 08/06/2013  . Pneumococcal Polysaccharide-23 09/10/2008, 07/11/2016  . Td 10/30/1993, 09/10/2008  . Tdap 06/01/2020    TDAP status: Up to date  Flu Vaccine status: Up to date  Pneumococcal vaccine status: Up to date  Covid-19 vaccine status: Completed vaccines  Qualifies for Shingles Vaccine? Yes   Zostavax completed No   Shingrix Completed?: No.    Education has been provided regarding the importance of this vaccine. Patient has been advised to call insurance company to determine out of pocket expense if they have not yet received this vaccine. Advised may also receive vaccine at local pharmacy or Health Dept. Verbalized acceptance and understanding.  Screening Tests Health Maintenance  Topic Date Due  . OPHTHALMOLOGY EXAM  04/18/2021 (Originally 08/14/2018)  . COVID-19 Vaccine (3 - Booster for Pfizer series) 08/09/2021 (Originally 06/14/2020)  . INFLUENZA VACCINE  05/30/2021  . FOOT EXAM  06/01/2021  . HEMOGLOBIN A1C  07/24/2021  . TETANUS/TDAP  06/01/2030  . Hepatitis C  Screening  Completed  . PNA vac Low Risk Adult  Completed  . HPV VACCINES  Aged Out    Health Maintenance  There are no preventive care reminders to display for this patient.  Colorectal cancer screening: Type of screening:  Colonoscopy. Completed 12/26/2018. Repeat every 10 years  Lung Cancer Screening: (Low Dose CT Chest recommended if Age 92-80 years, 30 pack-year currently smoking OR have quit w/in 15years.) does not qualify.   Lung Cancer Screening Referral: no  Additional Screening:  Hepatitis C Screening: does qualify; Completed yes  Vision Screening: Recommended annual ophthalmology exams for early detection of glaucoma and other disorders of the eye. Is the patient up to date with their annual eye exam?  Yes  Who is the provider or what is the name of the office in which the patient attends annual eye exams? [facility] If pt is not established with a provider, would they like to be referred to a provider to establish care? No .   Dental Screening: Recommended annual dental exams for proper oral hygiene  Community Resource Referral / Chronic Care Management: CRR required this visit?  No   CCM required this visit?  No      Plan:     I have personally reviewed and noted the following in the patient's chart:   . Medical and social history . Use of alcohol, tobacco or illicit drugs  . Current medications and supplements including opioid prescriptions. Patient is currently taking opioid prescriptions. Information provided to patient regarding non-opioid alternatives. Patient advised to discuss non-opioid treatment plan with their provider. . Functional ability and status . Nutritional status . Physical activity . Advanced directives . List of other physicians . Hospitalizations, surgeries, and ER visits in previous 12 months . Vitals . Screenings to include cognitive, depression, and falls . Referrals and appointments  In addition, I have reviewed and discussed  with patient certain preventive protocols, quality metrics, and best practice recommendations. A written personalized care plan for preventive services as well as general preventive health recommendations were provided to patient.     Sheral Flow, LPN   [id number]   Nurse Notes: n/a

## 2021-03-21 ENCOUNTER — Other Ambulatory Visit: Payer: Self-pay | Admitting: Internal Medicine

## 2021-03-27 ENCOUNTER — Telehealth: Payer: Self-pay | Admitting: Internal Medicine

## 2021-03-27 NOTE — Telephone Encounter (Signed)
rror

## 2021-05-09 ENCOUNTER — Other Ambulatory Visit: Payer: Self-pay | Admitting: Internal Medicine

## 2021-05-10 NOTE — Telephone Encounter (Signed)
Hydrocodone done erx  Please let pt know that this is last refill, as wel mentioned previously this year, that I would no longer be able to continue this medication after July1, and he was referred to pain management in mar 2022

## 2021-05-23 NOTE — Progress Notes (Signed)
Cardiology Office Note   Date:  05/25/2021   ID:  David Bolton, DOB 14-Oct-1941, MRN [id number]  PCP:  Biagio Borg, MD  Cardiologist:   Anabelen Kaminsky [place], MD   Chief Complaint  Patient presents with   Aortic Stenosis      History of Present Illness: David Bolton is a 79 y.o. male who is seen at the request of Dr Jenny Reichmann for evaluation of aortic stenosis. He has a history of DM, HTN, HLD.   He was noted to have a murmur and had an Echo 2 years ago showing moderate AS. He states he is doing well. He walks up to 5 miles a day without any chest pain, dyspnea, or lightheadedness. No palpitations. BP is controlled. Had remote stress Myoview in 2006 that was normal.     Past Medical History:  Diagnosis Date   ALLERGIC RHINITIS 06/08/2007   ANXIETY 06/08/2007   Cervicalgia 12/05/2007   COLONIC POLYPS, HX OF 06/08/2007   Cough 08/12/2010   DEGENERATION, CERVICAL DISC 07/30/2007   DEPENDENCE, ALCOHOL NEC/NOS, IN REMISSION 06/08/2007   DEPRESSION 06/08/2007   Diabetes mellitus without complication ([place])    DIVERTICULOSIS, COLON 06/08/2007   EPIGASTRIC TENDERNESS 12/05/2007   FATIGUE 09/10/2008   GERD 06/08/2007   Headache(784.0) 06/08/2007   HYPERLIPIDEMIA 06/08/2007   HYPERTENSION 06/08/2007   IBS 06/08/2007   Impaired glucose tolerance 02/10/2011   LOW BACK PAIN, CHRONIC 08/12/2010   Nocturia 07/16/2009   PILONIDAL CYST 06/08/2007   PLANTAR FASCIITIS 06/08/2007    Past Surgical History:  Procedure Laterality Date   bladder and prostate surgury     CERVICAL FUSION     COLONOSCOPY  last colon 11/03/2008   w/Brodie=normal exam   pilonidal cyst surgury       Current Outpatient Medications  Medication Sig Dispense Refill   amLODipine (NORVASC) 5 MG tablet TAKE ONE TABLET BY MOUTH ONE TIME DAILY 90 tablet 0   aspirin EC 81 MG EC tablet Take 81 mg by mouth daily.     atorvastatin (LIPITOR) 20 MG tablet TAKE ONE TABLET BY MOUTH ONE TIME DAILY 90 tablet 0   Cholecalciferol (VITAMIN D3 PO) Take 125 mcg  by mouth daily.     Ferrous Gluconate-C-Folic Acid (IRON-C PO) Take by mouth.     Glucosamine 500 MG TABS Take by mouth.     HYDROcodone-acetaminophen (NORCO) 10-325 MG tablet TAKE HALF TO ONE TABLET BY MOUTH AT BEDTIME AS NEEDED 30 tablet 0   lisinopril (ZESTRIL) 40 MG tablet TAKE ONE TABLET BY MOUTH ONE TIME DAILY 90 tablet 1   metFORMIN (GLUCOPHAGE-XR) 500 MG 24 hr tablet take 3 tablets by mouth once a day with breakfast 270 tablet 0   metoprolol succinate (TOPROL-XL) 50 MG 24 hr tablet TAKE HALF TABLET BY MOUTH DAILY 90 tablet 1   Multiple Vitamin (MULTIVITAMIN) capsule Take 1 capsule by mouth daily.     Saw Palmetto 450 MG CAPS Take 2 capsules by mouth daily.     No current facility-administered medications for this visit.    Allergies:   Sulfa antibiotics, Doxycycline hyclate, Peanut-containing drug products, and Sulfonamide derivatives    Social History:  The patient  reports that he has never smoked. He has never used smokeless tobacco. He reports that he does not drink alcohol and does not use drugs.   Family History:  The patient's family history includes Heart attack in his paternal aunt; Heart disease in his mother; Kidney disease in an other family  member.    ROS:  Please see the history of present illness.   Otherwise, review of systems are positive for none.   All other systems are reviewed and negative.    PHYSICAL EXAM: VS:  BP 140/84 (BP Location: Right Arm)   Pulse 63   Ht '5\' 8"'$  (1.727 m)   Wt 172 lb 6.4 oz (78.2 kg)   SpO2 98%   BMI 26.21 kg/m  , BMI Body mass index is 26.21 kg/m. GEN: Well nourished, well developed, in no acute distress HEENT: normal Neck: no JVD, carotid bruits, or masses Cardiac: RRR; gr 2/6 systolic murmur RUSB, no rubs, or gallops,no edema  Respiratory:  clear to auscultation bilaterally, normal work of breathing GI: soft, nontender, nondistended, + BS MS: no deformity or atrophy Skin: warm and dry, no rash Neuro:  Strength and  sensation are intact Psych: euthymic mood, full affect   EKG:  EKG is ordered today. The ekg ordered today demonstrates NSR rate 63. Normal. I have personally reviewed and interpreted this study.    Recent Labs: 06/01/2020: Hemoglobin 14.5; Platelets 247; TSH 1.56 01/21/2021: ALT 13; BUN 14; Creatinine, Ser 0.82; Potassium 4.2; Sodium 137    Lipid Panel    Component Value Date/Time   CHOL 181 01/21/2021 1024   TRIG 93.0 01/21/2021 1024   TRIG 250 (HH) 10/31/2006 1628   HDL 48.50 01/21/2021 1024   CHOLHDL 4 01/21/2021 1024   VLDL 18.6 01/21/2021 1024   LDLCALC 114 (H) 01/21/2021 1024   LDLCALC 105 (H) 06/01/2020 0943   LDLDIRECT 139.0 02/10/2011 0916      Wt Readings from Last 3 Encounters:  05/25/21 172 lb 6.4 oz (78.2 kg)  03/14/21 178 lb 9.6 oz (81 kg)  01/21/21 177 lb (80.3 kg)      Other studies Reviewed: Additional studies/ records that were reviewed today include:   Normal Myoview in December 2006.   Echo 06/06/19: IMPRESSIONS     1. The left ventricle has normal systolic function with an ejection  fraction of 60-65%. The cavity size was normal. There is mild asymmetric  left ventricular hypertrophy. Left ventricular diastolic Doppler  parameters are consistent with impaired  relaxation.   2. The right ventricle has normal systolic function. The cavity was  normal. There is no increase in right ventricular wall thickness. Right  ventricular systolic pressure is normal with an estimated pressure of 30.7  mmHg.   3. The aortic valve is abnormal. Severe calcifcation of the aortic valve.  Moderate stenosis of the aortic valve. Mean systolic gradient 23 mmHg.   4. The aorta is normal in size and structure.    ASSESSMENT AND PLAN:  1.  Moderate Aortic stenosis. Currently asymptomatic. Last Echo 2 years ago. Recommend repeat Echo at this time to assess any progression. Discussed pathology and cardinal symptoms with patient. If Echo stable follow up 1-2 years 2.  HTN controlled 3. Hypercholesterolemia. On statin 4. DM type 2 per primary care.    Current medicines are reviewed at length with the patient today.  The patient does not have concerns regarding medicines.  The following changes have been made:  no change  Labs/ tests ordered today include:   Orders Placed This Encounter  Procedures   EKG 12-Lead   ECHOCARDIOGRAM COMPLETE      Disposition:   FU with me in 1 year  Signed, Tymesha Ditmore [place], MD  05/25/2021 5:09 PM    [facility] [address], [place], [place],  [address] Phone [phone], Fax [phone]

## 2021-05-25 ENCOUNTER — Ambulatory Visit (INDEPENDENT_AMBULATORY_CARE_PROVIDER_SITE_OTHER): Payer: Medicare Other | Admitting: Cardiology

## 2021-05-25 ENCOUNTER — Encounter: Payer: Self-pay | Admitting: Cardiology

## 2021-05-25 ENCOUNTER — Other Ambulatory Visit: Payer: Self-pay

## 2021-05-25 VITALS — BP 140/84 | HR 63 | Ht 68.0 in | Wt 172.4 lb

## 2021-05-25 DIAGNOSIS — E7800 Pure hypercholesterolemia, unspecified: Secondary | ICD-10-CM

## 2021-05-25 DIAGNOSIS — I10 Essential (primary) hypertension: Secondary | ICD-10-CM | POA: Diagnosis not present

## 2021-05-25 DIAGNOSIS — I350 Nonrheumatic aortic (valve) stenosis: Secondary | ICD-10-CM | POA: Diagnosis not present

## 2021-05-25 NOTE — Patient Instructions (Signed)
Medication Instructions:   Continue same medications    Lab Work:  None ordered   Testing/Procedures:  Schedule Echo   Follow-Up: At [facility], you and your health needs are our priority.  As part of our continuing mission to provide you with exceptional heart care, we have created designated Provider Care Teams.  These Care Teams include your primary Cardiologist (physician) and Advanced Practice Providers (APPs -  Physician Assistants and Nurse Practitioners) who all work together to provide you with the care you need, when you need it.  We recommend signing up for the patient portal called "MyChart".  Sign up information is provided on this After Visit Summary.  MyChart is used to connect with patients for Virtual Visits (Telemedicine).  Patients are able to view lab/test results, encounter notes, upcoming appointments, etc.  Non-urgent messages can be sent to your provider as well.   To learn more about what you can do with MyChart, go to [email or web address].    Your next appointment:  1 year   Call in April to schedule July appointment    The format for your next appointment: Office   Provider:  Dr.Jordan

## 2021-05-30 ENCOUNTER — Other Ambulatory Visit: Payer: Self-pay | Admitting: Internal Medicine

## 2021-05-30 NOTE — Telephone Encounter (Signed)
Please refill as per office routine med refill policy (all routine meds refilled for 3 mo or monthly per pt preference up to one year from last visit, then month to month grace period for 3 mo, then further med refills will have to be denied)  

## 2021-06-27 ENCOUNTER — Other Ambulatory Visit: Payer: Self-pay | Admitting: Internal Medicine

## 2021-06-28 ENCOUNTER — Ambulatory Visit (HOSPITAL_COMMUNITY): Payer: Medicare Other | Attending: Cardiovascular Disease

## 2021-06-28 ENCOUNTER — Other Ambulatory Visit: Payer: Self-pay

## 2021-06-28 DIAGNOSIS — I350 Nonrheumatic aortic (valve) stenosis: Secondary | ICD-10-CM | POA: Diagnosis not present

## 2021-06-28 DIAGNOSIS — I77810 Thoracic aortic ectasia: Secondary | ICD-10-CM

## 2021-06-28 DIAGNOSIS — I10 Essential (primary) hypertension: Secondary | ICD-10-CM | POA: Diagnosis not present

## 2021-06-28 DIAGNOSIS — E7800 Pure hypercholesterolemia, unspecified: Secondary | ICD-10-CM | POA: Insufficient documentation

## 2021-06-28 HISTORY — DX: Nonrheumatic aortic (valve) stenosis: I35.0

## 2021-06-28 HISTORY — DX: Thoracic aortic ectasia: I77.810

## 2021-06-28 LAB — ECHOCARDIOGRAM COMPLETE
AR max vel: 1.4 cm2
AV Area VTI: 1.37 cm2
AV Area mean vel: 1.28 cm2
AV Mean grad: 29.3 mmHg
AV Peak grad: 51.1 mmHg
Ao pk vel: 3.57 m/s
Area-P 1/2: 2.14 cm2
P 1/2 time: 966 msec
S' Lateral: 2.3 cm

## 2021-06-29 ENCOUNTER — Other Ambulatory Visit: Payer: Self-pay

## 2021-06-29 DIAGNOSIS — I350 Nonrheumatic aortic (valve) stenosis: Secondary | ICD-10-CM

## 2021-07-25 ENCOUNTER — Ambulatory Visit (INDEPENDENT_AMBULATORY_CARE_PROVIDER_SITE_OTHER): Payer: Medicare Other | Admitting: Internal Medicine

## 2021-07-25 ENCOUNTER — Encounter: Payer: Self-pay | Admitting: Internal Medicine

## 2021-07-25 ENCOUNTER — Other Ambulatory Visit: Payer: Self-pay

## 2021-07-25 VITALS — BP 122/80 | HR 60 | Temp 98.0°F | Resp 18 | Ht 68.0 in | Wt 175.6 lb

## 2021-07-25 DIAGNOSIS — I350 Nonrheumatic aortic (valve) stenosis: Secondary | ICD-10-CM | POA: Diagnosis not present

## 2021-07-25 DIAGNOSIS — I10 Essential (primary) hypertension: Secondary | ICD-10-CM

## 2021-07-25 DIAGNOSIS — E538 Deficiency of other specified B group vitamins: Secondary | ICD-10-CM | POA: Diagnosis not present

## 2021-07-25 DIAGNOSIS — E7800 Pure hypercholesterolemia, unspecified: Secondary | ICD-10-CM

## 2021-07-25 DIAGNOSIS — Z23 Encounter for immunization: Secondary | ICD-10-CM | POA: Diagnosis not present

## 2021-07-25 DIAGNOSIS — E559 Vitamin D deficiency, unspecified: Secondary | ICD-10-CM | POA: Diagnosis not present

## 2021-07-25 DIAGNOSIS — R972 Elevated prostate specific antigen [PSA]: Secondary | ICD-10-CM | POA: Diagnosis not present

## 2021-07-25 DIAGNOSIS — Z0001 Encounter for general adult medical examination with abnormal findings: Secondary | ICD-10-CM | POA: Diagnosis not present

## 2021-07-25 DIAGNOSIS — E1165 Type 2 diabetes mellitus with hyperglycemia: Secondary | ICD-10-CM | POA: Diagnosis not present

## 2021-07-25 LAB — VITAMIN D 25 HYDROXY (VIT D DEFICIENCY, FRACTURES): VITD: 82.26 ng/mL (ref 30.00–100.00)

## 2021-07-25 LAB — CBC WITH DIFFERENTIAL/PLATELET
Basophils Absolute: 0 10*3/uL (ref 0.0–0.1)
Basophils Relative: 0.7 % (ref 0.0–3.0)
Eosinophils Absolute: 0.1 10*3/uL (ref 0.0–0.7)
Eosinophils Relative: 1.8 % (ref 0.0–5.0)
HCT: 42.4 % (ref 39.0–52.0)
Hemoglobin: 14.5 g/dL (ref 13.0–17.0)
Lymphocytes Relative: 26.5 % (ref 12.0–46.0)
Lymphs Abs: 1.7 10*3/uL (ref 0.7–4.0)
MCHC: 34.2 g/dL (ref 30.0–36.0)
MCV: 90.4 fl (ref 78.0–100.0)
Monocytes Absolute: 0.7 10*3/uL (ref 0.1–1.0)
Monocytes Relative: 10.3 % (ref 3.0–12.0)
Neutro Abs: 3.9 10*3/uL (ref 1.4–7.7)
Neutrophils Relative %: 60.7 % (ref 43.0–77.0)
Platelets: 240 10*3/uL (ref 150.0–400.0)
RBC: 4.69 Mil/uL (ref 4.22–5.81)
RDW: 12.9 % (ref 11.5–15.5)
WBC: 6.4 10*3/uL (ref 4.0–10.5)

## 2021-07-25 LAB — MICROALBUMIN / CREATININE URINE RATIO
Creatinine,U: 104.1 mg/dL
Microalb Creat Ratio: 4.8 mg/g (ref 0.0–30.0)
Microalb, Ur: 5 mg/dL — ABNORMAL HIGH (ref 0.0–1.9)

## 2021-07-25 LAB — BASIC METABOLIC PANEL
BUN: 12 mg/dL (ref 6–23)
CO2: 28 mEq/L (ref 19–32)
Calcium: 9.4 mg/dL (ref 8.4–10.5)
Chloride: 101 mEq/L (ref 96–112)
Creatinine, Ser: 0.8 mg/dL (ref 0.40–1.50)
GFR: 85.18 mL/min (ref >60.00)
Glucose, Bld: 131 mg/dL — ABNORMAL HIGH (ref 70–99)
Potassium: 4.3 mEq/L (ref 3.5–5.1)
Sodium: 137 mEq/L (ref 135–145)

## 2021-07-25 LAB — URINALYSIS, ROUTINE W REFLEX MICROSCOPIC
Bilirubin Urine: NEGATIVE
Hgb urine dipstick: NEGATIVE
Ketones, ur: NEGATIVE
Leukocytes,Ua: NEGATIVE
Nitrite: NEGATIVE
RBC / HPF: NONE SEEN (ref 0–?)
Specific Gravity, Urine: 1.02 (ref 1.000–1.030)
Total Protein, Urine: NEGATIVE
Urine Glucose: NEGATIVE
Urobilinogen, UA: 0.2 (ref 0.0–1.0)
pH: 6.5 (ref 5.0–8.0)

## 2021-07-25 LAB — LIPID PANEL
Cholesterol: 139 mg/dL (ref 0–200)
HDL: 52.3 mg/dL (ref >39.00)
LDL Cholesterol: 67 mg/dL (ref 0–99)
NonHDL: 86.66
Total CHOL/HDL Ratio: 3
Triglycerides: 100 mg/dL (ref 0.0–149.0)
VLDL: 20 mg/dL (ref 0.0–40.0)

## 2021-07-25 LAB — HEPATIC FUNCTION PANEL
ALT: 11 U/L (ref 0–53)
AST: 18 U/L (ref 0–37)
Albumin: 4.3 g/dL (ref 3.5–5.2)
Alkaline Phosphatase: 63 U/L (ref 39–117)
Bilirubin, Direct: 0.2 mg/dL (ref 0.0–0.3)
Total Bilirubin: 0.7 mg/dL (ref 0.2–1.2)
Total Protein: 7 g/dL (ref 6.0–8.3)

## 2021-07-25 LAB — VITAMIN B12: Vitamin B-12: 314 pg/mL (ref 211–911)

## 2021-07-25 LAB — PSA: PSA: 3.37 ng/mL (ref 0.10–4.00)

## 2021-07-25 LAB — HEMOGLOBIN A1C: Hgb A1c MFr Bld: 6.6 % — ABNORMAL HIGH (ref 4.6–6.5)

## 2021-07-25 LAB — TSH: TSH: 2.02 u[IU]/mL (ref 0.35–5.50)

## 2021-07-25 NOTE — Patient Instructions (Signed)
You had the flu shot today  Please continue all other medications as before, and refills have been done if requested.  Please have the pharmacy call with any other refills you may need.  Please continue your efforts at being more active, low cholesterol diet, and weight control.  You are otherwise up to date with prevention measures today.  Please keep your appointments with your specialists as you may have planned  .Please go to the LAB at the blood drawing area for the tests to be done  You will be contacted by phone if any changes need to be made immediately.  Otherwise, you will receive a letter about your results with an explanation, but please check with MyChart first.  Please remember to sign up for MyChart if you have not done so, as this will be important to you in the future with finding out test results, communicating by private email, and scheduling acute appointments online when needed.  Please make an Appointment to return in 6 months, or sooner if needed  

## 2021-07-25 NOTE — Progress Notes (Signed)
Patient ID: David Bolton, male   DOB: 04-Nov-1940, 80 y.o.   MRN: [id number]         Chief Complaint:: wellness exam and mod AS, chronic neck pain, dm, hld, htn       HPI:  David Bolton is a 80 y.o. male here for wellness exam; has eye doctor appt in oct 2022; ok for flu shot, declines covid booster and shingrix o/w up to date with preventive referrals and immunizations.    Pt continues to have recurring neck pain without change in severity, bowel or bladder change, fever, wt loss,  worsening LE pain/numbness/weakness, gait change or falls, takes occasional vicodin qhs prn.  Pt denies chest pain, increased sob or doe, wheezing, orthopnea, PND, increased LE swelling, palpitations, dizziness or syncope.  Pt denies polydipsia, polyuria, or new focal neuro s/s.  Trying to follow low chol diet.   Peak wt post retirement has been up to 212, now improved gradually over several yrs.  Walks 5 miles per day Wt Readings from Last 3 Encounters:  07/25/21 175 lb 9.6 oz (79.7 kg)  05/25/21 172 lb 6.4 oz (78.2 kg)  03/14/21 178 lb 9.6 oz (81 kg)   BP Readings from Last 3 Encounters:  07/25/21 122/80  05/25/21 140/84  03/14/21 127/80   Immunization History  Administered Date(s) Administered   Fluad Quad(high Dose 65+) 07/03/2020, 07/25/2021   Influenza Split 08/14/2011, 08/02/2012   Influenza Whole 09/16/2004, 08/04/2008, 08/12/2010   Influenza, High Dose Seasonal PF 08/19/2015, 08/01/2017, 08/08/2018   Influenza,inj,Quad PF,6+ Mos 08/06/2013, 08/12/2014, 08/14/2019   PFIZER(Purple Top)SARS-COV-2 Vaccination 12/21/2019, 01/13/2020   Pneumococcal Conjugate-13 08/06/2013   Pneumococcal Polysaccharide-23 09/10/2008, 07/11/2016   Td 10/30/1993, 09/10/2008   Tdap 06/01/2020   Health Maintenance Due  Topic Date Due   OPHTHALMOLOGY EXAM  08/14/2018      Past Medical History:  Diagnosis Date   ALLERGIC RHINITIS 06/08/2007   ANXIETY 06/08/2007   Cervicalgia 12/05/2007   COLONIC POLYPS, HX OF  06/08/2007   Cough 08/12/2010   DEGENERATION, CERVICAL DISC 07/30/2007   DEPENDENCE, ALCOHOL NEC/NOS, IN REMISSION 06/08/2007   DEPRESSION 06/08/2007   Diabetes mellitus without complication ([place])    DIVERTICULOSIS, COLON 06/08/2007   EPIGASTRIC TENDERNESS 12/05/2007   FATIGUE 09/10/2008   GERD 06/08/2007   Headache(784.0) 06/08/2007   HYPERLIPIDEMIA 06/08/2007   HYPERTENSION 06/08/2007   IBS 06/08/2007   Impaired glucose tolerance 02/10/2011   LOW BACK PAIN, CHRONIC 08/12/2010   Nocturia 07/16/2009   PILONIDAL CYST 06/08/2007   PLANTAR FASCIITIS 06/08/2007   Past Surgical History:  Procedure Laterality Date   bladder and prostate surgury     CERVICAL FUSION     COLONOSCOPY  last colon 11/03/2008   w/Brodie=normal exam   pilonidal cyst surgury      reports that he has never smoked. He has never used smokeless tobacco. He reports that he does not drink alcohol and does not use drugs. family history includes Heart attack in his paternal aunt; Heart disease in his mother; Kidney disease in an other family member. Allergies  Allergen Reactions   Sulfa Antibiotics Rash    Breaks out around private parts   Doxycycline Hyclate    Peanut-Containing Drug Products     Nose bleed, headache   Sulfonamide Derivatives    Current Outpatient Medications on File Prior to Visit  Medication Sig Dispense Refill   amLODipine (NORVASC) 5 MG tablet TAKE ONE TABLET BY MOUTH ONE TIME DAILY 90 tablet 0   aspirin EC  81 MG EC tablet Take 81 mg by mouth daily.     atorvastatin (LIPITOR) 20 MG tablet TAKE ONE TABLET BY MOUTH ONE TIME DAILY 90 tablet 0   Cholecalciferol (VITAMIN D3 PO) Take 125 mcg by mouth daily.     Ferrous Gluconate-C-Folic Acid (IRON-C PO) Take by mouth.     Glucosamine 500 MG TABS Take by mouth.     HYDROcodone-acetaminophen (NORCO) 10-325 MG tablet TAKE HALF TO ONE TABLET BY MOUTH AT BEDTIME AS NEEDED 30 tablet 0   lisinopril (ZESTRIL) 40 MG tablet TAKE ONE TABLET BY MOUTH ONE TIME DAILY 90 tablet 1    metFORMIN (GLUCOPHAGE-XR) 500 MG 24 hr tablet TAKE THREE TABLETS BY MOUTH IN THE MORNING WITH BREAKFAST 270 tablet 0   metoprolol succinate (TOPROL-XL) 50 MG 24 hr tablet TAKE HALF TABLET BY MOUTH DAILY 90 tablet 1   Multiple Vitamin (MULTIVITAMIN) capsule Take 1 capsule by mouth daily.     Saw Palmetto 450 MG CAPS Take 2 capsules by mouth daily.     No current facility-administered medications on file prior to visit.        ROS:  All others reviewed and negative.  Objective        PE:  BP 122/80   Pulse 60   Temp 98 F (36.7 C) (Oral)   Resp 18   Ht 5\' 8"  (1.727 m)   Wt 175 lb 9.6 oz (79.7 kg)   SpO2 98%   BMI 26.70 kg/m                 Constitutional: Pt appears in NAD               HENT: Head: NCAT.                Right Ear: External ear normal.                 Left Ear: External ear normal.                Eyes: . Pupils are equal, round, and reactive to light. Conjunctivae and EOM are normal               Nose: without d/c or deformity               Neck: Neck supple. Gross normal ROM               Cardiovascular: Normal rate and regular rhythm.  With gr 2/6 sys murmur RUSB               Pulmonary/Chest: Effort normal and breath sounds without rales or wheezing.                Abd:  Soft, NT, ND, + BS, no organomegaly               Neurological: Pt is alert. At baseline orientation, motor grossly intact               Skin: Skin is warm. No rashes, no other new lesions, LE edema - none               Psychiatric: Pt behavior is normal without agitation   Micro: none  Cardiac tracings I have personally interpreted today:  none  Pertinent Radiological findings (summarize): none   Lab Results  Component Value Date   WBC 6.4 07/25/2021   HGB 14.5 07/25/2021   HCT 42.4 07/25/2021   PLT 240.0 07/25/2021  GLUCOSE 131 (H) 07/25/2021   CHOL 139 07/25/2021   TRIG 100.0 07/25/2021   HDL 52.30 07/25/2021   LDLDIRECT 139.0 02/10/2011   LDLCALC 67 07/25/2021   ALT 11  07/25/2021   AST 18 07/25/2021   NA 137 07/25/2021   K 4.3 07/25/2021   CL 101 07/25/2021   CREATININE 0.80 07/25/2021   BUN 12 07/25/2021   CO2 28 07/25/2021   TSH 2.02 07/25/2021   PSA 3.37 07/25/2021   HGBA1C 6.6 (H) 07/25/2021   MICROALBUR 5.0 (H) 07/25/2021   Assessment/Plan:  David Bolton is a 80 y.o. White or Caucasian [1] male with  has a past medical history of ALLERGIC RHINITIS (06/08/2007), ANXIETY (06/08/2007), Cervicalgia (12/05/2007), COLONIC POLYPS, HX OF (06/08/2007), Cough (08/12/2010), DEGENERATION, CERVICAL DISC (07/30/2007), DEPENDENCE, ALCOHOL NEC/NOS, IN REMISSION (06/08/2007), DEPRESSION (06/08/2007), Diabetes mellitus without complication ([place]), DIVERTICULOSIS, COLON (06/08/2007), EPIGASTRIC TENDERNESS (12/05/2007), FATIGUE (09/10/2008), GERD (06/08/2007), Headache(784.0) (06/08/2007), HYPERLIPIDEMIA (06/08/2007), HYPERTENSION (06/08/2007), IBS (06/08/2007), Impaired glucose tolerance (02/10/2011), LOW BACK PAIN, CHRONIC (08/12/2010), Nocturia (07/16/2009), PILONIDAL CYST (06/08/2007), and PLANTAR FASCIITIS (06/08/2007).  Encounter for well adult exam with abnormal findings Age and sex appropriate education and counseling updated with regular exercise and diet Referrals for preventative services - none needed Immunizations addressed - for flu shot, declines covid booster and shingrix Smoking counseling  - none needed Evidence for depression or other mood disorder - none significant Most recent labs reviewed. I have personally reviewed and have noted: 1) the patient's medical and social history 2) The patient's current medications and supplements 3) The patient's height, weight, and BMI have been recorded in the chart   Hyperlipidemia Lab Results  Component Value Date   [place] 67 07/25/2021   Stable, pt to continue current statin lipitor 20   Essential hypertension BP Readings from Last 3 Encounters:  07/25/21 122/80  05/25/21 140/84  03/14/21 127/80   Stable, pt to continue  medical treatment norvasc, lisinopril   Diabetes ([place]) Lab Results  Component Value Date   HGBA1C 6.6 (H) 07/25/2021   Stable, pt to continue current medical treatment metformin   Increased prostate specific antigen (PSA) velocity For f/u psa, o/w asymp,  to f/u any worsening symptoms or concerns, consdier urology f/u  Moderate aortic stenosis Stable, for cardiology f/u as planned  Followup: No follow-ups on file.  Cathlean Cower, MD 07/26/2021 10:38 PM [facility]

## 2021-07-26 NOTE — Assessment & Plan Note (Signed)
Stable, for cardiology f/u as planned

## 2021-07-26 NOTE — Assessment & Plan Note (Signed)
Lab Results  Component Value Date   LDLCALC 67 07/25/2021   Stable, pt to continue current statin lipitor 20

## 2021-07-26 NOTE — Assessment & Plan Note (Signed)
Lab Results  Component Value Date   HGBA1C 6.6 (H) 07/25/2021   Stable, pt to continue current medical treatment metformin

## 2021-07-26 NOTE — Assessment & Plan Note (Signed)
For f/u psa, o/w asymp,  to f/u any worsening symptoms or concerns, consdier urology f/u

## 2021-07-26 NOTE — Assessment & Plan Note (Signed)
Age and sex appropriate education and counseling updated with regular exercise and diet Referrals for preventative services - none needed Immunizations addressed - for flu shot, declines covid booster and shingrix Smoking counseling  - none needed Evidence for depression or other mood disorder - none significant Most recent labs reviewed. I have personally reviewed and have noted: 1) the patient's medical and social history 2) The patient's current medications and supplements 3) The patient's height, weight, and BMI have been recorded in the chart

## 2021-07-26 NOTE — Assessment & Plan Note (Signed)
BP Readings from Last 3 Encounters:  07/25/21 122/80  05/25/21 140/84  03/14/21 127/80   Stable, pt to continue medical treatment norvasc, lisinopril

## 2021-08-17 ENCOUNTER — Other Ambulatory Visit: Payer: Self-pay | Admitting: Internal Medicine

## 2021-08-19 ENCOUNTER — Other Ambulatory Visit: Payer: Self-pay | Admitting: Internal Medicine

## 2021-08-19 NOTE — Telephone Encounter (Signed)
Please refill as per office routine med refill policy (all routine meds to be refilled for 3 mo or monthly (per pt preference) up to one year from last visit, then month to month grace period for 3 mo, then further med refills will have to be denied) ? ?

## 2021-10-01 ENCOUNTER — Other Ambulatory Visit: Payer: Self-pay | Admitting: Internal Medicine

## 2021-10-02 NOTE — Telephone Encounter (Signed)
Please refill as per office routine med refill policy (all routine meds to be refilled for 3 mo or monthly (per pt preference) up to one year from last visit, then month to month grace period for 3 mo, then further med refills will have to be denied) ? ?

## 2021-10-07 ENCOUNTER — Other Ambulatory Visit: Payer: Self-pay | Admitting: Internal Medicine

## 2021-10-29 ENCOUNTER — Other Ambulatory Visit: Payer: Self-pay | Admitting: Internal Medicine

## 2021-10-29 NOTE — Telephone Encounter (Signed)
Please refill as per office routine med refill policy (all routine meds to be refilled for 3 mo or monthly (per pt preference) up to one year from last visit, then month to month grace period for 3 mo, then further med refills will have to be denied) ? ?

## 2021-11-09 ENCOUNTER — Other Ambulatory Visit: Payer: Self-pay | Admitting: Internal Medicine

## 2021-11-09 NOTE — Telephone Encounter (Signed)
Please refill as per office routine med refill policy (all routine meds to be refilled for 3 mo or monthly (per pt preference) up to one year from last visit, then month to month grace period for 3 mo, then further med refills will have to be denied) ? ?

## 2021-12-27 ENCOUNTER — Other Ambulatory Visit: Payer: Self-pay | Admitting: Internal Medicine

## 2022-01-15 ENCOUNTER — Other Ambulatory Visit: Payer: Self-pay | Admitting: Internal Medicine

## 2022-01-16 NOTE — Telephone Encounter (Signed)
Please refill as per office routine med refill policy (all routine meds to be refilled for 3 mo or monthly (per pt preference) up to one year from last visit, then month to month grace period for 3 mo, then further med refills will have to be denied) ? ?

## 2022-02-24 ENCOUNTER — Other Ambulatory Visit: Payer: Self-pay | Admitting: Internal Medicine

## 2022-03-06 ENCOUNTER — Other Ambulatory Visit: Payer: Self-pay | Admitting: Internal Medicine

## 2022-03-06 NOTE — Telephone Encounter (Signed)
Please refill as per office routine med refill policy (all routine meds to be refilled for 3 mo or monthly (per pt preference) up to one year from last visit, then month to month grace period for 3 mo, then further med refills will have to be denied) ? ?

## 2022-03-13 ENCOUNTER — Telehealth: Payer: Self-pay | Admitting: Internal Medicine

## 2022-03-13 NOTE — Telephone Encounter (Signed)
LVM for pt to rtn my call to schedule AWV with [facility]. Please schedule this appt if pt calls the office.  °

## 2022-03-20 ENCOUNTER — Encounter: Payer: Self-pay | Admitting: Internal Medicine

## 2022-03-20 ENCOUNTER — Ambulatory Visit (INDEPENDENT_AMBULATORY_CARE_PROVIDER_SITE_OTHER): Payer: Medicare Other

## 2022-03-20 ENCOUNTER — Ambulatory Visit (INDEPENDENT_AMBULATORY_CARE_PROVIDER_SITE_OTHER): Payer: Medicare Other | Admitting: Internal Medicine

## 2022-03-20 VITALS — BP 122/70 | HR 63 | Temp 98.4°F | Resp 16 | Ht 68.0 in | Wt 173.0 lb

## 2022-03-20 VITALS — BP 122/70 | HR 63 | Temp 98.4°F | Ht 68.0 in | Wt 173.0 lb

## 2022-03-20 DIAGNOSIS — E7800 Pure hypercholesterolemia, unspecified: Secondary | ICD-10-CM

## 2022-03-20 DIAGNOSIS — R972 Elevated prostate specific antigen [PSA]: Secondary | ICD-10-CM

## 2022-03-20 DIAGNOSIS — I10 Essential (primary) hypertension: Secondary | ICD-10-CM

## 2022-03-20 DIAGNOSIS — E1165 Type 2 diabetes mellitus with hyperglycemia: Secondary | ICD-10-CM | POA: Diagnosis not present

## 2022-03-20 DIAGNOSIS — Z0001 Encounter for general adult medical examination with abnormal findings: Secondary | ICD-10-CM

## 2022-03-20 DIAGNOSIS — I350 Nonrheumatic aortic (valve) stenosis: Secondary | ICD-10-CM

## 2022-03-20 DIAGNOSIS — Z0000 Encounter for general adult medical examination without abnormal findings: Secondary | ICD-10-CM

## 2022-03-20 LAB — CBC WITH DIFFERENTIAL/PLATELET
Basophils Absolute: 0 10*3/uL (ref 0.0–0.1)
Basophils Relative: 0.7 % (ref 0.0–3.0)
Eosinophils Absolute: 0.1 10*3/uL (ref 0.0–0.7)
Eosinophils Relative: 1.8 % (ref 0.0–5.0)
HCT: 41.8 % (ref 39.0–52.0)
Hemoglobin: 14.4 g/dL (ref 13.0–17.0)
Lymphocytes Relative: 23.1 % (ref 12.0–46.0)
Lymphs Abs: 1.6 10*3/uL (ref 0.7–4.0)
MCHC: 34.4 g/dL (ref 30.0–36.0)
MCV: 90.8 fl (ref 78.0–100.0)
Monocytes Absolute: 0.7 10*3/uL (ref 0.1–1.0)
Monocytes Relative: 10.1 % (ref 3.0–12.0)
Neutro Abs: 4.4 10*3/uL (ref 1.4–7.7)
Neutrophils Relative %: 64.3 % (ref 43.0–77.0)
Platelets: 232 10*3/uL (ref 150.0–400.0)
RBC: 4.6 Mil/uL (ref 4.22–5.81)
RDW: 13 % (ref 11.5–15.5)
WBC: 6.8 10*3/uL (ref 4.0–10.5)

## 2022-03-20 LAB — HEPATIC FUNCTION PANEL
ALT: 13 U/L (ref 0–53)
AST: 20 U/L (ref 0–37)
Albumin: 4.4 g/dL (ref 3.5–5.2)
Alkaline Phosphatase: 56 U/L (ref 39–117)
Bilirubin, Direct: 0.2 mg/dL (ref 0.0–0.3)
Total Bilirubin: 1 mg/dL (ref 0.2–1.2)
Total Protein: 7.1 g/dL (ref 6.0–8.3)

## 2022-03-20 LAB — LIPID PANEL
Cholesterol: 139 mg/dL (ref 0–200)
HDL: 55.4 mg/dL (ref >39.00)
LDL Cholesterol: 57 mg/dL (ref 0–99)
NonHDL: 83.15
Total CHOL/HDL Ratio: 3
Triglycerides: 131 mg/dL (ref 0.0–149.0)
VLDL: 26.2 mg/dL (ref 0.0–40.0)

## 2022-03-20 LAB — MICROALBUMIN / CREATININE URINE RATIO
Creatinine,U: 105.5 mg/dL
Microalb Creat Ratio: 2.6 mg/g (ref 0.0–30.0)
Microalb, Ur: 2.8 mg/dL — ABNORMAL HIGH (ref 0.0–1.9)

## 2022-03-20 LAB — URINALYSIS, ROUTINE W REFLEX MICROSCOPIC
Bilirubin Urine: NEGATIVE
Hgb urine dipstick: NEGATIVE
Ketones, ur: NEGATIVE
Leukocytes,Ua: NEGATIVE
Nitrite: NEGATIVE
RBC / HPF: NONE SEEN (ref 0–?)
Specific Gravity, Urine: 1.015 (ref 1.000–1.030)
Total Protein, Urine: NEGATIVE
Urine Glucose: NEGATIVE
Urobilinogen, UA: 0.2 (ref 0.0–1.0)
WBC, UA: NONE SEEN (ref 0–?)
pH: 6.5 (ref 5.0–8.0)

## 2022-03-20 LAB — PSA: PSA: 3.13 ng/mL (ref 0.10–4.00)

## 2022-03-20 LAB — BASIC METABOLIC PANEL
BUN: 12 mg/dL (ref 6–23)
CO2: 27 mEq/L (ref 19–32)
Calcium: 9.6 mg/dL (ref 8.4–10.5)
Chloride: 103 mEq/L (ref 96–112)
Creatinine, Ser: 0.87 mg/dL (ref 0.40–1.50)
GFR: 82.67 mL/min (ref >60.00)
Glucose, Bld: 125 mg/dL — ABNORMAL HIGH (ref 70–99)
Potassium: 4.8 mEq/L (ref 3.5–5.1)
Sodium: 138 mEq/L (ref 135–145)

## 2022-03-20 LAB — TSH: TSH: 1.4 u[IU]/mL (ref 0.35–5.50)

## 2022-03-20 LAB — HEMOGLOBIN A1C: Hgb A1c MFr Bld: 6.6 % — ABNORMAL HIGH (ref 4.6–6.5)

## 2022-03-20 MED ORDER — METFORMIN HCL ER 500 MG PO TB24: ORAL_TABLET | ORAL | 2 refills | Status: DC

## 2022-03-20 NOTE — Assessment & Plan Note (Signed)
Lab Results  Component Value Date   LDLCALC 67 07/25/2021   Stable, pt to continue current statin  - lipitor 20

## 2022-03-20 NOTE — Patient Instructions (Signed)
David Bolton , Thank you for taking time to come for your Medicare Wellness Visit. I appreciate your ongoing commitment to your health goals. Please review the following plan we discussed and let me know if I can assist you in the future.   Screening recommendations/referrals: Colonoscopy: Discontinued due to age; last done 12/26/2018 Recommended yearly ophthalmology/optometry visit for glaucoma screening and checkup Recommended yearly dental visit for hygiene and checkup  Vaccinations: Influenza vaccine: 07/25/2021 Pneumococcal vaccine: 08/06/2013, 07/11/2016 Tdap vaccine: 06/01/2020; due every 10 years Shingles vaccine: never done   Covid-19: 12/21/2019, 01/13/2020  Advanced directives: Yes; daughter aware of wishes  Conditions/risks identified: Yes  Next appointment: Please schedule your next Medicare Wellness Visit with your Nurse Health Advisor in 1 year by calling [phone].  Preventive Care 79 Years and Older, Male Preventive care refers to lifestyle choices and visits with your health care provider that can promote health and wellness. What does preventive care include? A yearly physical exam. This is also called an annual well check. Dental exams once or twice a year. Routine eye exams. Ask your health care provider how often you should have your eyes checked. Personal lifestyle choices, including: Daily care of your teeth and gums. Regular physical activity. Eating a healthy diet. Avoiding tobacco and drug use. Limiting alcohol use. Practicing safe sex. Taking low doses of aspirin every day. Taking vitamin and mineral supplements as recommended by your health care provider. What happens during an annual well check? The services and screenings done by your health care provider during your annual well check will depend on your age, overall health, lifestyle risk factors, and family history of disease. Counseling  Your health care provider may ask you questions about  your: Alcohol use. Tobacco use. Drug use. Emotional well-being. Home and relationship well-being. Sexual activity. Eating habits. History of falls. Memory and ability to understand (cognition). Work and work Statistician. Screening  You may have the following tests or measurements: Height, weight, and BMI. Blood pressure. Lipid and cholesterol levels. These may be checked every 5 years, or more frequently if you are over 40 years old. Skin check. Lung cancer screening. You may have this screening every year starting at age 55 if you have a 30-pack-year history of smoking and currently smoke or have quit within the past 15 years. Fecal occult blood test (FOBT) of the stool. You may have this test every year starting at age 37. Flexible sigmoidoscopy or colonoscopy. You may have a sigmoidoscopy every 5 years or a colonoscopy every 10 years starting at age 33. Prostate cancer screening. Recommendations will vary depending on your family history and other risks. Hepatitis C blood test. Hepatitis B blood test. Sexually transmitted disease (STD) testing. Diabetes screening. This is done by checking your blood sugar (glucose) after you have not eaten for a while (fasting). You may have this done every 1-3 years. Abdominal aortic aneurysm (AAA) screening. You may need this if you are a current or former smoker. Osteoporosis. You may be screened starting at age 79 if you are at high risk. Talk with your health care provider about your test results, treatment options, and if necessary, the need for more tests. Vaccines  Your health care provider may recommend certain vaccines, such as: Influenza vaccine. This is recommended every year. Tetanus, diphtheria, and acellular pertussis (Tdap, Td) vaccine. You may need a Td booster every 10 years. Zoster vaccine. You may need this after age 28. Pneumococcal 13-valent conjugate (PCV13) vaccine. One dose is recommended after age  65. Pneumococcal  polysaccharide (PPSV23) vaccine. One dose is recommended after age 79. Talk to your health care provider about which screenings and vaccines you need and how often you need them. This information is not intended to replace advice given to you by your health care provider. Make sure you discuss any questions you have with your health care provider. Document Released: 11/12/2015 Document Revised: 07/05/2016 Document Reviewed: 08/17/2015 Elsevier Interactive Patient Education  2017 [place] Prevention in the Home Falls can cause injuries. They can happen to people of all ages. There are many things you can do to make your home safe and to help prevent falls. What can I do on the outside of my home? Regularly fix the edges of walkways and driveways and fix any cracks. Remove anything that might make you trip as you walk through a door, such as a raised step or threshold. Trim any bushes or trees on the path to your home. Use bright outdoor lighting. Clear any walking paths of anything that might make someone trip, such as rocks or tools. Regularly check to see if handrails are loose or broken. Make sure that both sides of any steps have handrails. Any raised decks and porches should have guardrails on the edges. Have any leaves, snow, or ice cleared regularly. Use sand or salt on walking paths during winter. Clean up any spills in your garage right away. This includes oil or grease spills. What can I do in the bathroom? Use night lights. Install grab bars by the toilet and in the tub and shower. Do not use towel bars as grab bars. Use non-skid mats or decals in the tub or shower. If you need to sit down in the shower, use a plastic, non-slip stool. Keep the floor dry. Clean up any water that spills on the floor as soon as it happens. Remove soap buildup in the tub or shower regularly. Attach bath mats securely with double-sided non-slip rug tape. Do not have throw rugs and other  things on the floor that can make you trip. What can I do in the bedroom? Use night lights. Make sure that you have a light by your bed that is easy to reach. Do not use any sheets or blankets that are too big for your bed. They should not hang down onto the floor. Have a firm chair that has side arms. You can use this for support while you get dressed. Do not have throw rugs and other things on the floor that can make you trip. What can I do in the kitchen? Clean up any spills right away. Avoid walking on wet floors. Keep items that you use a lot in easy-to-reach places. If you need to reach something above you, use a strong step stool that has a grab bar. Keep electrical cords out of the way. Do not use floor polish or wax that makes floors slippery. If you must use wax, use non-skid floor wax. Do not have throw rugs and other things on the floor that can make you trip. What can I do with my stairs? Do not leave any items on the stairs. Make sure that there are handrails on both sides of the stairs and use them. Fix handrails that are broken or loose. Make sure that handrails are as long as the stairways. Check any carpeting to make sure that it is firmly attached to the stairs. Fix any carpet that is loose or worn. Avoid having throw rugs at  the top or bottom of the stairs. If you do have throw rugs, attach them to the floor with carpet tape. Make sure that you have a light switch at the top of the stairs and the bottom of the stairs. If you do not have them, ask someone to add them for you. What else can I do to help prevent falls? Wear shoes that: Do not have high heels. Have rubber bottoms. Are comfortable and fit you well. Are closed at the toe. Do not wear sandals. If you use a stepladder: Make sure that it is fully opened. Do not climb a closed stepladder. Make sure that both sides of the stepladder are locked into place. Ask someone to hold it for you, if possible. Clearly  mark and make sure that you can see: Any grab bars or handrails. First and last steps. Where the edge of each step is. Use tools that help you move around (mobility aids) if they are needed. These include: Canes. Walkers. Scooters. Crutches. Turn on the lights when you go into a dark area. Replace any light bulbs as soon as they burn out. Set up your furniture so you have a clear path. Avoid moving your furniture around. If any of your floors are uneven, fix them. If there are any pets around you, be aware of where they are. Review your medicines with your doctor. Some medicines can make you feel dizzy. This can increase your chance of falling. Ask your doctor what other things that you can do to help prevent falls. This information is not intended to replace advice given to you by your health care provider. Make sure you discuss any questions you have with your health care provider. Document Released: 08/12/2009 Document Revised: 03/23/2016 Document Reviewed: 11/20/2014 Elsevier Interactive Patient Education  2017 [employer].

## 2022-03-20 NOTE — Progress Notes (Signed)
Patient ID: David Bolton, male   DOB: 1941/06/03, 81 y.o.   MRN: [id number]         Chief Complaint:: wellness exam and increasing psa, mod AS, dm, htn. hld       HPI:  David Bolton is a 81 y.o. male here for wellness exam; already up to date                        Also Pt denies chest pain, increased sob or doe, wheezing, orthopnea, PND, increased LE swelling, palpitations, dizziness or syncope.   Pt denies polydipsia, polyuria, or new focal neuro s/s.    Pt denies fever, wt loss, night sweats, loss of appetite, or other constitutional symptoms  in fact, Peak wt has been 212, now walking 5 miles per day.   Wt Readings from Last 3 Encounters:  03/20/22 173 lb (78.5 kg)  03/20/22 173 lb (78.5 kg)  07/25/21 175 lb 9.6 oz (79.7 kg)   BP Readings from Last 3 Encounters:  03/20/22 122/70  03/20/22 122/70  07/25/21 122/80   Immunization History  Administered Date(s) Administered   Fluad Quad(high Dose 65+) 07/03/2020, 07/25/2021   Influenza Split 08/14/2011, 08/02/2012   Influenza Whole 09/16/2004, 08/04/2008, 08/12/2010   Influenza, High Dose Seasonal PF 08/19/2015, 08/01/2017, 08/08/2018   Influenza,inj,Quad PF,6+ Mos 08/06/2013, 08/12/2014, 08/14/2019   PFIZER(Purple Top)SARS-COV-2 Vaccination 12/21/2019, 01/13/2020   Pneumococcal Conjugate-13 08/06/2013   Pneumococcal Polysaccharide-23 09/10/2008, 07/11/2016   Td 10/30/1993, 09/10/2008   Tdap 06/01/2020  There are no preventive care reminders to display for this patient.    Past Medical History:  Diagnosis Date   ALLERGIC RHINITIS 06/08/2007   ANXIETY 06/08/2007   Cervicalgia 12/05/2007   COLONIC POLYPS, HX OF 06/08/2007   Cough 08/12/2010   DEGENERATION, CERVICAL DISC 07/30/2007   DEPENDENCE, ALCOHOL NEC/NOS, IN REMISSION 06/08/2007   DEPRESSION 06/08/2007   Diabetes mellitus without complication ([place])    DIVERTICULOSIS, COLON 06/08/2007   EPIGASTRIC TENDERNESS 12/05/2007   FATIGUE 09/10/2008   GERD 06/08/2007   Headache(784.0)  06/08/2007   HYPERLIPIDEMIA 06/08/2007   HYPERTENSION 06/08/2007   IBS 06/08/2007   Impaired glucose tolerance 02/10/2011   LOW BACK PAIN, CHRONIC 08/12/2010   Nocturia 07/16/2009   PILONIDAL CYST 06/08/2007   PLANTAR FASCIITIS 06/08/2007   Past Surgical History:  Procedure Laterality Date   bladder and prostate surgury     CERVICAL FUSION     COLONOSCOPY  last colon 11/03/2008   w/[place]ie=normal exam   pilonidal cyst surgury      reports that he has never smoked. He has never used smokeless tobacco. He reports that he does not drink alcohol and does not use drugs. family history includes Heart attack in his paternal aunt; Heart disease in his mother; Kidney disease in an other family member. Allergies  Allergen Reactions   Sulfa Antibiotics Rash    Breaks out around private parts   Doxycycline Hyclate    Peanut-Containing Drug Products     Nose bleed, headache   Sulfonamide Derivatives    Current Outpatient Medications on File Prior to Visit  Medication Sig Dispense Refill   amLODipine (NORVASC) 5 MG tablet TAKE ONE TABLET BY MOUTH ONE TIME DAILY 90 tablet 2   aspirin EC 81 MG EC tablet Take 81 mg by mouth daily.     atorvastatin (LIPITOR) 20 MG tablet TAKE ONE TABLET BY MOUTH ONE TIME DAILY 90 tablet 2   Cholecalciferol (VITAMIN D3 PO) Take 125  mcg by mouth daily.     Ferrous Gluconate-C-Folic Acid (IRON-C PO) Take by mouth.     Glucosamine 500 MG TABS Take by mouth.     HYDROcodone-acetaminophen (NORCO) 10-325 MG tablet TAKE HALF TO ONE TABLET BY MOUTH AT BEDTIME AS NEEDED 30 tablet 0   lisinopril (ZESTRIL) 40 MG tablet TAKE ONE TABLET BY MOUTH ONE TIME DAILY 90 tablet 1   metoprolol succinate (TOPROL-XL) 50 MG 24 hr tablet TAKE HALF TABLET BY MOUTH DAILY 90 tablet 1   Multiple Vitamin (MULTIVITAMIN) capsule Take 1 capsule by mouth daily.     Saw Palmetto 450 MG CAPS Take 2 capsules by mouth daily.     No current facility-administered medications on file prior to visit.        ROS:   All others reviewed and negative.  Objective        PE:  BP 122/70 (BP Location: Right Arm, Patient Position: Sitting, Cuff Size: Large)   Pulse 63   Temp 98.4 F (36.9 C) (Oral)   Ht '5\' 8"'$  (1.727 m)   Wt 173 lb (78.5 kg)   SpO2 98%   BMI 26.30 kg/m                 Constitutional: Pt appears in NAD               HENT: Head: NCAT.                Right Ear: External ear normal.                 Left Ear: External ear normal.                Eyes: . Pupils are equal, round, and reactive to light. Conjunctivae and EOM are normal               Nose: without d/c or deformity               Neck: Neck supple. Gross normal ROM               Cardiovascular: Normal rate and regular rhythm.                 Pulmonary/Chest: Effort normal and breath sounds without rales or wheezing.                Abd:  Soft, NT, ND, + BS, no organomegaly               Neurological: Pt is alert. At baseline orientation, motor grossly intact               Skin: Skin is warm. No rashes, no other new lesions, LE edema - none               Psychiatric: Pt behavior is normal without agitation   Micro: none  Cardiac tracings I have personally interpreted today:  none  Pertinent Radiological findings (summarize): none   Lab Results  Component Value Date   WBC 6.4 07/25/2021   HGB 14.5 07/25/2021   HCT 42.4 07/25/2021   PLT 240.0 07/25/2021   GLUCOSE 131 (H) 07/25/2021   CHOL 139 07/25/2021   TRIG 100.0 07/25/2021   HDL 52.30 07/25/2021   LDLDIRECT 139.0 02/10/2011   LDLCALC 67 07/25/2021   ALT 11 07/25/2021   AST 18 07/25/2021   NA 137 07/25/2021   K 4.3 07/25/2021   CL 101 07/25/2021   CREATININE 0.80 07/25/2021  BUN 12 07/25/2021   CO2 28 07/25/2021   TSH 2.02 07/25/2021   PSA 3.37 07/25/2021   HGBA1C 6.6 (H) 07/25/2021   MICROALBUR 5.0 (H) 07/25/2021   Assessment/Plan:  David Bolton is a 81 y.o. White or Caucasian [1] male with  has a past medical history of ALLERGIC RHINITIS (06/08/2007),  ANXIETY (06/08/2007), Cervicalgia (12/05/2007), COLONIC POLYPS, HX OF (06/08/2007), Cough (08/12/2010), DEGENERATION, CERVICAL DISC (07/30/2007), DEPENDENCE, ALCOHOL NEC/NOS, IN REMISSION (06/08/2007), DEPRESSION (06/08/2007), Diabetes mellitus without complication ([place]), DIVERTICULOSIS, COLON (06/08/2007), EPIGASTRIC TENDERNESS (12/05/2007), FATIGUE (09/10/2008), GERD (06/08/2007), Headache(784.0) (06/08/2007), HYPERLIPIDEMIA (06/08/2007), HYPERTENSION (06/08/2007), IBS (06/08/2007), Impaired glucose tolerance (02/10/2011), LOW BACK PAIN, CHRONIC (08/12/2010), Nocturia (07/16/2009), PILONIDAL CYST (06/08/2007), and PLANTAR FASCIITIS (06/08/2007).  Increased prostate specific antigen (PSA) velocity Lab Results  Component Value Date   PSA 3.37 07/25/2021   PSA 2.6 06/01/2020   PSA 2.72 05/30/2019   Somewhat high suspicion for worsening - for f/u psa today  Encounter for well adult exam with abnormal findings Age and sex appropriate education and counseling updated with regular exercise and diet Referrals for preventative services - none needed Immunizations addressed - none needed Smoking counseling  - none needed Evidence for depression or other mood disorder - none significant Most recent labs reviewed. I have personally reviewed and have noted: 1) the patient's medical and social history 2) The patient's current medications and supplements 3) The patient's height, weight, and BMI have been recorded in the chart   Moderate aortic stenosis Asympt, exam benign, declines f/u echo for now  Diabetes [facility]) Lab Results  Component Value Date   HGBA1C 6.6 (H) 07/25/2021   Stable, pt to continue current medical treatment metformin   Essential hypertension BP Readings from Last 3 Encounters:  03/20/22 122/70  03/20/22 122/70  07/25/21 122/80   Stable, pt to continue medical treatment norvasc, lisinopril, toprol   Hyperlipidemia Lab Results  Component Value Date   LDLCALC 67 07/25/2021   Stable, pt to  continue current statin  - lipitor 20  Followup: Return in about 6 months (around 09/20/2022).  Cathlean Cower, MD 03/20/2022 10:53 AM [facility]

## 2022-03-20 NOTE — Assessment & Plan Note (Signed)
Lab Results  Component Value Date   PSA 3.37 07/25/2021   PSA 2.6 06/01/2020   PSA 2.72 05/30/2019   Somewhat high suspicion for worsening - for f/u psa today

## 2022-03-20 NOTE — Assessment & Plan Note (Signed)
Asympt, exam benign, declines f/u echo for now

## 2022-03-20 NOTE — Assessment & Plan Note (Signed)

## 2022-03-20 NOTE — Assessment & Plan Note (Signed)
Lab Results  Component Value Date   HGBA1C 6.6 (H) 07/25/2021   Stable, pt to continue current medical treatment metformin

## 2022-03-20 NOTE — Assessment & Plan Note (Signed)
BP Readings from Last 3 Encounters:  03/20/22 122/70  03/20/22 122/70  07/25/21 122/80   Stable, pt to continue medical treatment norvasc, lisinopril, toprol

## 2022-03-20 NOTE — Progress Notes (Signed)
Subjective:   David Bolton is a 81 y.o. male who presents for Medicare Annual/Subsequent preventive examination.  Review of Systems     Cardiac Risk Factors include: advanced age (>41mn, >>55women);diabetes mellitus;dyslipidemia;family history of premature cardiovascular disease;hypertension;male gender     Objective:    Today's Vitals   03/20/22 0856  BP: 122/70  Pulse: 63  Resp: 16  Temp: 98.4 F (36.9 C)  SpO2: 98%  Weight: 173 lb (78.5 kg)  Height: '5\' 8"'$  (1.727 m)  PainSc: 0-No pain   Body mass index is 26.3 kg/m.     03/14/2021    4:37 PM 01/02/2017    9:46 AM  Advanced Directives  Does Patient Have a Medical Advance Directive? Yes Yes  Type of Advance Directive Living will;Healthcare Power of Attorney   Does patient want to make changes to medical advance directive? No - Patient declined Yes (ED - Information included in AVS)  Copy of [place]? No - copy requested     Current Medications (verified) Outpatient Encounter Medications as of 03/20/2022  Medication Sig   amLODipine (NORVASC) 5 MG tablet TAKE ONE TABLET BY MOUTH ONE TIME DAILY   aspirin EC 81 MG EC tablet Take 81 mg by mouth daily.   atorvastatin (LIPITOR) 20 MG tablet TAKE ONE TABLET BY MOUTH ONE TIME DAILY   Cholecalciferol (VITAMIN D3 PO) Take 125 mcg by mouth daily.   Ferrous Gluconate-C-Folic Acid (IRON-C PO) Take by mouth.   Glucosamine 500 MG TABS Take by mouth.   HYDROcodone-acetaminophen (NORCO) 10-325 MG tablet TAKE HALF TO ONE TABLET BY MOUTH AT BEDTIME AS NEEDED   lisinopril (ZESTRIL) 40 MG tablet TAKE ONE TABLET BY MOUTH ONE TIME DAILY   metFORMIN (GLUCOPHAGE-XR) 500 MG 24 hr tablet TAKE THREE TABLETS BY MOUTH IN THE MORNING WITH BREAKFAST   metoprolol succinate (TOPROL-XL) 50 MG 24 hr tablet TAKE HALF TABLET BY MOUTH DAILY   Multiple Vitamin (MULTIVITAMIN) capsule Take 1 capsule by mouth daily.   Saw Palmetto 450 MG CAPS Take 2 capsules by mouth daily.    No facility-administered encounter medications on file as of 03/20/2022.    Allergies (verified) Sulfa antibiotics, Doxycycline hyclate, Peanut-containing drug products, and Sulfonamide derivatives   History: Past Medical History:  Diagnosis Date   ALLERGIC RHINITIS 06/08/2007   ANXIETY 06/08/2007   Cervicalgia 12/05/2007   COLONIC POLYPS, HX OF 06/08/2007   Cough 08/12/2010   DEGENERATION, CERVICAL DISC 07/30/2007   DEPENDENCE, ALCOHOL NEC/NOS, IN REMISSION 06/08/2007   DEPRESSION 06/08/2007   Diabetes mellitus without complication ([place]    DIVERTICULOSIS, COLON 06/08/2007   EPIGASTRIC TENDERNESS 12/05/2007   FATIGUE 09/10/2008   GERD 06/08/2007   Headache(784.0) 06/08/2007   HYPERLIPIDEMIA 06/08/2007   HYPERTENSION 06/08/2007   IBS 06/08/2007   Impaired glucose tolerance 02/10/2011   LOW BACK PAIN, CHRONIC 08/12/2010   Nocturia 07/16/2009   PILONIDAL CYST 06/08/2007   PLANTAR FASCIITIS 06/08/2007   Past Surgical History:  Procedure Laterality Date   bladder and prostate surgury     CERVICAL FUSION     COLONOSCOPY  last colon 11/03/2008   w/Brodie=normal exam   pilonidal cyst surgury     Family History  Problem Relation Age of Onset   Heart disease Mother        mature heart disease   Heart attack Paternal Aunt    Kidney disease Other    Colon cancer Neg Hx    Colon polyps Neg Hx  Rectal cancer Neg Hx    Stomach cancer Neg Hx    Social History   Socioeconomic History   Marital status: Divorced    Spouse name: Not on file   Number of children: 3   Years of education: Not on file   Highest education level: Not on file  Occupational History   Occupation: retired Engineering geologist delivery  Tobacco Use   Smoking status: Never   Smokeless tobacco: Never  Vaping Use   Vaping Use: Never used  Substance and Sexual Activity   Alcohol use: No   Drug use: No   Sexual activity: Not on file  Other Topics Concern   Not on file  Social History Narrative   Not on file    Social Determinants of Health   Financial Resource Strain: Low Risk    Difficulty of Paying Living Expenses: Not hard at all  Food Insecurity: No Food Insecurity   Worried About Charity fundraiser in the Last Year: Never true   Arboriculturist in the Last Year: Never true  Transportation Needs: No Transportation Needs   Lack of Transportation (Medical): No   Lack of Transportation (Non-Medical): No  Physical Activity: Not on file  Stress: No Stress Concern Present   Feeling of Stress : Not at all  Social Connections: Moderately Integrated   Frequency of Communication with Friends and Family: More than three times a week   Frequency of Social Gatherings with Friends and Family: Once a week   Attends Religious Services: 1 to 4 times per year   Active Member of [employer] or Organizations: Not on file   Attends Archivist Meetings: 1 to 4 times per year   Marital Status: Divorced    Tobacco Counseling Counseling given: Not Answered   Clinical Intake:  Pre-visit preparation completed: Yes  Pain : No/denies pain Pain Score: 0-No pain     BMI - recorded: 26.3 Nutritional Status: BMI 25 -29 Overweight Nutritional Risks: None Diabetes: Yes CBG done?: No Did pt. bring in CBG monitor from home?: No  How often do you need to have someone help you when you read instructions, pamphlets, or other written materials from your doctor or pharmacy?: 1 - Never What is the last grade level you completed in school?: Bachelor's Degree in History  Diabetic? yes  Interpreter Needed?: No  Information entered by :: Lisette Abu, LPN   Activities of Daily Living    03/20/2022    8:50 AM  In your present state of health, do you have any difficulty performing the following activities:  Hearing? 0  Vision? 0  Difficulty concentrating or making decisions? 0  Walking or climbing stairs? 0  Dressing or bathing? 0  Doing errands, shopping? 0  Preparing Food and eating ? N   Using the Toilet? N  In the past six months, have you accidently leaked urine? N  Do you have problems with loss of bowel control? N  Managing your Medications? N  Managing your Finances? N  Housekeeping or managing your Housekeeping? N    Patient Care Team: Biagio Borg, MD as PCP - General Christain [place], OD as Referring Physician (Optometry)  Indicate any recent Medical Services you may have received from other than [facility] providers in the past year (date may be approximate).     Assessment:   This is a routine wellness examination for Sh[place]om[place].  Hearing/Vision screen No results found.  Dietary issues and exercise activities  discussed: Current Exercise Habits: Home exercise routine, Type of exercise: walking (5 miles per day), Time (Minutes): 60, Frequency (Times/Week): 7, Weekly Exercise (Minutes/Week): 420, Exercise limited by: None identified   Goals Addressed             This Visit's Progress    To maintain my current health status by continuing to eat healthy, stay physically active and socially active.        Depression Screen    03/20/2022    8:49 AM 07/25/2021    8:45 AM 03/14/2021    4:35 PM 01/21/2021    9:55 AM 06/01/2020    9:17 AM 12/02/2019    8:49 AM 05/30/2019    8:57 AM  PHQ 2/9 Scores  PHQ - 2 Score 0 0 0 0 1 1 0    Fall Risk    03/20/2022    8:50 AM 07/25/2021    8:45 AM 03/14/2021    4:37 PM 01/21/2021    9:55 AM 06/01/2020    9:17 AM  Fall Risk   Falls in the past year? 0 0 0 0 1  Number falls in past yr: 0 0 0  0  Injury with Fall? 0 0 0  0  Risk for fall due to : No Fall Risks  No Fall Risks    Follow up Falls evaluation completed  Falls evaluation completed      FALL RISK PREVENTION PERTAINING TO THE HOME:  Any stairs in or around the home? Yes  If so, are there any without handrails? No  Home free of loose throw rugs in walkways, pet beds, electrical cords, etc? Yes  Adequate lighting in your home to reduce risk of falls? Yes   ASSISTIVE  DEVICES UTILIZED TO PREVENT FALLS:  Life alert? No  Use of a cane, walker or w/c? No  Grab bars in the bathroom? Yes  Shower chair or bench in shower? Yes  Elevated toilet seat or a handicapped toilet? Yes   TIMED UP AND GO:  Was the test performed? Yes .  Length of time to ambulate 10 feet: 6 sec.   Gait steady and fast without use of assistive device  Cognitive Function:        03/20/2022    8:50 AM  6CIT Screen  What Year? 0 points  What month? 0 points  What time? 0 points  Count back from 20 0 points  Months in reverse 0 points  Repeat phrase 0 points  Total Score 0 points    Immunizations Immunization History  Administered Date(s) Administered   Fluad Quad(high Dose 65+) 07/03/2020, 07/25/2021   Influenza Split 08/14/2011, 08/02/2012   Influenza Whole 09/16/2004, 08/04/2008, 08/12/2010   Influenza, High Dose Seasonal PF 08/19/2015, 08/01/2017, 08/08/2018   Influenza,inj,Quad PF,6+ Mos 08/06/2013, 08/12/2014, 08/14/2019   PFIZER(Purple Top)SARS-COV-2 Vaccination 12/21/2019, 01/13/2020   Pneumococcal Conjugate-13 08/06/2013   Pneumococcal Polysaccharide-23 09/10/2008, 07/11/2016   Td 10/30/1993, 09/10/2008   Tdap 06/01/2020    TDAP status: Up to date  Flu Vaccine status: Up to date  Pneumococcal vaccine status: Up to date  Covid-19 vaccine status: Completed vaccines  Qualifies for Shingles Vaccine? Yes   Zostavax completed No   Shingrix Completed?: No.    Education has been provided regarding the importance of this vaccine. Patient has been advised to call insurance company to determine out of pocket expense if they have not yet received this vaccine. Advised may also receive vaccine at local pharmacy or Health Dept. Verbalized acceptance  and understanding.  Screening Tests Health Maintenance  Topic Date Due   Zoster Vaccines- Shingrix (1 of 2) Never done   OPHTHALMOLOGY EXAM  08/14/2018   COVID-19 Vaccine (3 - Booster for Pfizer series) 03/09/2020    HEMOGLOBIN A1C  01/22/2022   INFLUENZA VACCINE  05/30/2022   FOOT EXAM  07/25/2022   TETANUS/TDAP  06/01/2030   Pneumonia Vaccine 36+ Years old  Completed   Hepatitis C Screening  Completed   HPV VACCINES  Aged Out   COLONOSCOPY (Pts 45-65yr Insurance coverage will need to be confirmed)  Discontinued    Health Maintenance  Health Maintenance Due  Topic Date Due   Zoster Vaccines- Shingrix (1 of 2) Never done   OPHTHALMOLOGY EXAM  08/14/2018   COVID-19 Vaccine (3 - Booster for Pfizer series) 03/09/2020   HEMOGLOBIN A1C  01/22/2022    Colorectal cancer screening: No longer required.   Lung Cancer Screening: (Low Dose CT Chest recommended if Age 79-80years, 30 pack-year currently smoking OR have quit w/in 15years.) does not qualify.   Lung Cancer Screening Referral: no  Additional Screening:  Hepatitis C Screening: does qualify; Completed 06/01/2020  Vision Screening: Recommended annual ophthalmology exams for early detection of glaucoma and other disorders of the eye. Is the patient up to date with their annual eye exam?  Yes  Who is the provider or what is the name of the office in which the patient attends annual eye exams? Staci Palmer, OD. If pt is not established with a provider, would they like to be referred to a provider to establish care? No .   Dental Screening: Recommended annual dental exams for proper oral hygiene  Community Resource Referral / Chronic Care Management: CRR required this visit?  No   CCM required this visit?  No      Plan:     I have personally reviewed and noted the following in the patient's chart:   Medical and social history Use of alcohol, tobacco or illicit drugs  Current medications and supplements including opioid prescriptions. Patient is currently taking opioid prescriptions. Information provided to patient regarding non-opioid alternatives. Patient advised to discuss non-opioid treatment plan with their provider. Functional  ability and status Nutritional status Physical activity Advanced directives List of other physicians Hospitalizations, surgeries, and ER visits in previous 12 months Vitals Screenings to include cognitive, depression, and falls Referrals and appointments  In addition, I have reviewed and discussed with patient certain preventive protocols, quality metrics, and best practice recommendations. A written personalized care plan for preventive services as well as general preventive health recommendations were provided to patient.     SSheral Flow LPN   [id number]  Nurse Notes: none

## 2022-03-20 NOTE — Patient Instructions (Signed)

## 2022-05-01 ENCOUNTER — Other Ambulatory Visit: Payer: Self-pay | Admitting: Internal Medicine

## 2022-05-15 ENCOUNTER — Encounter (HOSPITAL_COMMUNITY): Payer: Self-pay | Admitting: Cardiology

## 2022-05-23 ENCOUNTER — Telehealth (HOSPITAL_COMMUNITY): Payer: Self-pay | Admitting: Cardiology

## 2022-05-23 NOTE — Telephone Encounter (Signed)
Will route to MD/Nurse to review.  Thank you!

## 2022-05-23 NOTE — Telephone Encounter (Signed)
Just an FYI. We have made several attempts to contact this patient including sending a letter to schedule or reschedule their echocardiogram. We will be removing the patient from the echo [place].  05/15/22 MAILED LETTER [facility]  05/15/22 [facility] to schedule @ 2:38/LBW  05/11/22 [facility] to schedule @ 2:08/LBW  05/01/22'@1542'$  LMOM to cb EVD      Thank you

## 2022-07-07 ENCOUNTER — Other Ambulatory Visit: Payer: Self-pay | Admitting: Internal Medicine

## 2022-08-31 ENCOUNTER — Other Ambulatory Visit: Payer: Self-pay | Admitting: Internal Medicine

## 2022-10-25 ENCOUNTER — Other Ambulatory Visit: Payer: Self-pay | Admitting: Internal Medicine

## 2022-11-20 ENCOUNTER — Other Ambulatory Visit: Payer: Self-pay | Admitting: Internal Medicine

## 2022-11-20 ENCOUNTER — Other Ambulatory Visit: Payer: Self-pay

## 2022-11-20 NOTE — Telephone Encounter (Signed)
Please refill as per office routine med refill policy (all routine meds to be refilled for 3 mo or monthly (per pt preference) up to one year from last visit, then month to month grace period for 3 mo, then further med refills will have to be denied)

## 2022-12-29 DIAGNOSIS — H2513 Age-related nuclear cataract, bilateral: Secondary | ICD-10-CM | POA: Diagnosis not present

## 2023-01-02 ENCOUNTER — Other Ambulatory Visit: Payer: Self-pay | Admitting: Internal Medicine

## 2023-01-02 NOTE — Telephone Encounter (Signed)
Ok to contact pt  [facility] no longer takes the hydrocodone prescriptions  If he wants hydrocodone refill, we would need to send to a different pharmacy

## 2023-01-09 ENCOUNTER — Ambulatory Visit (INDEPENDENT_AMBULATORY_CARE_PROVIDER_SITE_OTHER): Payer: Medicare Other | Admitting: Internal Medicine

## 2023-01-09 ENCOUNTER — Encounter: Payer: Self-pay | Admitting: Internal Medicine

## 2023-01-09 ENCOUNTER — Other Ambulatory Visit: Payer: Self-pay | Admitting: Internal Medicine

## 2023-01-09 VITALS — BP 138/86 | HR 58 | Temp 98.0°F | Ht 68.0 in | Wt 165.0 lb

## 2023-01-09 DIAGNOSIS — E7800 Pure hypercholesterolemia, unspecified: Secondary | ICD-10-CM

## 2023-01-09 DIAGNOSIS — E559 Vitamin D deficiency, unspecified: Secondary | ICD-10-CM

## 2023-01-09 DIAGNOSIS — E1165 Type 2 diabetes mellitus with hyperglycemia: Secondary | ICD-10-CM | POA: Diagnosis not present

## 2023-01-09 DIAGNOSIS — I10 Essential (primary) hypertension: Secondary | ICD-10-CM | POA: Diagnosis not present

## 2023-01-09 DIAGNOSIS — E538 Deficiency of other specified B group vitamins: Secondary | ICD-10-CM | POA: Diagnosis not present

## 2023-01-09 DIAGNOSIS — Z0001 Encounter for general adult medical examination with abnormal findings: Secondary | ICD-10-CM

## 2023-01-09 DIAGNOSIS — M542 Cervicalgia: Secondary | ICD-10-CM | POA: Diagnosis not present

## 2023-01-09 DIAGNOSIS — G8929 Other chronic pain: Secondary | ICD-10-CM

## 2023-01-09 LAB — CBC WITH DIFFERENTIAL/PLATELET
Basophils Absolute: 0.1 10*3/uL (ref 0.0–0.1)
Basophils Relative: 0.9 % (ref 0.0–3.0)
Eosinophils Absolute: 0.1 10*3/uL (ref 0.0–0.7)
Eosinophils Relative: 1.5 % (ref 0.0–5.0)
HCT: 43.6 % (ref 39.0–52.0)
Hemoglobin: 14.7 g/dL (ref 13.0–17.0)
Lymphocytes Relative: 28.2 % (ref 12.0–46.0)
Lymphs Abs: 1.7 10*3/uL (ref 0.7–4.0)
MCHC: 33.7 g/dL (ref 30.0–36.0)
MCV: 91.7 fl (ref 78.0–100.0)
Monocytes Absolute: 0.6 10*3/uL (ref 0.1–1.0)
Monocytes Relative: 9.4 % (ref 3.0–12.0)
Neutro Abs: 3.7 10*3/uL (ref 1.4–7.7)
Neutrophils Relative %: 60 % (ref 43.0–77.0)
Platelets: 260 10*3/uL (ref 150.0–400.0)
RBC: 4.76 Mil/uL (ref 4.22–5.81)
RDW: 13.2 % (ref 11.5–15.5)
WBC: 6.1 10*3/uL (ref 4.0–10.5)

## 2023-01-09 LAB — HEPATIC FUNCTION PANEL
ALT: 11 U/L (ref 0–53)
AST: 21 U/L (ref 0–37)
Albumin: 4.2 g/dL (ref 3.5–5.2)
Alkaline Phosphatase: 65 U/L (ref 39–117)
Bilirubin, Direct: 0.2 mg/dL (ref 0.0–0.3)
Total Bilirubin: 1 mg/dL (ref 0.2–1.2)
Total Protein: 7.3 g/dL (ref 6.0–8.3)

## 2023-01-09 LAB — VITAMIN B12: Vitamin B-12: 291 pg/mL (ref 211–911)

## 2023-01-09 LAB — URINALYSIS, ROUTINE W REFLEX MICROSCOPIC
Bilirubin Urine: NEGATIVE
Hgb urine dipstick: NEGATIVE
Leukocytes,Ua: NEGATIVE
Nitrite: NEGATIVE
RBC / HPF: NONE SEEN (ref 0–?)
Specific Gravity, Urine: 1.025 (ref 1.000–1.030)
Total Protein, Urine: NEGATIVE
Urine Glucose: NEGATIVE
Urobilinogen, UA: 0.2 (ref 0.0–1.0)
pH: 6.5 (ref 5.0–8.0)

## 2023-01-09 LAB — BASIC METABOLIC PANEL
BUN: 16 mg/dL (ref 6–23)
CO2: 26 mEq/L (ref 19–32)
Calcium: 9.8 mg/dL (ref 8.4–10.5)
Chloride: 101 mEq/L (ref 96–112)
Creatinine, Ser: 0.82 mg/dL (ref 0.40–1.50)
GFR: 83.69 mL/min (ref >60.00)
Glucose, Bld: 113 mg/dL — ABNORMAL HIGH (ref 70–99)
Potassium: 4.4 mEq/L (ref 3.5–5.1)
Sodium: 137 mEq/L (ref 135–145)

## 2023-01-09 LAB — LIPID PANEL
Cholesterol: 166 mg/dL (ref 0–200)
HDL: 57.7 mg/dL (ref >39.00)
LDL Cholesterol: 85 mg/dL (ref 0–99)
NonHDL: 108.07
Total CHOL/HDL Ratio: 3
Triglycerides: 113 mg/dL (ref 0.0–149.0)
VLDL: 22.6 mg/dL (ref 0.0–40.0)

## 2023-01-09 LAB — MICROALBUMIN / CREATININE URINE RATIO
Creatinine,U: 125.7 mg/dL
Microalb Creat Ratio: 2.5 mg/g (ref 0.0–30.0)
Microalb, Ur: 3.2 mg/dL — ABNORMAL HIGH (ref 0.0–1.9)

## 2023-01-09 LAB — HEMOGLOBIN A1C: Hgb A1c MFr Bld: 6.3 % (ref 4.6–6.5)

## 2023-01-09 LAB — VITAMIN D 25 HYDROXY (VIT D DEFICIENCY, FRACTURES): VITD: 60.36 ng/mL (ref 30.00–100.00)

## 2023-01-09 LAB — TSH: TSH: 1.66 u[IU]/mL (ref 0.35–5.50)

## 2023-01-09 MED ORDER — HYDROCODONE-ACETAMINOPHEN 10-325 MG PO TABS: ORAL_TABLET | ORAL | 0 refills | Status: DC

## 2023-01-09 NOTE — Progress Notes (Signed)
The test results show that your current treatment is OK, as the tests are stable.  Please continue the same plan.  There is no other need for change of treatment or further evaluation based on these results, at this time.  thanks 

## 2023-01-09 NOTE — Patient Instructions (Addendum)
Your Mychart ID is [id number] ,  and you made the new password today  Please let us know on mychart messaging if you need further vicodin refills; your current prescription was sent to [facility] in [place]  Please have your Shingrix (shingles) shots done at your local pharmacy.  Please continue all other medications as before, and refills have been done if requested.  Please have the pharmacy call with any other refills you may need.  Please continue your efforts at being more active, low cholesterol diet, and weight control.  You are otherwise up to date with prevention measures today.  Please keep your appointments with your specialists as you may have planned  Please go to the LAB at the blood drawing area for the tests to be done  You will be contacted by phone if any changes need to be made immediately.  Otherwise, you will receive a letter about your results with an explanation, but please check with MyChart first.  Please remember to sign up for MyChart if you have not done so, as this will be important to you in the future with finding out test results, communicating by private email, and scheduling acute appointments online when needed.  Please make an Appointment to return in 6 months, or sooner if needed

## 2023-01-09 NOTE — Progress Notes (Unsigned)
Patient ID: David Bolton, male   DOB: 1940-11-26, 82 y.o.   MRN: [id number]         Chief Complaint:: wellness exam and htn, chronic pain, dm, htn, hld       HPI:  David Bolton is a 82 y.o. male here for wellness exam; had good eye exam recently with 20/20 vision without glasses, unusual for age; declines covid booster, but for shingrix at pharmacy, o/w up to date                        Also BP has been well controlled at home.  Walking 5 miles per day, denies any pain except for chronic neck pain but no worsening radicular symptoms.  Pt denies chest pain, increased sob or doe, wheezing, orthopnea, PND, increased LE swelling, palpitations, dizziness or syncope.   Pt denies polydipsia, polyuria, or new focal neuro s/s.    Pt denies fever, wt loss, night sweats, loss of appetite, or other constitutional symptoms     Wt Readings from Last 3 Encounters:  01/09/23 165 lb (74.8 kg)  03/20/22 173 lb (78.5 kg)  03/20/22 173 lb (78.5 kg)   BP Readings from Last 3 Encounters:  01/09/23 138/86  03/20/22 122/70  03/20/22 122/70   Immunization History  Administered Date(s) Administered   Fluad Quad(high Dose 65+) 07/03/2020, 07/25/2021   Influenza Split 08/14/2011, 08/02/2012, 09/07/2022   Influenza Whole 09/16/2004, 08/04/2008, 08/12/2010   Influenza, High Dose Seasonal PF 08/19/2015, 08/01/2017, 08/08/2018   Influenza,inj,Quad PF,6+ Mos 08/06/2013, 08/12/2014, 08/14/2019   PFIZER(Purple Top)SARS-COV-2 Vaccination 12/21/2019, 01/13/2020   Pneumococcal Conjugate-13 08/06/2013   Pneumococcal Polysaccharide-23 09/10/2008, 07/11/2016   Td 10/30/1993, 09/10/2008   Tdap 06/01/2020   There are no preventive care reminders to display for this patient.     Past Medical History:  Diagnosis Date   ALLERGIC RHINITIS 06/08/2007   ANXIETY 06/08/2007   Cervicalgia 12/05/2007   COLONIC POLYPS, HX OF 06/08/2007   Cough 08/12/2010   DEGENERATION, CERVICAL DISC 07/30/2007   DEPENDENCE, ALCOHOL NEC/NOS, IN  REMISSION 06/08/2007   DEPRESSION 06/08/2007   Diabetes mellitus without complication ([place])    DIVERTICULOSIS, COLON 06/08/2007   EPIGASTRIC TENDERNESS 12/05/2007   FATIGUE 09/10/2008   GERD 06/08/2007   Headache(784.0) 06/08/2007   HYPERLIPIDEMIA 06/08/2007   HYPERTENSION 06/08/2007   IBS 06/08/2007   Impaired glucose tolerance 02/10/2011   LOW BACK PAIN, CHRONIC 08/12/2010   Nocturia 07/16/2009   PILONIDAL CYST 06/08/2007   PLANTAR FASCIITIS 06/08/2007   Past Surgical History:  Procedure Laterality Date   bladder and prostate surgury     CERVICAL FUSION     COLONOSCOPY  last colon 11/03/2008   w/Brodie=normal exam   pilonidal cyst surgury      reports that he has never smoked. He has never used smokeless tobacco. He reports that he does not drink alcohol and does not use drugs. family history includes Heart attack in his paternal aunt; Heart disease in his mother; Kidney disease in an other family member. Allergies  Allergen Reactions   Sulfa Antibiotics Rash    Breaks out around private parts   Doxycycline Hyclate    Peanut-Containing Drug Products     Nose bleed, headache   Sulfonamide Derivatives    Current Outpatient Medications on File Prior to Visit  Medication Sig Dispense Refill   amLODipine (NORVASC) 5 MG tablet TAKE ONE TABLET BY MOUTH ONE TIME DAILY 90 tablet 2   aspirin EC 81 MG  EC tablet Take 81 mg by mouth daily.     atorvastatin (LIPITOR) 20 MG tablet TAKE ONE TABLET BY MOUTH ONE TIME DAILY 90 tablet 0   Cholecalciferol (VITAMIN D3 PO) Take 125 mcg by mouth daily.     Ferrous Gluconate-C-Folic Acid (IRON-C PO) Take by mouth.     Glucosamine 500 MG TABS Take by mouth.     lisinopril (ZESTRIL) 40 MG tablet TAKE ONE TABLET BY MOUTH ONE TIME DAILY 90 tablet 1   metFORMIN (GLUCOPHAGE-XR) 500 MG 24 hr tablet TAKE THREE TABLETS BY MOUTH IN THE MORNING WITH BREAKFAST 270 tablet 2   metoprolol succinate (TOPROL-XL) 50 MG 24 hr tablet TAKE HALF TABLET BY MOUTH DAILY 90 tablet 1   Multiple  Vitamin (MULTIVITAMIN) capsule Take 1 capsule by mouth daily.     Saw Palmetto 450 MG CAPS Take 2 capsules by mouth daily.     No current facility-administered medications on file prior to visit.        ROS:  All others reviewed and negative.  Objective        PE:  BP 138/86 (BP Location: Left Arm, Patient Position: Sitting, Cuff Size: Large)   Pulse (!) 58   Temp 98 F (36.7 C) (Oral)   Ht '5\' 8"'$  (1.727 m)   Wt 165 lb (74.8 kg)   SpO2 98%   BMI 25.09 kg/m                 Constitutional: Pt appears in NAD               HENT: Head: NCAT.                Right Ear: External ear normal.                 Left Ear: External ear normal.                Eyes: . Pupils are equal, round, and reactive to light. Conjunctivae and EOM are normal               Nose: without d/c or deformity               Neck: Neck supple. Gross normal ROM               Cardiovascular: Normal rate and regular rhythm.                 Pulmonary/Chest: Effort normal and breath sounds without rales or wheezing.                Abd:  Soft, NT, ND, + BS, no organomegaly               Neurological: Pt is alert. At baseline orientation, motor grossly intact               Skin: Skin is warm. No rashes, no other new lesions, LE edema - none               Psychiatric: Pt behavior is normal without agitation   Micro: none  Cardiac tracings I have personally interpreted today:  none  Pertinent Radiological findings (summarize): none   Lab Results  Component Value Date   WBC 6.1 01/09/2023   HGB 14.7 01/09/2023   HCT 43.6 01/09/2023   PLT 260.0 01/09/2023   GLUCOSE 113 (H) 01/09/2023   CHOL 166 01/09/2023   TRIG 113.0 01/09/2023   HDL 57.70 01/09/2023  LDLDIRECT 139.0 02/10/2011   LDLCALC 85 01/09/2023   ALT 11 01/09/2023   AST 21 01/09/2023   NA 137 01/09/2023   K 4.4 01/09/2023   CL 101 01/09/2023   CREATININE 0.82 01/09/2023   BUN 16 01/09/2023   CO2 26 01/09/2023   TSH 1.66 01/09/2023   PSA 3.13  03/20/2022   HGBA1C 6.3 01/09/2023   MICROALBUR 3.2 (H) 01/09/2023   Assessment/Plan:  [name] is a 82 y.o. White or Caucasian [1] male with  has a past medical history of ALLERGIC RHINITIS (06/08/2007), ANXIETY (06/08/2007), Cervicalgia (12/05/2007), COLONIC POLYPS, HX OF (06/08/2007), Cough (08/12/2010), DEGENERATION, CERVICAL DISC (07/30/2007), DEPENDENCE, ALCOHOL NEC/NOS, IN REMISSION (06/08/2007), DEPRESSION (06/08/2007), Diabetes mellitus without complication ([place]), DIVERTICULOSIS, COLON (06/08/2007), EPIGASTRIC TENDERNESS (12/05/2007), FATIGUE (09/10/2008), GERD (06/08/2007), Headache(784.0) (06/08/2007), HYPERLIPIDEMIA (06/08/2007), HYPERTENSION (06/08/2007), IBS (06/08/2007), Impaired glucose tolerance (02/10/2011), LOW BACK PAIN, CHRONIC (08/12/2010), Nocturia (07/16/2009), PILONIDAL CYST (06/08/2007), and PLANTAR FASCIITIS (06/08/2007).  Encounter for well adult exam with abnormal findings Age and sex appropriate education and counseling updated with regular exercise and diet Referrals for preventative services - none needed Immunizations addressed - decliens covid booster, for shingrix at pharmacy Smoking counseling  - none needed Evidence for depression or other mood disorder - none significant Most recent labs reviewed. I have personally reviewed and have noted: 1) the patient's medical and social history 2) The patient's current medications and supplements 3) The patient's height, weight, and BMI have been recorded in the chart   Chronic neck pain Chronic stable, for vicodin prn refill, declines surgical re eval  Diabetes ([place]) Lab Results  Component Value Date   HGBA1C 6.3 01/09/2023   Stable, pt to continue current medical treatment metformin ER 500 mg - 3 qd   Essential hypertension BP Readings from Last 3 Encounters:  01/09/23 138/86  03/20/22 122/70  03/20/22 122/70   Stable, pt to continue medical treatment norvasc 5 mg qd, lisionpril 40 qd, toprol xl 50 qd   Hyperlipidemia Lab  Results  Component Value Date   LDLCALC 85 01/09/2023   Uncontrolled, , pt to cont lipitor 20 mg qd, for lower chol diet, declines other change for now  Followup: Return in about 6 months (around 07/12/2023).  Cathlean Cower, MD 01/11/2023 9:25 PM [facility]

## 2023-01-11 ENCOUNTER — Encounter: Payer: Self-pay | Admitting: Internal Medicine

## 2023-01-11 NOTE — Assessment & Plan Note (Addendum)
Chronic stable, for vicodin prn refill, declines surgical re eval

## 2023-01-11 NOTE — Assessment & Plan Note (Signed)
Lab Results  Component Value Date   LDLCALC 85 01/09/2023   Uncontrolled, , pt to cont lipitor 20 mg qd, for lower chol diet, declines other change for now

## 2023-01-11 NOTE — Assessment & Plan Note (Signed)
BP Readings from Last 3 Encounters:  01/09/23 138/86  03/20/22 122/70  03/20/22 122/70   Stable, pt to continue medical treatment norvasc 5 mg qd, lisionpril 40 qd, toprol xl 50 qd

## 2023-01-11 NOTE — Assessment & Plan Note (Signed)
Age and sex appropriate education and counseling updated with regular exercise and diet Referrals for preventative services - none needed Immunizations addressed - decliens covid booster, for shingrix at pharmacy Smoking counseling  - none needed Evidence for depression or other mood disorder - none significant Most recent labs reviewed. I have personally reviewed and have noted: 1) the patient's medical and social history 2) The patient's current medications and supplements 3) The patient's height, weight, and BMI have been recorded in the chart

## 2023-01-11 NOTE — Assessment & Plan Note (Signed)
Lab Results  Component Value Date   HGBA1C 6.3 01/09/2023   Stable, pt to continue current medical treatment metformin ER 500 mg - 3 qd

## 2023-02-23 ENCOUNTER — Telehealth: Payer: Self-pay | Admitting: Internal Medicine

## 2023-02-23 MED ORDER — HYDROCODONE-ACETAMINOPHEN 10-325 MG PO TABS: ORAL_TABLET | ORAL | 0 refills | Status: DC

## 2023-02-23 NOTE — Telephone Encounter (Signed)
Patient called and said the [facility] can't refill his HYDROcodone-acetaminophen (NORCO) 10-325 MG tablet without a new prescription. He would like to know if Dr. Jonny Ruiz can send them a new prescription. Best callback is [phone].

## 2023-02-23 NOTE — Telephone Encounter (Signed)
Done erx 

## 2023-02-23 NOTE — Telephone Encounter (Signed)
Called Pt and left voicemail letting him know it has been sent.

## 2023-03-23 ENCOUNTER — Emergency Department: Payer: Medicare Other

## 2023-03-23 ENCOUNTER — Emergency Department
Admission: EM | Admit: 2023-03-23 | Discharge: 2023-03-23 | Disposition: A | Discharge status: Home / Self Care | Payer: Medicare Other | Attending: Emergency Medicine | Admitting: Emergency Medicine

## 2023-03-23 ENCOUNTER — Other Ambulatory Visit: Payer: Self-pay

## 2023-03-23 DIAGNOSIS — R262 Difficulty in walking, not elsewhere classified: Secondary | ICD-10-CM | POA: Diagnosis not present

## 2023-03-23 DIAGNOSIS — I10 Essential (primary) hypertension: Secondary | ICD-10-CM | POA: Diagnosis not present

## 2023-03-23 DIAGNOSIS — S0990XA Unspecified injury of head, initial encounter: Secondary | ICD-10-CM | POA: Diagnosis not present

## 2023-03-23 DIAGNOSIS — S199XXA Unspecified injury of neck, initial encounter: Secondary | ICD-10-CM | POA: Diagnosis not present

## 2023-03-23 DIAGNOSIS — Z043 Encounter for examination and observation following other accident: Secondary | ICD-10-CM | POA: Diagnosis not present

## 2023-03-23 DIAGNOSIS — R4182 Altered mental status, unspecified: Secondary | ICD-10-CM | POA: Diagnosis present

## 2023-03-23 DIAGNOSIS — G8929 Other chronic pain: Secondary | ICD-10-CM | POA: Diagnosis not present

## 2023-03-23 DIAGNOSIS — W01198A Fall on same level from slipping, tripping and stumbling with subsequent striking against other object, initial encounter: Secondary | ICD-10-CM | POA: Insufficient documentation

## 2023-03-23 DIAGNOSIS — W19XXXA Unspecified fall, initial encounter: Secondary | ICD-10-CM

## 2023-03-23 DIAGNOSIS — M542 Cervicalgia: Secondary | ICD-10-CM | POA: Insufficient documentation

## 2023-03-23 DIAGNOSIS — E119 Type 2 diabetes mellitus without complications: Secondary | ICD-10-CM | POA: Insufficient documentation

## 2023-03-23 DIAGNOSIS — R519 Headache, unspecified: Secondary | ICD-10-CM | POA: Diagnosis not present

## 2023-03-23 LAB — URINE DRUG SCREEN, QUALITATIVE (ARMC ONLY)
Amphetamines, Ur Screen: NOT DETECTED
Barbiturates, Ur Screen: NOT DETECTED
Benzodiazepine, Ur Scrn: NOT DETECTED
Cannabinoid 50 Ng, Ur ~~LOC~~: NOT DETECTED
Cocaine Metabolite,Ur ~~LOC~~: NOT DETECTED
MDMA (Ecstasy)Ur Screen: NOT DETECTED
Methadone Scn, Ur: NOT DETECTED
Opiate, Ur Screen: POSITIVE — AB
Phencyclidine (PCP) Ur S: NOT DETECTED
Tricyclic, Ur Screen: NOT DETECTED

## 2023-03-23 LAB — CBC WITH DIFFERENTIAL/PLATELET
Abs Immature Granulocytes: 0.01 10*3/uL (ref 0.00–0.07)
Basophils Absolute: 0 10*3/uL (ref 0.0–0.1)
Basophils Relative: 1 %
Eosinophils Absolute: 0.1 10*3/uL (ref 0.0–0.5)
Eosinophils Relative: 2 %
HCT: 42 % (ref 39.0–52.0)
Hemoglobin: 13.8 g/dL (ref 13.0–17.0)
Immature Granulocytes: 0 %
Lymphocytes Relative: 23 %
Lymphs Abs: 1.4 10*3/uL (ref 0.7–4.0)
MCH: 30.1 pg (ref 26.0–34.0)
MCHC: 32.9 g/dL (ref 30.0–36.0)
MCV: 91.7 fL (ref 80.0–100.0)
Monocytes Absolute: 0.6 10*3/uL (ref 0.1–1.0)
Monocytes Relative: 9 %
Neutro Abs: 4.1 10*3/uL (ref 1.7–7.7)
Neutrophils Relative %: 65 %
Platelets: 226 10*3/uL (ref 150–400)
RBC: 4.58 MIL/uL (ref 4.22–5.81)
RDW: 12.2 % (ref 11.5–15.5)
WBC: 6.2 10*3/uL (ref 4.0–10.5)
nRBC: 0 % (ref 0.0–0.2)

## 2023-03-23 LAB — LACTIC ACID, PLASMA: Lactic Acid, Venous: 1.5 mmol/L (ref 0.5–1.9)

## 2023-03-23 LAB — COMPREHENSIVE METABOLIC PANEL
ALT: 12 U/L (ref 0–44)
AST: 22 U/L (ref 15–41)
Albumin: 3.8 g/dL (ref 3.5–5.0)
Alkaline Phosphatase: 51 U/L (ref 38–126)
Anion gap: 6 (ref 5–15)
BUN: 15 mg/dL (ref 8–23)
CO2: 23 mmol/L (ref 22–32)
Calcium: 8.3 mg/dL — ABNORMAL LOW (ref 8.9–10.3)
Chloride: 107 mmol/L (ref 98–111)
Creatinine, Ser: 0.74 mg/dL (ref 0.61–1.24)
GFR, Estimated: >60 mL/min (ref >60)
Glucose, Bld: 161 mg/dL — ABNORMAL HIGH (ref 70–99)
Potassium: 3.4 mmol/L — ABNORMAL LOW (ref 3.5–5.1)
Sodium: 136 mmol/L (ref 135–145)
Total Bilirubin: 0.9 mg/dL (ref 0.3–1.2)
Total Protein: 6.4 g/dL — ABNORMAL LOW (ref 6.5–8.1)

## 2023-03-23 LAB — URINALYSIS, W/ REFLEX TO CULTURE (INFECTION SUSPECTED)
Bacteria, UA: NONE SEEN
Bilirubin Urine: NEGATIVE
Glucose, UA: NEGATIVE mg/dL
Hgb urine dipstick: NEGATIVE
Ketones, ur: 5 mg/dL — AB
Leukocytes,Ua: NEGATIVE
Nitrite: NEGATIVE
Protein, ur: NEGATIVE mg/dL
Specific Gravity, Urine: 1.02 (ref 1.005–1.030)
Squamous Epithelial / HPF: NONE SEEN /HPF (ref 0–5)
pH: 6 (ref 5.0–8.0)

## 2023-03-23 LAB — T4, FREE: Free T4: 0.7 ng/dL (ref 0.61–1.12)

## 2023-03-23 LAB — TSH: TSH: 1.431 u[IU]/mL (ref 0.350–4.500)

## 2023-03-23 LAB — MAGNESIUM: Magnesium: 1.8 mg/dL (ref 1.7–2.4)

## 2023-03-23 LAB — TROPONIN I (HIGH SENSITIVITY): Troponin I (High Sensitivity): 9 ng/L (ref <18)

## 2023-03-23 LAB — ETHANOL: Alcohol, Ethyl (B): <10 mg/dL (ref <10)

## 2023-03-23 NOTE — ED Triage Notes (Addendum)
Per EMS, Pt, from home, presents d/t AMS.  Management at apartment complex called EMS d/t bizarre behavior.  They stated the Pt was walking all over the complex last night messing with trash cans.    Pt does not remember walking around but believes he fell and hit head on wood.  No head injury noted.  Also, Pt admits to sleeping outside last night.  Pt c/o posterior neck pain and sts it has been "popping" frequently.  Pain score 5/10.  Hx of previous neck surgery.    Pt lives alone.  Pt continued to ramble while this Clinical research associate was completing triage questions and ended up stating he has had a few falls recently.  Sts he has gotten "disoriented" a few times recently.

## 2023-03-23 NOTE — ED Notes (Signed)
Patient transported to CT 

## 2023-03-23 NOTE — ED Notes (Signed)
Pt is aware we need a urine sample.  Urinal provided.  

## 2023-03-23 NOTE — ED Provider Notes (Signed)
[facility] Provider Note    Event Date/Time   First MD Initiated Contact with Patient 03/23/23 1548     (approximate)   History   Altered Mental Status and Neck Pain   HPI  David Bolton is a 82 y.o. male   Past medical history of cervical spine fusion remotely, chronic neck pain on chronic opioids, type II diabetic, anxiety, etoh use in remission, who presents to the emergency department with multiple falls in the recent past and altered mental status.  He reportedly comes by EMS from home with altered mental status management as apartment complex called the ambulance due to bizarre behavior, reportedly walking all over the complex last night messing with trash cans.  He vaguely remembers doing this, and he notes that he stumbled backwards and fell and hit his head on the wood floor.  He also remembers sleeping outside last night.  He has a history of chronic neck pain and feels that it has been hurting more recently "popping sensation".  He lives alone.  He denies any drug or alcohol use.  He states that he had another fall approximately 2 weeks ago when he stumbled and hit his head.  He states that he slipped on a puddle.  Currently he is awake alert oriented fully and has no acute medical complaints except for some chronic neck pain.  Denies any other acute medical complaints including focal infectious symptoms like respiratory symptoms, GI or GU complaints.  Independent Historian: I spoke with his daughter Andi Hence on the phone he knows his father well lives in the town over and sees him frequently.  She notes over the last several months that her father has been having increasing memory issues, slightly worsening mobility issues, is otherwise very independent and active.  States that to the best of her knowledge he has been sober and not using alcohol for over 20 years.  External Medical Documents Reviewed: Cervical spine x-ray from 03/11/2018 that shows  fusion across C4-C5-C6 disc spaces      Physical Exam   Triage Vital Signs: ED Triage Vitals  Enc Vitals Group     BP 03/23/23 1550 (!) 152/89     Pulse Rate 03/23/23 1550 73     Resp 03/23/23 1550 14     Temp 03/23/23 1557 97.7 F (36.5 C)     Temp Source 03/23/23 1557 Oral     SpO2 03/23/23 1547 97 %     Weight 03/23/23 1558 162 lb (73.5 kg)     Height 03/23/23 1558 5\' 8"  (1.727 m)     Head Circumference --      Peak Flow --      Pain Score 03/23/23 1558 5     Pain Loc --      Pain Edu? --      Excl. in GC? --     Most recent vital signs: Vitals:   03/23/23 1800 03/23/23 1830  BP: (!) 178/109 (!) 162/97  Pulse: (!) 55 (!) 57  Resp: 11 10  Temp:    SpO2: 100% 100%    General: Awake, no distress.  CV:  Good peripheral perfusion.  Resp:  Normal effort.  Abd:  No distention.  Other:  Awake alert oriented comfortable appearing slightly hypertensive otherwise vital signs within normal limits.  Nontoxic appearance.  Neck is supple with full range of motion no deformity or midline tenderness on palpation.  No obvious signs of external head trauma.  He has  clear lungs and a soft nontender abdomen is moving all extremities with full active range of motion normal finger-to-nose no facial asymmetry and has a steady gait at this time.  He does not have any signs of acute intoxication nor acute withdrawal.   ED Results / Procedures / Treatments   Labs (all labs ordered are listed, but only abnormal results are displayed) Labs Reviewed  COMPREHENSIVE METABOLIC PANEL - Abnormal; Notable for the following components:      Result Value   Potassium 3.4 (*)    Glucose, Bld 161 (*)    Calcium 8.3 (*)    Total Protein 6.4 (*)    All other components within normal limits  URINALYSIS, W/ REFLEX TO CULTURE (INFECTION SUSPECTED) - Abnormal; Notable for the following components:   Color, Urine YELLOW (*)    APPearance CLEAR (*)    Ketones, ur 5 (*)    All other components within  normal limits  URINE DRUG SCREEN, QUALITATIVE ([facility] ONLY) - Abnormal; Notable for the following components:   Opiate, Ur Screen POSITIVE (*)    All other components within normal limits  ETHANOL  LACTIC ACID, PLASMA  CBC WITH DIFFERENTIAL/PLATELET  TSH  T4, FREE  MAGNESIUM  TROPONIN I (HIGH SENSITIVITY)     I ordered and reviewed the above labs they are notable for normal cell counts  EKG  ED ECG REPORT I, Pilar Jarvis, the attending physician, personally viewed and interpreted this ECG.   Date: 03/23/2023  EKG Time: 1608  Rate: 61  Rhythm: sinus  Axis: nl  Intervals:none  ST&T Change: no stemi    RADIOLOGY I independently reviewed and interpreted CT scan of the head see no obvious bleeding or midline shift  PROCEDURES:  Critical Care performed: No  Procedures   MEDICATIONS ORDERED IN ED: Medications - No data to display  IMPRESSION / MDM / ASSESSMENT AND PLAN / ED COURSE  I reviewed the triage vital signs and the nursing notes.                                Patient's presentation is most consistent with acute presentation with potential threat to life or bodily function.  Differential diagnosis includes, but is not limited to, altered mental status due to electrolyte derangement, infection, thyroid dysfunction, intracranial bleeding, neck pain due to fracture or dislocation, consider intoxication alcohol use   The patient is on the cardiac monitor to evaluate for evidence of arrhythmia and/or significant heart rate changes.  MDM:    This is a patient with bizarre behavior last night and frequent falls with a history of worsening mobility/cognitive issues per daughter who I spoke with over the phone.  He is awake alert oriented with no acute complaints at this time with some chronic neck pain.  I will CT his head and neck to check for any intracranial bleeding or C-spine fractures or dislocations from his recent falls.  He has no focal neurologic deficits  to suggest a CVA.  He has no focal infectious symptoms to suggest infection, and he has no signs of meningitis.  Will check basic labs as well as infectious workup chest x-ray and urinalysis.  If all of the above is normal and he remains awake alert oriented plan will be for discharge at this time and close follow-up with PMD.  This may be a result of dementia given the cognitive/mobility declines subacutely reports from daughter and perhaps  an element of sundowning given his behaviors in the evening.  However if emergent pathologies evaluation today is negative he should follow-up with his PMD for further workup of these potential diagnoses.      FINAL CLINICAL IMPRESSION(S) / ED DIAGNOSES   Final diagnoses:  Fall, initial encounter  Altered mental status, unspecified altered mental status type     Rx / DC Orders   ED Discharge Orders     None        Note:  This document was prepared using Dragon voice recognition software and may include unintentional dictation errors.    Pilar Jarvis, MD 03/23/23 [phone]

## 2023-03-23 NOTE — ED Notes (Signed)
Pt verbalized understanding of discharge instructions. Opportunity for questions provided. Pt's daughter is on her way to come and get him.

## 2023-03-23 NOTE — Discharge Instructions (Addendum)
It is unclear what caused your behaviors last night.  However since you feel well and all your testing number department showed no emergent conditions that require surgery or hospitalization, you were discharged from the emergency department.  Is important that you follow-up with your primary doctor this week for checkup.  Please note that if you experience any new, worsening, unexpected symptoms [place] to the emergency permit for recheck.

## 2023-04-03 ENCOUNTER — Ambulatory Visit (INDEPENDENT_AMBULATORY_CARE_PROVIDER_SITE_OTHER): Payer: Medicare Other

## 2023-04-03 VITALS — BP 122/70 | HR 61 | Temp 98.8°F | Resp 16 | Ht 68.0 in | Wt 161.6 lb

## 2023-04-03 DIAGNOSIS — Z0000 Encounter for general adult medical examination without abnormal findings: Secondary | ICD-10-CM

## 2023-04-03 NOTE — Progress Notes (Addendum)
Subjective:   David Bolton is a 82 y.o. male who presents for Medicare Annual/Subsequent preventive examination.  Review of Systems     Cardiac Risk Factors include: advanced age (>18men, >55 women);family history of premature cardiovascular disease;hypertension;dyslipidemia;male gender     Objective:    Today's Vitals   04/03/23 1113  BP: 122/70  Pulse: 61  Resp: 16  Temp: 98.8 F (37.1 C)  TempSrc: Temporal  SpO2: 97%  Weight: 161 lb 9.6 oz (73.3 kg)  Height: 5\' 8"  (1.727 m)  PainSc: 4   PainLoc: Neck   Body mass index is 24.57 kg/m.     04/03/2023   11:15 AM 03/23/2023    4:00 PM 03/14/2021    4:37 PM 01/02/2017    9:46 AM  Advanced Directives  Does Patient Have a Medical Advance Directive? No No Yes Yes  Type of Advance Directive   Living will;Healthcare Power of Attorney   Does patient want to make changes to medical advance directive?   No - Patient declined Yes (ED - Information included in AVS)  Copy of Healthcare Power of Attorney in Chart?   No - copy requested   Would patient like information on creating a medical advance directive? No - Patient declined No - Patient declined      Current Medications (verified) Outpatient Encounter Medications as of 04/03/2023  Medication Sig   amLODipine (NORVASC) 5 MG tablet TAKE ONE TABLET BY MOUTH ONE TIME DAILY   aspirin EC 81 MG EC tablet Take 81 mg by mouth daily.   atorvastatin (LIPITOR) 20 MG tablet TAKE ONE TABLET BY MOUTH ONE TIME DAILY   Cholecalciferol (VITAMIN D3 PO) Take 125 mcg by mouth daily.   Ferrous Gluconate-C-Folic Acid (IRON-C PO) Take by mouth.   Glucosamine 500 MG TABS Take by mouth.   HYDROcodone-acetaminophen (NORCO) 10-325 MG tablet TAKE HALF TO ONE TABLET BY MOUTH AT BEDTIME AS NEEDED   lisinopril (ZESTRIL) 40 MG tablet TAKE ONE TABLET BY MOUTH ONE TIME DAILY   metFORMIN (GLUCOPHAGE-XR) 500 MG 24 hr tablet TAKE THREE TABLETS BY MOUTH IN THE MORNING WITH BREAKFAST   metoprolol succinate  (TOPROL-XL) 50 MG 24 hr tablet TAKE HALF TABLET BY MOUTH DAILY   Multiple Vitamin (MULTIVITAMIN) capsule Take 1 capsule by mouth daily.   Saw Palmetto 450 MG CAPS Take 2 capsules by mouth daily.   No facility-administered encounter medications on file as of 04/03/2023.    Allergies (verified) Sulfa antibiotics, Doxycycline hyclate, Peanut-containing drug products, and Sulfonamide derivatives   History: Past Medical History:  Diagnosis Date   ALLERGIC RHINITIS 06/08/2007   ANXIETY 06/08/2007   Cervicalgia 12/05/2007   COLONIC POLYPS, HX OF 06/08/2007   Cough 08/12/2010   DEGENERATION, CERVICAL DISC 07/30/2007   DEPENDENCE, ALCOHOL NEC/NOS, IN REMISSION 06/08/2007   DEPRESSION 06/08/2007   Diabetes mellitus without complication (HCC)    DIVERTICULOSIS, COLON 06/08/2007   EPIGASTRIC TENDERNESS 12/05/2007   FATIGUE 09/10/2008   GERD 06/08/2007   Headache(784.0) 06/08/2007   HYPERLIPIDEMIA 06/08/2007   HYPERTENSION 06/08/2007   IBS 06/08/2007   Impaired glucose tolerance 02/10/2011   LOW BACK PAIN, CHRONIC 08/12/2010   Nocturia 07/16/2009   PILONIDAL CYST 06/08/2007   PLANTAR FASCIITIS 06/08/2007   Past Surgical History:  Procedure Laterality Date   bladder and prostate surgury     CERVICAL FUSION     COLONOSCOPY  last colon 11/03/2008   w/Brodie=normal exam   pilonidal cyst surgury     Family History  Problem Relation  Age of Onset   Heart disease Mother        mature heart disease   Heart attack Paternal Aunt    Kidney disease Other    Colon cancer Neg Hx    Colon polyps Neg Hx    Rectal cancer Neg Hx    Stomach cancer Neg Hx    Social History   Socioeconomic History   Marital status: Divorced    Spouse name: Not on file   Number of children: 3   Years of education: Not on file   Highest education level: Not on file  Occupational History   Occupation: retired Development worker, international aid delivery  Tobacco Use   Smoking status: Never   Smokeless tobacco: Never  Vaping Use   Vaping Use:  Never used  Substance and Sexual Activity   Alcohol use: No   Drug use: No   Sexual activity: Not on file  Other Topics Concern   Not on file  Social History Narrative   Not on file   Social Determinants of Health   Financial Resource Strain: Low Risk  (04/03/2023)   Overall Financial Resource Strain (CARDIA)    Difficulty of Paying Living Expenses: Not hard at all  Food Insecurity: No Food Insecurity (04/03/2023)   Hunger Vital Sign    Worried About Running Out of Food in the Last Year: Never true    Ran Out of Food in the Last Year: Never true  Transportation Needs: No Transportation Needs (04/03/2023)   PRAPARE - Administrator, Civil Service (Medical): No    Lack of Transportation (Non-Medical): No  Physical Activity: Sufficiently Active (04/03/2023)   Exercise Vital Sign    Days of Exercise per Week: 5 days    Minutes of Exercise per Session: 30 min  Stress: No Stress Concern Present (04/03/2023)   [employer] of Occupational Health - Occupational Stress Questionnaire    Feeling of Stress : Not at all  Social Connections: Moderately Integrated (04/03/2023)   Social Connection and Isolation Panel [NHANES]    Frequency of Communication with Friends and Family: More than three times a week    Frequency of Social Gatherings with Friends and Family: Once a week    Attends Religious Services: 1 to 4 times per year    Active Member of [employer] or Organizations: Yes    Attends Banker Meetings: 1 to 4 times per year    Marital Status: Divorced    Tobacco Counseling Counseling given: Not Answered   Clinical Intake:  Pre-visit preparation completed: Yes  Pain : No/denies pain Pain Score: 4  Pain Type: Chronic pain Pain Location: Neck     BMI - recorded: 24.57 Nutritional Status: BMI of 19-24  Normal Nutritional Risks: None Diabetes: No  How often do you need to have someone help you when you read instructions, pamphlets, or other written materials  from your doctor or pharmacy?: 1 - Never What is the last grade level you completed in school?: Master's Degree in History  Nutrition Risk Assessment:  Has the patient had any N/V/D within the last 2 months?  No  Does the patient have any non-healing wounds?  No  Has the patient had any unintentional weight loss or weight gain?  No   Diabetes:  Is the patient diabetic?  Yes  If diabetic, was a CBG obtained today?  No  Did the patient bring in their glucometer from home?  No  How often do you  monitor your CBG's? No.   Financial Strains and Diabetes Management:  Are you having any financial strains with the device, your supplies or your medication? No .  Does the patient want to be seen by Chronic Care Management for management of their diabetes?  No  Would the patient like to be referred to a Nutritionist or for Diabetic Management?  No   Diabetic Exams:  Diabetic Eye Exam: Completed 12/29/2022 Diabetic Foot Exam: Completed 01/09/2023   Interpreter Needed?: No  Information entered by :: Sandia Pfund N. Marijane Trower, LPN.   Activities of Daily Living    04/03/2023   11:15 AM  In your present state of health, do you have any difficulty performing the following activities:  Hearing? 0  Vision? 0  Difficulty concentrating or making decisions? 0  Walking or climbing stairs? 0  Dressing or bathing? 0  Doing errands, shopping? 0  Preparing Food and eating ? N  Using the Toilet? N  In the past six months, have you accidently leaked urine? N  Do you have problems with loss of bowel control? N  Managing your Medications? N  Managing your Finances? N  Housekeeping or managing your Housekeeping? N    Patient Care Team: Corwin Levins, MD as PCP - General Tobias Alexander, OD as Referring Physician (Optometry)  Indicate any recent Medical Services you may have received from other than [facility] providers in the past year (date may be approximate).     Assessment:   This is a routine wellness  examination for Cace.  Hearing/Vision screen Hearing Screening - Comments:: Denies hearing difficulties   Vision Screening - Comments:: Wears rx glasses - up to date with routine eye exams with [facility]   Dietary issues and exercise activities discussed: Current Exercise Habits: Home exercise routine, Type of exercise: walking, Time (Minutes): 30, Frequency (Times/Week): 7, Weekly Exercise (Minutes/Week): 210, Intensity: Moderate, Exercise limited by: None identified   Goals Addressed             This Visit's Progress    My goal  is to gain some weight at least 5 pounds.        Depression Screen    04/03/2023   11:14 AM 01/09/2023   11:44 AM 03/20/2022    8:49 AM 07/25/2021    8:45 AM 03/14/2021    4:35 PM 01/21/2021    9:55 AM 06/01/2020    9:17 AM  PHQ 2/9 Scores  PHQ - 2 Score 0 1 0 0 0 0 1  PHQ- 9 Score 1          Fall Risk    04/03/2023   11:15 AM 01/09/2023   11:44 AM 03/20/2022    8:50 AM 07/25/2021    8:45 AM 03/14/2021    4:37 PM  Fall Risk   Falls in the past year? 0 0 0 0 0  Number falls in past yr: 0 0 0 0 0  Injury with Fall? 0 0 0 0 0  Risk for fall due to : No Fall Risks  No Fall Risks  No Fall Risks  Follow up Falls prevention discussed  Falls evaluation completed  Falls evaluation completed    FALL RISK PREVENTION PERTAINING TO THE HOME:  Any stairs in or around the home? Yes  If so, are there any without handrails? No  Home free of loose throw rugs in walkways, pet beds, electrical cords, etc? Yes  Adequate lighting in your home to reduce risk of falls?  Yes   ASSISTIVE DEVICES UTILIZED TO PREVENT FALLS:  Life alert? No  Use of a cane, walker or w/c? No  Grab bars in the bathroom? Yes  Shower chair or bench in shower? Yes  Elevated toilet seat or a handicapped toilet? Yes   TIMED UP AND GO:  Was the test performed? Yes .  Length of time to ambulate 10 feet: 8 sec.   Gait steady and fast without use of assistive device  Cognitive  Function:        04/03/2023   11:15 AM 03/20/2022    8:50 AM  6CIT Screen  What Year? 0 points 0 points  What month? 0 points 0 points  What time? 0 points 0 points  Count back from 20 0 points 0 points  Months in reverse 0 points 0 points  Repeat phrase 0 points 0 points  Total Score 0 points 0 points    Immunizations Immunization History  Administered Date(s) Administered   Fluad Quad(high Dose 65+) 07/03/2020, 07/25/2021   Influenza Split 08/14/2011, 08/02/2012, 09/07/2022   Influenza Whole 09/16/2004, 08/04/2008, 08/12/2010   Influenza, High Dose Seasonal PF 08/19/2015, 08/01/2017, 08/08/2018   Influenza,inj,Quad PF,6+ Mos 08/06/2013, 08/12/2014, 08/14/2019   PFIZER(Purple Top)SARS-COV-2 Vaccination 12/21/2019, 01/13/2020   Pneumococcal Conjugate-13 08/06/2013   Pneumococcal Polysaccharide-23 09/10/2008, 07/11/2016   Td 10/30/1993, 09/10/2008   Tdap 06/01/2020    TDAP status: Up to date  Flu Vaccine status: Up to date  Pneumococcal vaccine status: Up to date  Covid-19 vaccine status: Completed vaccines  Qualifies for Shingles Vaccine? Yes   Zostavax completed No   Shingrix Completed?: No.    Education has been provided regarding the importance of this vaccine. Patient has been advised to call insurance company to determine out of pocket expense if they have not yet received this vaccine. Advised may also receive vaccine at local pharmacy or Health Dept. Verbalized acceptance and understanding.  Screening Tests Health Maintenance  Topic Date Due   COVID-19 Vaccine (3 - 2023-24 season) 06/30/2022   Zoster Vaccines- Shingrix (1 of 2) 04/11/2023 (Originally 10/01/1993)   INFLUENZA VACCINE  05/31/2023   HEMOGLOBIN A1C  07/12/2023   OPHTHALMOLOGY EXAM  12/29/2023   Diabetic kidney evaluation - Urine ACR  01/09/2024   FOOT EXAM  01/09/2024   Diabetic kidney evaluation - eGFR measurement  03/22/2024   Medicare Annual Wellness (AWV)  04/02/2024   DTaP/Tdap/Td (4 - Td or  Tdap) 06/01/2030   Pneumonia Vaccine 55+ Years old  Completed   Hepatitis C Screening  Completed   HPV VACCINES  Aged Out   Colonoscopy  Discontinued    Health Maintenance  Health Maintenance Due  Topic Date Due   COVID-19 Vaccine (3 - 2023-24 season) 06/30/2022    Colorectal cancer screening: No longer required.   Lung Cancer Screening: (Low Dose CT Chest recommended if Age 46-80 years, 30 pack-year currently smoking OR have quit w/in 15years.) does not qualify.   Lung Cancer Screening Referral: No  Additional Screening:  Hepatitis C Screening: does qualify; Completed 06/01/2020  Vision Screening: Recommended annual ophthalmology exams for early detection of glaucoma and other disorders of the eye. Is the patient up to date with their annual eye exam?  Yes  Who is the provider or what is the name of the office in which the patient attends annual eye exams? [facility] If pt is not established with a provider, would they like to be referred to a provider to establish care? No .  Dental Screening: Recommended annual dental exams for proper oral hygiene  Community Resource Referral / Chronic Care Management: CRR required this visit?  No   CCM required this visit?  No      Plan:     I have personally reviewed and noted the following in the patient's chart:   Medical and social history Use of alcohol, tobacco or illicit drugs  Current medications and supplements including opioid prescriptions. Patient is currently taking opioid prescriptions. Information provided to patient regarding non-opioid alternatives. Patient advised to discuss non-opioid treatment plan with their provider. Functional ability and status Nutritional status Physical activity Advanced directives List of other physicians Hospitalizations, surgeries, and ER visits in previous 12 months Vitals Screenings to include cognitive, depression, and falls Referrals and appointments  In addition, I  have reviewed and discussed with patient certain preventive protocols, quality metrics, and best practice recommendations. A written personalized care plan for preventive services as well as general preventive health recommendations were provided to patient.     Mickeal Needy, LPN   11/04/1094   Nurse Notes: Normal cognitive status assessed by direct observation by this Nurse Health Advisor. No abnormalities found.

## 2023-04-03 NOTE — Patient Instructions (Signed)
David Bolton , Thank you for taking time to come for your Medicare Wellness Visit. I appreciate your ongoing commitment to your health goals. Please review the following plan we discussed and let me know if I can assist you in the future.   These are the goals we discussed:  Goals      My goal  is to gain some weight at least 5 pounds.        This is a list of the screening recommended for you and due dates:  Health Maintenance  Topic Date Due   COVID-19 Vaccine (3 - 2023-24 season) 06/30/2022   Zoster (Shingles) Vaccine (1 of 2) 04/11/2023*   Flu Shot  05/31/2023   Hemoglobin A1C  07/12/2023   Eye exam for diabetics  12/29/2023   Yearly kidney health urinalysis for diabetes  01/09/2024   Complete foot exam   01/09/2024   Yearly kidney function blood test for diabetes  03/22/2024   Medicare Annual Wellness Visit  04/02/2024   DTaP/Tdap/Td vaccine (4 - Td or Tdap) 06/01/2030   Pneumonia Vaccine  Completed   Hepatitis C Screening  Completed   HPV Vaccine  Aged Out   Colon Cancer Screening  Discontinued  *Topic was postponed. The date shown is not the original due date.    Advanced directives: YES  Conditions/risks identified: YES  Next appointment: Follow up in one year for your annual wellness visit.   Preventive Care 80 Years and Older, Male  Preventive care refers to lifestyle choices and visits with your health care provider that can promote health and wellness. What does preventive care include? A yearly physical exam. This is also called an annual well check. Dental exams once or twice a year. Routine eye exams. Ask your health care provider how often you should have your eyes checked. Personal lifestyle choices, including: Daily care of your teeth and gums. Regular physical activity. Eating a healthy diet. Avoiding tobacco and drug use. Limiting alcohol use. Practicing safe sex. Taking low doses of aspirin every day. Taking vitamin and mineral supplements as  recommended by your health care provider. What happens during an annual well check? The services and screenings done by your health care provider during your annual well check will depend on your age, overall health, lifestyle risk factors, and family history of disease. Counseling  Your health care provider may ask you questions about your: Alcohol use. Tobacco use. Drug use. Emotional well-being. Home and relationship well-being. Sexual activity. Eating habits. History of falls. Memory and ability to understand (cognition). Work and work Astronomer. Screening  You may have the following tests or measurements: Height, weight, and BMI. Blood pressure. Lipid and cholesterol levels. These may be checked every 5 years, or more frequently if you are over 67 years old. Skin check. Lung cancer screening. You may have this screening every year starting at age 53 if you have a 30-pack-year history of smoking and currently smoke or have quit within the past 15 years. Fecal occult blood test (FOBT) of the stool. You may have this test every year starting at age 69. Flexible sigmoidoscopy or colonoscopy. You may have a sigmoidoscopy every 5 years or a colonoscopy every 10 years starting at age 16. Prostate cancer screening. Recommendations will vary depending on your family history and other risks. Hepatitis C blood test. Hepatitis B blood test. Sexually transmitted disease (STD) testing. Diabetes screening. This is done by checking your blood sugar (glucose) after you have not eaten for a while (  fasting). You may have this done every 1-3 years. Abdominal aortic aneurysm (AAA) screening. You may need this if you are a current or former smoker. Osteoporosis. You may be screened starting at age 46 if you are at high risk. Talk with your health care provider about your test results, treatment options, and if necessary, the need for more tests. Vaccines  Your health care provider may recommend  certain vaccines, such as: Influenza vaccine. This is recommended every year. Tetanus, diphtheria, and acellular pertussis (Tdap, Td) vaccine. You may need a Td booster every 10 years. Zoster vaccine. You may need this after age 63. Pneumococcal 13-valent conjugate (PCV13) vaccine. One dose is recommended after age 48. Pneumococcal polysaccharide (PPSV23) vaccine. One dose is recommended after age 41. Talk to your health care provider about which screenings and vaccines you need and how often you need them. This information is not intended to replace advice given to you by your health care provider. Make sure you discuss any questions you have with your health care provider. Document Released: 11/12/2015 Document Revised: 07/05/2016 Document Reviewed: 08/17/2015 Elsevier Interactive Patient Education  2017 [employer].  Fall Prevention in the Home Falls can cause injuries. They can happen to people of all ages. There are many things you can do to make your home safe and to help prevent falls. What can I do on the outside of my home? Regularly fix the edges of walkways and driveways and fix any cracks. Remove anything that might make you trip as you walk through a door, such as a raised step or threshold. Trim any bushes or trees on the path to your home. Use bright outdoor lighting. Clear any walking paths of anything that might make someone trip, such as rocks or tools. Regularly check to see if handrails are loose or broken. Make sure that both sides of any steps have handrails. Any raised decks and porches should have guardrails on the edges. Have any leaves, snow, or ice cleared regularly. Use sand or salt on walking paths during winter. Clean up any spills in your garage right away. This includes oil or grease spills. What can I do in the bathroom? Use night lights. Install grab bars by the toilet and in the tub and shower. Do not use towel bars as grab bars. Use non-skid mats or  decals in the tub or shower. If you need to sit down in the shower, use a plastic, non-slip stool. Keep the floor dry. Clean up any water that spills on the floor as soon as it happens. Remove soap buildup in the tub or shower regularly. Attach bath mats securely with double-sided non-slip rug tape. Do not have throw rugs and other things on the floor that can make you trip. What can I do in the bedroom? Use night lights. Make sure that you have a light by your bed that is easy to reach. Do not use any sheets or blankets that are too big for your bed. They should not hang down onto the floor. Have a firm chair that has side arms. You can use this for support while you get dressed. Do not have throw rugs and other things on the floor that can make you trip. What can I do in the kitchen? Clean up any spills right away. Avoid walking on wet floors. Keep items that you use a lot in easy-to-reach places. If you need to reach something above you, use a strong step stool that has a grab bar.  Keep electrical cords out of the way. Do not use floor polish or wax that makes floors slippery. If you must use wax, use non-skid floor wax. Do not have throw rugs and other things on the floor that can make you trip. What can I do with my stairs? Do not leave any items on the stairs. Make sure that there are handrails on both sides of the stairs and use them. Fix handrails that are broken or loose. Make sure that handrails are as long as the stairways. Check any carpeting to make sure that it is firmly attached to the stairs. Fix any carpet that is loose or worn. Avoid having throw rugs at the top or bottom of the stairs. If you do have throw rugs, attach them to the floor with carpet tape. Make sure that you have a light switch at the top of the stairs and the bottom of the stairs. If you do not have them, ask someone to add them for you. What else can I do to help prevent falls? Wear shoes that: Do not  have high heels. Have rubber bottoms. Are comfortable and fit you well. Are closed at the toe. Do not wear sandals. If you use a stepladder: Make sure that it is fully opened. Do not climb a closed stepladder. Make sure that both sides of the stepladder are locked into place. Ask someone to hold it for you, if possible. Clearly mark and make sure that you can see: Any grab bars or handrails. First and last steps. Where the edge of each step is. Use tools that help you move around (mobility aids) if they are needed. These include: Canes. Walkers. Scooters. Crutches. Turn on the lights when you go into a dark area. Replace any light bulbs as soon as they burn out. Set up your furniture so you have a clear path. Avoid moving your furniture around. If any of your floors are uneven, fix them. If there are any pets around you, be aware of where they are. Review your medicines with your doctor. Some medicines can make you feel dizzy. This can increase your chance of falling. Ask your doctor what other things that you can do to help prevent falls. This information is not intended to replace advice given to you by your health care provider. Make sure you discuss any questions you have with your health care provider. Document Released: 08/12/2009 Document Revised: 03/23/2016 Document Reviewed: 11/20/2014 Elsevier Interactive Patient Education  2017 [employer].

## 2023-04-23 ENCOUNTER — Other Ambulatory Visit: Payer: Self-pay | Admitting: Internal Medicine

## 2023-05-10 ENCOUNTER — Other Ambulatory Visit: Payer: Self-pay | Admitting: Internal Medicine

## 2023-06-11 ENCOUNTER — Other Ambulatory Visit: Payer: Self-pay | Admitting: Internal Medicine

## 2023-08-09 ENCOUNTER — Other Ambulatory Visit: Payer: Self-pay | Admitting: Internal Medicine

## 2023-09-25 ENCOUNTER — Other Ambulatory Visit: Payer: Self-pay | Admitting: Internal Medicine

## 2023-10-03 ENCOUNTER — Other Ambulatory Visit: Payer: Self-pay

## 2023-10-03 ENCOUNTER — Emergency Department
Admission: EM | Admit: 2023-10-03 | Discharge: 2023-10-03 | Disposition: A | Discharge status: Home / Self Care | Payer: Medicare Other | Attending: Emergency Medicine | Admitting: Emergency Medicine

## 2023-10-03 ENCOUNTER — Encounter: Payer: Self-pay | Admitting: Intensive Care

## 2023-10-03 DIAGNOSIS — R1030 Lower abdominal pain, unspecified: Secondary | ICD-10-CM | POA: Insufficient documentation

## 2023-10-03 DIAGNOSIS — R39198 Other difficulties with micturition: Secondary | ICD-10-CM | POA: Insufficient documentation

## 2023-10-03 DIAGNOSIS — R3915 Urgency of urination: Secondary | ICD-10-CM | POA: Diagnosis not present

## 2023-10-03 DIAGNOSIS — I10 Essential (primary) hypertension: Secondary | ICD-10-CM | POA: Diagnosis not present

## 2023-10-03 LAB — BASIC METABOLIC PANEL
Anion gap: 10 (ref 5–15)
BUN: 12 mg/dL (ref 8–23)
CO2: 22 mmol/L (ref 22–32)
Calcium: 9 mg/dL (ref 8.9–10.3)
Chloride: 103 mmol/L (ref 98–111)
Creatinine, Ser: 0.81 mg/dL (ref 0.61–1.24)
GFR, Estimated: >60 mL/min (ref >60)
Glucose, Bld: 141 mg/dL — ABNORMAL HIGH (ref 70–99)
Potassium: 3.9 mmol/L (ref 3.5–5.1)
Sodium: 135 mmol/L (ref 135–145)

## 2023-10-03 LAB — URINALYSIS, ROUTINE W REFLEX MICROSCOPIC
Bilirubin Urine: NEGATIVE
Glucose, UA: NEGATIVE mg/dL
Hgb urine dipstick: NEGATIVE
Ketones, ur: NEGATIVE mg/dL
Leukocytes,Ua: NEGATIVE
Nitrite: NEGATIVE
Protein, ur: NEGATIVE mg/dL
Specific Gravity, Urine: 1.013 (ref 1.005–1.030)
pH: 6 (ref 5.0–8.0)

## 2023-10-03 LAB — CBC
HCT: 44.3 % (ref 39.0–52.0)
Hemoglobin: 14.9 g/dL (ref 13.0–17.0)
MCH: 30.8 pg (ref 26.0–34.0)
MCHC: 33.6 g/dL (ref 30.0–36.0)
MCV: 91.5 fL (ref 80.0–100.0)
Platelets: 224 10*3/uL (ref 150–400)
RBC: 4.84 MIL/uL (ref 4.22–5.81)
RDW: 12.1 % (ref 11.5–15.5)
WBC: 7.5 10*3/uL (ref 4.0–10.5)
nRBC: 0 % (ref 0.0–0.2)

## 2023-10-03 MED ORDER — TAMSULOSIN HCL 0.4 MG PO CAPS: 0.4000 mg | ORAL_CAPSULE | Freq: Every day | ORAL | 0 refills | Status: DC

## 2023-10-03 NOTE — ED Triage Notes (Signed)
Patient c/o trouble urinating X2-3 weeks and worsened this AM. Reports every 20 minutes large urine output. Reports lower abdominal pain.

## 2023-10-03 NOTE — ED Provider Notes (Signed)
[facility] Provider Note    Event Date/Time   First MD Initiated Contact with Patient 10/03/23 1643     (approximate)   History   Chief Complaint Urinary Retention   HPI  David Bolton is a 82 y.o. male with past medical history of hypertension, hyperlipidemia, and anxiety who presents to the ED complaining of difficulty urinating.  Patient reports that he began having burning discomfort in his suprapubic area after waking up this morning.  Since then, he has felt like he needs to urinate every 20 minutes, but denies any dysuria when he does so.  He passes only a small amount of urine at a time and then will urgently need to use the bathroom shortly afterwards.  He feels like he is able to empty his bladder only about half the time that he urinates.  He reports history of bladder and prostate surgery multiple years ago, does not currently follow with a urologist.     Physical Exam   Triage Vital Signs: ED Triage Vitals  Encounter Vitals Group     BP 10/03/23 1507 (!) 177/98     Systolic BP Percentile --      Diastolic BP Percentile --      Pulse Rate 10/03/23 1507 65     Resp 10/03/23 1507 16     Temp 10/03/23 1507 98.9 F (37.2 C)     Temp Source 10/03/23 1507 Oral     SpO2 10/03/23 1507 99 %     Weight 10/03/23 1510 170 lb (77.1 kg)     Height 10/03/23 1510 5\' 8"  (1.727 m)     Head Circumference --      Peak Flow --      Pain Score 10/03/23 1509 5     Pain Loc --      Pain Education --      Exclude from Growth Chart --     Most recent vital signs: Vitals:   10/03/23 1507  BP: (!) 177/98  Pulse: 65  Resp: 16  Temp: 98.9 F (37.2 C)  SpO2: 99%    Constitutional: Alert and oriented. Eyes: Conjunctivae are normal. Head: Atraumatic. Nose: No congestion/rhinnorhea. Mouth/Throat: Mucous membranes are moist.  Cardiovascular: Normal rate, regular rhythm. Grossly normal heart sounds.  2+ radial pulses bilaterally. Respiratory: Normal  respiratory effort.  No retractions. Lungs CTAB. Gastrointestinal: Soft and tender to palpation in the suprapubic area with mild distention. Genitourinary: No testicular tenderness or edema. Musculoskeletal: No lower extremity tenderness nor edema.  Neurologic:  Normal speech and language. No gross focal neurologic deficits are appreciated.    ED Results / Procedures / Treatments   Labs (all labs ordered are listed, but only abnormal results are displayed) Labs Reviewed  URINALYSIS, ROUTINE W REFLEX MICROSCOPIC - Abnormal; Notable for the following components:      Result Value   Color, Urine YELLOW (*)    APPearance CLEAR (*)    All other components within normal limits  BASIC METABOLIC PANEL - Abnormal; Notable for the following components:   Glucose, Bld 141 (*)    All other components within normal limits  CBC    PROCEDURES:  Critical Care performed: No  Procedures   MEDICATIONS ORDERED IN ED: Medications - No data to display   IMPRESSION / MDM / ASSESSMENT AND PLAN / ED COURSE  I reviewed the triage vital signs and the nursing notes.  82 y.o. male with past medical history of hypertension, hyperlipidemia, and anxiety who presents to the ED with urinary frequency and urgency with suprapubic discomfort since waking up this morning.  Patient's presentation is most consistent with acute presentation with potential threat to life or bodily function.  Differential diagnosis includes, but is not limited to, urinary retention, AKI, UTI, kidney stone.  Patient nontoxic-appearing and in no acute distress, vital signs are unremarkable.  His abdomen is soft but he does have some mild tenderness in his suprapubic area along with mild distention.  He was just able to urinate and we will check bladder scan for postvoid residual.  Labs are reassuring with no significant anemia, leukocytosis, electrolyte abnormality, or AKI.  Urinalysis shows no signs of  infection and no blood to suggest kidney stone.  Bladder scan machine currently not working, bedside ultrasound performed by myself shows minimal urine in the bladder, no signs of significant urinary retention at this time.  Suspect enlarged prostate contributing to his urinary urgency and patient appropriate for outpatient management, will trial course of Flomax and have patient follow-up with urology.  He was counseled to return to the ED for new or worsening symptoms, patient agrees with plan.      FINAL CLINICAL IMPRESSION(S) / ED DIAGNOSES   Final diagnoses:  Urinary urgency     Rx / DC Orders   ED Discharge Orders          Ordered    tamsulosin (FLOMAX) 0.4 MG CAPS capsule  Daily after supper        10/03/23 1847             Note:  This document was prepared using Dragon voice recognition software and may include unintentional dictation errors.   Chesley Noon, MD 10/03/23 [phone]

## 2023-10-03 NOTE — ED Notes (Signed)
See triage notes. Patient c/o trouble urinating for the past two to three weeks with worsening this morning.

## 2023-10-08 ENCOUNTER — Other Ambulatory Visit: Payer: Self-pay

## 2023-10-08 ENCOUNTER — Other Ambulatory Visit: Payer: Self-pay | Admitting: Internal Medicine

## 2023-10-16 ENCOUNTER — Ambulatory Visit: Payer: Medicare Other | Admitting: Internal Medicine

## 2023-10-16 ENCOUNTER — Encounter: Payer: Self-pay | Admitting: Internal Medicine

## 2023-10-16 VITALS — BP 138/82 | HR 55 | Temp 97.9°F | Ht 68.0 in | Wt 162.0 lb

## 2023-10-16 DIAGNOSIS — Z7984 Long term (current) use of oral hypoglycemic drugs: Secondary | ICD-10-CM

## 2023-10-16 DIAGNOSIS — E7800 Pure hypercholesterolemia, unspecified: Secondary | ICD-10-CM | POA: Diagnosis not present

## 2023-10-16 DIAGNOSIS — R300 Dysuria: Secondary | ICD-10-CM

## 2023-10-16 DIAGNOSIS — J309 Allergic rhinitis, unspecified: Secondary | ICD-10-CM

## 2023-10-16 DIAGNOSIS — I350 Nonrheumatic aortic (valve) stenosis: Secondary | ICD-10-CM

## 2023-10-16 DIAGNOSIS — I10 Essential (primary) hypertension: Secondary | ICD-10-CM

## 2023-10-16 DIAGNOSIS — E1165 Type 2 diabetes mellitus with hyperglycemia: Secondary | ICD-10-CM

## 2023-10-16 DIAGNOSIS — R1031 Right lower quadrant pain: Secondary | ICD-10-CM

## 2023-10-16 LAB — URINALYSIS, ROUTINE W REFLEX MICROSCOPIC
Bilirubin Urine: NEGATIVE
Hgb urine dipstick: NEGATIVE
Ketones, ur: NEGATIVE
Leukocytes,Ua: NEGATIVE
Nitrite: NEGATIVE
RBC / HPF: NONE SEEN (ref 0–?)
Specific Gravity, Urine: 1.02 (ref 1.000–1.030)
Total Protein, Urine: NEGATIVE
Urine Glucose: NEGATIVE
Urobilinogen, UA: 0.2 (ref 0.0–1.0)
WBC, UA: NONE SEEN (ref 0–?)
pH: 6 (ref 5.0–8.0)

## 2023-10-16 LAB — CBC WITH DIFFERENTIAL/PLATELET
Basophils Absolute: 0 10*3/uL (ref 0.0–0.1)
Basophils Relative: 0.7 % (ref 0.0–3.0)
Eosinophils Absolute: 0.1 10*3/uL (ref 0.0–0.7)
Eosinophils Relative: 1.7 % (ref 0.0–5.0)
HCT: 44.7 % (ref 39.0–52.0)
Hemoglobin: 14.8 g/dL (ref 13.0–17.0)
Lymphocytes Relative: 20.5 % (ref 12.0–46.0)
Lymphs Abs: 1.3 10*3/uL (ref 0.7–4.0)
MCHC: 33.2 g/dL (ref 30.0–36.0)
MCV: 93.3 fL (ref 78.0–100.0)
Monocytes Absolute: 0.6 10*3/uL (ref 0.1–1.0)
Monocytes Relative: 8.9 % (ref 3.0–12.0)
Neutro Abs: 4.3 10*3/uL (ref 1.4–7.7)
Neutrophils Relative %: 68.2 % (ref 43.0–77.0)
Platelets: 249 10*3/uL (ref 150.0–400.0)
RBC: 4.79 Mil/uL (ref 4.22–5.81)
RDW: 13 % (ref 11.5–15.5)
WBC: 6.3 10*3/uL (ref 4.0–10.5)

## 2023-10-16 LAB — LIPID PANEL
Cholesterol: 125 mg/dL (ref 0–200)
HDL: 54.8 mg/dL (ref >39.00)
LDL Cholesterol: 50 mg/dL (ref 0–99)
NonHDL: 69.9
Total CHOL/HDL Ratio: 2
Triglycerides: 99 mg/dL (ref 0.0–149.0)
VLDL: 19.8 mg/dL (ref 0.0–40.0)

## 2023-10-16 LAB — HEPATIC FUNCTION PANEL
ALT: 12 U/L (ref 0–53)
AST: 21 U/L (ref 0–37)
Albumin: 4.2 g/dL (ref 3.5–5.2)
Alkaline Phosphatase: 64 U/L (ref 39–117)
Bilirubin, Direct: 0.2 mg/dL (ref 0.0–0.3)
Total Bilirubin: 0.8 mg/dL (ref 0.2–1.2)
Total Protein: 7 g/dL (ref 6.0–8.3)

## 2023-10-16 LAB — BASIC METABOLIC PANEL
BUN: 12 mg/dL (ref 6–23)
CO2: 27 meq/L (ref 19–32)
Calcium: 9.1 mg/dL (ref 8.4–10.5)
Chloride: 103 meq/L (ref 96–112)
Creatinine, Ser: 0.79 mg/dL (ref 0.40–1.50)
GFR: 84.18 mL/min (ref >60.00)
Glucose, Bld: 114 mg/dL — ABNORMAL HIGH (ref 70–99)
Potassium: 4.2 meq/L (ref 3.5–5.1)
Sodium: 137 meq/L (ref 135–145)

## 2023-10-16 LAB — HEMOGLOBIN A1C: Hgb A1c MFr Bld: 6.4 % (ref 4.6–6.5)

## 2023-10-16 NOTE — Patient Instructions (Signed)
Please continue all other medications as before, and refills have been done if requested.  Please have the pharmacy call with any other refills you may need.  Please continue your efforts at being more active, low cholesterol diet, and weight control.  Please keep your appointments with your specialists as you may have planned  Please let us know if you change your mind about having the Echocardiogram  You will be contacted regarding the referral for: CT scan , and Urology  Please go to the LAB at the blood drawing area for the tests to be done  You will be contacted by phone if any changes need to be made immediately.  Otherwise, you will receive a letter about your results with an explanation, but please check with MyChart first.  Please make an Appointment to return in 6 months, or sooner if needed

## 2023-10-16 NOTE — Progress Notes (Signed)
The test results show that your current treatment is OK, as the tests are stable.  Please continue the same plan.  There is no other need for change of treatment or further evaluation based on these results, at this time.  thanks 

## 2023-10-16 NOTE — Progress Notes (Unsigned)
Patient ID: David Bolton, male   DOB: 12-23-1940, 82 y.o.   MRN: [id number]        Chief Complaint: follow up HTN, HLD and DM, mod AS, rlq pain       HPI:  David Bolton is a 82 y.o. male here with c/o        RLQ pain mild intemittent recurringx months, wrose in the am, assoc with dysuria at times, and mild recent wt loss over last 6 mo.   Walking 5 miles per day.   Wt Readings from Last 3 Encounters:  10/16/23 162 lb (73.5 kg)  10/03/23 170 lb (77.1 kg)  04/03/23 161 lb 9.6 oz (73.3 kg)   BP Readings from Last 3 Encounters:  10/16/23 138/82  10/03/23 (!) 161/88  04/03/23 122/70         Past Medical History:  Diagnosis Date   ALLERGIC RHINITIS 06/08/2007   ANXIETY 06/08/2007   Cervicalgia 12/05/2007   COLONIC POLYPS, HX OF 06/08/2007   Cough 08/12/2010   DEGENERATION, CERVICAL DISC 07/30/2007   DEPENDENCE, ALCOHOL NEC/NOS, IN REMISSION 06/08/2007   DEPRESSION 06/08/2007   Diabetes mellitus without complication (HCC)    DIVERTICULOSIS, COLON 06/08/2007   EPIGASTRIC TENDERNESS 12/05/2007   FATIGUE 09/10/2008   GERD 06/08/2007   Headache(784.0) 06/08/2007   HYPERLIPIDEMIA 06/08/2007   HYPERTENSION 06/08/2007   IBS 06/08/2007   Impaired glucose tolerance 02/10/2011   LOW BACK PAIN, CHRONIC 08/12/2010   Nocturia 07/16/2009   PILONIDAL CYST 06/08/2007   PLANTAR FASCIITIS 06/08/2007   Past Surgical History:  Procedure Laterality Date   bladder and prostate surgury     CERVICAL FUSION     COLONOSCOPY  last colon 11/03/2008   w/Brodie=normal exam   pilonidal cyst surgury      reports that he has never smoked. He has never used smokeless tobacco. He reports current drug use. He reports that he does not drink alcohol. family history includes Heart attack in his paternal aunt; Heart disease in his mother; Kidney disease in an other family member. Allergies  Allergen Reactions   Sulfa Antibiotics Rash    Breaks out around private parts   Doxycycline Hyclate    Peanut-Containing Drug Products      Nose bleed, headache   Sulfonamide Derivatives    Current Outpatient Medications on File Prior to Visit  Medication Sig Dispense Refill   amLODipine (NORVASC) 5 MG tablet TAKE ONE TABLET BY MOUTH ONE TIME DAILY 90 tablet 2   aspirin EC 81 MG EC tablet Take 81 mg by mouth daily.     atorvastatin (LIPITOR) 20 MG tablet TAKE ONE TABLET BY MOUTH ONE TIME DAILY 90 tablet 3   Cholecalciferol (VITAMIN D3 PO) Take 125 mcg by mouth daily.     Ferrous Gluconate-C-Folic Acid (IRON-C PO) Take by mouth.     Glucosamine 500 MG TABS Take by mouth.     HYDROcodone-acetaminophen (NORCO) 10-325 MG tablet TAKE ONE-HALF TO ONE TABLET BY MOUTH AT BEDTIME AS NEEDED 30 tablet 0   lisinopril (ZESTRIL) 40 MG tablet TAKE ONE TABLET BY MOUTH ONE TIME DAILY 90 tablet 1   metFORMIN (GLUCOPHAGE-XR) 500 MG 24 hr tablet TAKE THREE TABLETS BY MOUTH IN THE MORNING WITH BREAKFAST 270 tablet 2   metoprolol succinate (TOPROL-XL) 50 MG 24 hr tablet TAKE ONE HALF TABLET BY MOUTH ONE TIME DAILY 90 tablet 1   Multiple Vitamin (MULTIVITAMIN) capsule Take 1 capsule by mouth daily.     Saw Palmetto 450 MG  CAPS Take 2 capsules by mouth daily.     tamsulosin (FLOMAX) 0.4 MG CAPS capsule Take 1 capsule (0.4 mg total) by mouth daily after supper. 30 capsule 0   No current facility-administered medications on file prior to visit.        ROS:  All others reviewed and negative.  Objective        PE:  BP 138/82 (BP Location: Right Arm, Patient Position: Sitting, Cuff Size: Normal)   Pulse (!) 55   Temp 97.9 F (36.6 C) (Oral)   Ht 5\' 8"  (1.727 m)   Wt 162 lb (73.5 kg)   SpO2 99%   BMI 24.63 kg/m                 Constitutional: Pt appears in NAD               HENT: Head: NCAT.                Right Ear: External ear normal.                 Left Ear: External ear normal.                Eyes: . Pupils are equal, round, and reactive to light. Conjunctivae and EOM are normal               Nose: without d/c or deformity                Neck: Neck supple. Gross normal ROM               Cardiovascular: Normal rate and regular rhythm.                 Pulmonary/Chest: Effort normal and breath sounds without rales or wheezing.                Abd:  Soft, NT, ND, + BS, no organomegaly               Neurological: Pt is alert. At baseline orientation, motor grossly intact               Skin: Skin is warm. No rashes, no other new lesions, LE edema - ***               Psychiatric: Pt behavior is normal without agitation   Micro: none  Cardiac tracings I have personally interpreted today:  none  Pertinent Radiological findings (summarize): none   Lab Results  Component Value Date   WBC 7.5 10/03/2023   HGB 14.9 10/03/2023   HCT 44.3 10/03/2023   PLT 224 10/03/2023   GLUCOSE 141 (H) 10/03/2023   CHOL 166 01/09/2023   TRIG 113.0 01/09/2023   HDL 57.70 01/09/2023   LDLDIRECT 139.0 02/10/2011   LDLCALC 85 01/09/2023   ALT 12 03/23/2023   AST 22 03/23/2023   NA 135 10/03/2023   K 3.9 10/03/2023   CL 103 10/03/2023   CREATININE 0.81 10/03/2023   BUN 12 10/03/2023   CO2 22 10/03/2023   TSH 1.431 03/23/2023   PSA 3.13 03/20/2022   HGBA1C 6.3 01/09/2023   MICROALBUR 3.2 (H) 01/09/2023   Assessment/Plan:  David Bolton is a 82 y.o. White or Caucasian [1] male with  has a past medical history of ALLERGIC RHINITIS (06/08/2007), ANXIETY (06/08/2007), Cervicalgia (12/05/2007), COLONIC POLYPS, HX OF (06/08/2007), Cough (08/12/2010), DEGENERATION, CERVICAL DISC (07/30/2007), DEPENDENCE, ALCOHOL NEC/NOS, IN REMISSION (06/08/2007), DEPRESSION (06/08/2007), Diabetes mellitus without  complication (HCC), DIVERTICULOSIS, COLON (06/08/2007), EPIGASTRIC TENDERNESS (12/05/2007), FATIGUE (09/10/2008), GERD (06/08/2007), Headache(784.0) (06/08/2007), HYPERLIPIDEMIA (06/08/2007), HYPERTENSION (06/08/2007), IBS (06/08/2007), Impaired glucose tolerance (02/10/2011), LOW BACK PAIN, CHRONIC (08/12/2010), Nocturia (07/16/2009), PILONIDAL CYST (06/08/2007), and PLANTAR FASCIITIS  (06/08/2007).  No problem-specific Assessment & Plan notes found for this encounter.  Followup: No follow-ups on file.  Oliver Barre, MD 10/16/2023 9:27 AM [facility]

## 2023-10-17 ENCOUNTER — Encounter: Payer: Self-pay | Admitting: Internal Medicine

## 2023-10-17 LAB — URINE CULTURE: Result:: NO GROWTH

## 2023-10-17 NOTE — Assessment & Plan Note (Signed)
D/w  pt - declines echo or cardiology f/u for now as he is doing well symptomatically

## 2023-10-17 NOTE — Assessment & Plan Note (Signed)
Persistent right sided pan, can't r/o renal stone -for CT renal stone

## 2023-10-17 NOTE — Assessment & Plan Note (Signed)
Lab Results  Component Value Date   LDLCALC 50 10/16/2023   Stable, pt to continue current statin lipitor 20 mg qd

## 2023-10-17 NOTE — Assessment & Plan Note (Signed)
Exam benign, for ua and refer urology

## 2023-10-17 NOTE — Assessment & Plan Note (Signed)
BP Readings from Last 3 Encounters:  10/16/23 138/82  10/03/23 (!) 161/88  04/03/23 122/70   Stable, pt to continue medical treatment norvasc 5 every day, lisinopril 40 every day, toprol  xl 25 qd

## 2023-10-17 NOTE — Assessment & Plan Note (Signed)
Lab Results  Component Value Date   HGBA1C 6.4 10/16/2023   Stable, pt to continue current medical treatment metformin ER 500 mg - 3 qd

## 2023-10-22 ENCOUNTER — Ambulatory Visit
Admission: RE | Admit: 2023-10-22 | Discharge: 2023-10-22 | Disposition: A | Discharge status: Home / Self Care | Payer: Medicare Other | Source: Ambulatory Visit | Attending: Internal Medicine | Admitting: Internal Medicine

## 2023-10-22 DIAGNOSIS — R1031 Right lower quadrant pain: Secondary | ICD-10-CM | POA: Diagnosis not present

## 2023-10-22 DIAGNOSIS — I700 Atherosclerosis of aorta: Secondary | ICD-10-CM | POA: Diagnosis not present

## 2023-10-22 DIAGNOSIS — K4090 Unilateral inguinal hernia, without obstruction or gangrene, not specified as recurrent: Secondary | ICD-10-CM | POA: Diagnosis not present

## 2023-10-22 DIAGNOSIS — K5730 Diverticulosis of large intestine without perforation or abscess without bleeding: Secondary | ICD-10-CM | POA: Diagnosis not present

## 2023-10-28 ENCOUNTER — Other Ambulatory Visit: Payer: Self-pay | Admitting: Internal Medicine

## 2023-10-29 ENCOUNTER — Other Ambulatory Visit: Payer: Self-pay

## 2023-11-03 ENCOUNTER — Other Ambulatory Visit: Payer: Self-pay | Admitting: Internal Medicine

## 2023-11-03 DIAGNOSIS — K4090 Unilateral inguinal hernia, without obstruction or gangrene, not specified as recurrent: Secondary | ICD-10-CM

## 2023-11-08 ENCOUNTER — Telehealth: Payer: Self-pay | Admitting: Internal Medicine

## 2023-11-08 DIAGNOSIS — K4090 Unilateral inguinal hernia, without obstruction or gangrene, not specified as recurrent: Secondary | ICD-10-CM

## 2023-11-08 NOTE — Telephone Encounter (Signed)
 Copied from CRM [id number]. Topic: Referral - Request for Referral >> Nov 08, 2023 10:08 AM Merlynn A wrote: Did the patient discuss referral with their provider in the last year? Yes (If No - schedule appointment) (If Yes - send message)  Appointment offered? Yes  Type of order/referral and detailed reason for visit: Hernia Specialist  Preference of office, provider, location: Patient unsure of provider, but prefers somewhere in [place].   If referral order, have you been seen by this specialty before? No (If Yes, this issue or another issue? When? Where?  Can we respond through MyChart? Yes

## 2023-11-08 NOTE — Telephone Encounter (Signed)
 Ok this is done

## 2023-11-12 ENCOUNTER — Other Ambulatory Visit: Payer: Self-pay | Admitting: Internal Medicine

## 2023-11-14 ENCOUNTER — Encounter: Payer: Self-pay | Admitting: Surgery

## 2023-11-14 ENCOUNTER — Ambulatory Visit (INDEPENDENT_AMBULATORY_CARE_PROVIDER_SITE_OTHER): Payer: Medicare Other | Admitting: Surgery

## 2023-11-14 VITALS — BP 150/69 | HR 66 | Temp 98.2°F | Ht 68.0 in | Wt 168.0 lb

## 2023-11-14 DIAGNOSIS — K429 Umbilical hernia without obstruction or gangrene: Secondary | ICD-10-CM

## 2023-11-14 DIAGNOSIS — K4090 Unilateral inguinal hernia, without obstruction or gangrene, not specified as recurrent: Secondary | ICD-10-CM

## 2023-11-14 NOTE — H&P (View-Only) (Signed)
 11/14/2023  Reason for Visit:  Right inguinal hernia  Requesting Provider:  Rosalia Colonel, MD  History of Present Illness: David Bolton is a 83 y.o. male presenting for evaluation of a right inguinal hernia.  The patient reports that he's noticed recently a bulging in the right groin and pain.  At first it started more as discomfort/pain in the right groin around 2 months ago, and last month started noticing bulging.  The discomfort is only in the right groin and he denies any radiation of the pain.  The bulging has remained stable within the groin.  No issues on the left side or the umbilicus.  Denies any constipation or diarrhea or urinary issues.  He has had surgery in the past for his bladder/stones, but denies any prostate procedures.  He had a CT scan on 10/22/23 and this showed a right inguinal hernia containing a loop of distal ileum without obstructive signs.  Of note, he also has a history of moderate aortic stenosis.  Last echo was in 2022 and was supposed to have yearly exams but has not seen cardiology in two years.  He remains very active and reports walking 5 miles per day.  Past Medical History: Past Medical History:  Diagnosis Date   ALLERGIC RHINITIS 06/08/2007   ANXIETY 06/08/2007   Cervicalgia 12/05/2007   COLONIC POLYPS, HX OF 06/08/2007   Cough 08/12/2010   DEGENERATION, CERVICAL DISC 07/30/2007   DEPENDENCE, ALCOHOL NEC/NOS, IN REMISSION 06/08/2007   DEPRESSION 06/08/2007   Diabetes mellitus without complication (HCC)    DIVERTICULOSIS, COLON 06/08/2007   EPIGASTRIC TENDERNESS 12/05/2007   FATIGUE 09/10/2008   GERD 06/08/2007   Headache(784.0) 06/08/2007   HYPERLIPIDEMIA 06/08/2007   HYPERTENSION 06/08/2007   IBS 06/08/2007   Impaired glucose tolerance 02/10/2011   LOW BACK PAIN, CHRONIC 08/12/2010   Nocturia 07/16/2009   PILONIDAL CYST 06/08/2007   PLANTAR FASCIITIS 06/08/2007     Past Surgical History: Past Surgical History:  Procedure Laterality Date   bladder and prostate  surgury     CERVICAL FUSION     COLONOSCOPY  last colon 11/03/2008   w/Brodie=normal exam   pilonidal cyst surgury      Home Medications: Prior to Admission medications   Medication Sig Start Date End Date Taking? Authorizing Provider  amLODipine  (NORVASC ) 5 MG tablet TAKE ONE TABLET BY MOUTH ONE TIME DAILY 11/09/21  Yes Roslyn Coombe, MD  atorvastatin  (LIPITOR) 20 MG tablet TAKE ONE TABLET BY MOUTH ONE TIME DAILY 10/08/23  Yes Roslyn Coombe, MD  Cholecalciferol (VITAMIN D3 PO) Take 125 mcg by mouth daily.   Yes [provider]  Ferrous Gluconate-C-Folic Acid (IRON-C PO) Take by mouth.   Yes [provider]  Glucosamine 500 MG TABS Take by mouth.   Yes [provider]  HYDROcodone -acetaminophen  (NORCO) 10-325 MG tablet TAKE HALF TO ONE TABLET BY MOUTH AT BEDTIME AS NEEDED 11/12/23  Yes Roslyn Coombe, MD  lisinopril  (ZESTRIL ) 40 MG tablet TAKE ONE TABLET BY MOUTH ONE TIME DAILY 05/10/23  Yes Roslyn Coombe, MD  metFORMIN  (GLUCOPHAGE -XR) 500 MG 24 hr tablet TAKE THREE TABLETS BY MOUTH IN THE MORNING WITH BREAKFAST 10/29/23  Yes Roslyn Coombe, MD  metoprolol  succinate (TOPROL -XL) 50 MG 24 hr tablet TAKE ONE HALF TABLET BY MOUTH ONE TIME DAILY 05/10/23  Yes Roslyn Coombe, MD  Multiple Vitamin (MULTIVITAMIN) capsule Take 1 capsule by mouth daily.   Yes [provider]  Saw Palmetto 450 MG CAPS Take  2 capsules by mouth daily.   Yes [provider]  tamsulosin  (FLOMAX ) 0.4 MG CAPS capsule Take 1 capsule (0.4 mg total) by mouth daily after supper. 10/03/23  Yes Twilla Galea, MD    Allergies: Allergies  Allergen Reactions   Sulfa Antibiotics Rash    Breaks out around private parts   Doxycycline Hyclate    Peanut-Containing Drug Products     Nose bleed, headache   Sulfonamide Derivatives     Social History:  reports that he has never smoked. He has never been exposed to tobacco smoke. He has never used smokeless tobacco. He reports current drug use. He  reports that he does not drink alcohol.   Family History: Family History  Problem Relation Age of Onset   Heart disease Mother        mature heart disease   Heart attack Paternal Aunt    Kidney disease Other    Colon cancer Neg Hx    Colon polyps Neg Hx    Rectal cancer Neg Hx    Stomach cancer Neg Hx     Review of Systems: Review of Systems  Constitutional:  Negative for chills and fever.  HENT:  Negative for hearing loss.   Respiratory:  Negative for shortness of breath.   Cardiovascular:  Negative for chest pain.  Gastrointestinal:  Positive for abdominal pain. Negative for constipation, diarrhea, nausea and vomiting.  Genitourinary:  Negative for dysuria.  Musculoskeletal:  Negative for myalgias.  Skin:  Negative for rash.  Neurological:  Negative for dizziness.  Psychiatric/Behavioral:  Negative for depression.     Physical Exam BP (!) 150/69   Pulse 66   Temp 98.2 F (36.8 C)   Ht 5\' 8"  (1.727 m)   Wt 168 lb (76.2 kg)   SpO2 98%   BMI 25.54 kg/m  CONSTITUTIONAL: No acute distress HEENT:  Normocephalic, atraumatic, extraocular motion intact. NECK: Trachea is midline, and there is no jugular venous distension.  RESPIRATORY:  Lungs are clear, and breath sounds are equal bilaterally. Normal respiratory effort without pathologic use of accessory muscles. CARDIOVASCULAR: Regular rhythm and rate.  Has systolic murmur consistent with his aortic stenosis. GI: The abdomen is soft, non-distended, non-tender.  The patient has a reducible right inguinal hernia without significant pain, and also a reducible umbilical hernia that is about 1 cm size.  No hernia on the left groin. MUSCULOSKELETAL:  Normal muscle strength and tone in all four extremities.  No peripheral edema or cyanosis. SKIN: Skin turgor is normal. There are no pathologic skin lesions.  NEUROLOGIC:  Motor and sensation is grossly normal.  Cranial nerves are grossly intact. PSYCH:  Alert and oriented to person,  place and time. Affect is normal.  Laboratory Analysis: Labs from 10/16/2023: Sodium 137, potassium 4.2, chloride 103, CO2 27, BUN 12, creatinine 0.79.  LFTs within normal limits.  WBC 6.3, hemoglobin 14.8, hematocrit 44.7, platelets 249.  Imaging: CT renal stone study on 10/22/2023: IMPRESSION: 1. Right inguinal hernia containing a segment of distal ileum. No evidence to suggest incarceration or strangulation. 2. No acute findings in the abdomen or pelvis. No evidence of urinary tract calculi or hydronephrosis. 3. Diffuse diverticulosis of the entire colon without diverticulitis. 4. Prostatic enlargement with estimated prostate volume of 83 mL. 5. Aortic atherosclerosis without aneurysm. 6. Evidence of prior granulomatous disease with calcified granulomata at the left lung base and in the spleen.  Assessment and Plan: This is a 83 y.o. male with a right inguinal hernia.  -  Discussed with patient the findings on his exam and CT scan today.  He does have a reducible right inguinal hernia and a CT scan that contains a loop of small bowel.  He also have a umbilical hernia that is small.  There is no evidence of incarceration or strangulation on either 1.  Discussed with him that we can offer surgical repair for this in order to prevent further complications.  He is in agreement. - Discussed with him the plan for robotic assisted right inguinal hernia repair with an open umbilical hernia repair and reviewed the surgery at length with him including the planned incisions, risks of bleeding, infection, injury to surrounding structures, that this would be an outpatient procedure, postoperative activity restrictions, pain control, and he is willing to proceed. - Given the patient's moderate aortic stenosis, I think would be prudent to obtain cardiology clearance.  He most likely will need another echocardiogram as he has not had 1 in more than 2 years. - Tentatively, we will schedule him for surgery  for 12/04/2023 but the patient is aware that this may need to be postponed pending cardiology evaluation and clearance. - All of his questions have been answered.  I spent 55 minutes dedicated to the care of this patient on the date of this encounter to include pre-visit review of records, face-to-face time with the patient discussing diagnosis and management, and any post-visit coordination of care.   Marene Shape, MD  [facility]

## 2023-11-14 NOTE — Progress Notes (Signed)
 11/14/2023  Reason for Visit:  Right inguinal hernia  Requesting Provider:  Rosalia Colonel, MD  History of Present Illness: David Bolton is a 83 y.o. male presenting for evaluation of a right inguinal hernia.  The patient reports that he's noticed recently a bulging in the right groin and pain.  At first it started more as discomfort/pain in the right groin around 2 months ago, and last month started noticing bulging.  The discomfort is only in the right groin and he denies any radiation of the pain.  The bulging has remained stable within the groin.  No issues on the left side or the umbilicus.  Denies any constipation or diarrhea or urinary issues.  He has had surgery in the past for his bladder/stones, but denies any prostate procedures.  He had a CT scan on 10/22/23 and this showed a right inguinal hernia containing a loop of distal ileum without obstructive signs.  Of note, he also has a history of moderate aortic stenosis.  Last echo was in 2022 and was supposed to have yearly exams but has not seen cardiology in two years.  He remains very active and reports walking 5 miles per day.  Past Medical History: Past Medical History:  Diagnosis Date   ALLERGIC RHINITIS 06/08/2007   ANXIETY 06/08/2007   Cervicalgia 12/05/2007   COLONIC POLYPS, HX OF 06/08/2007   Cough 08/12/2010   DEGENERATION, CERVICAL DISC 07/30/2007   DEPENDENCE, ALCOHOL NEC/NOS, IN REMISSION 06/08/2007   DEPRESSION 06/08/2007   Diabetes mellitus without complication (HCC)    DIVERTICULOSIS, COLON 06/08/2007   EPIGASTRIC TENDERNESS 12/05/2007   FATIGUE 09/10/2008   GERD 06/08/2007   Headache(784.0) 06/08/2007   HYPERLIPIDEMIA 06/08/2007   HYPERTENSION 06/08/2007   IBS 06/08/2007   Impaired glucose tolerance 02/10/2011   LOW BACK PAIN, CHRONIC 08/12/2010   Nocturia 07/16/2009   PILONIDAL CYST 06/08/2007   PLANTAR FASCIITIS 06/08/2007     Past Surgical History: Past Surgical History:  Procedure Laterality Date   bladder and prostate  surgury     CERVICAL FUSION     COLONOSCOPY  last colon 11/03/2008   w/Brodie=normal exam   pilonidal cyst surgury      Home Medications: Prior to Admission medications   Medication Sig Start Date End Date Taking? Authorizing Provider  amLODipine  (NORVASC ) 5 MG tablet TAKE ONE TABLET BY MOUTH ONE TIME DAILY 11/09/21  Yes Roslyn Coombe, MD  atorvastatin  (LIPITOR) 20 MG tablet TAKE ONE TABLET BY MOUTH ONE TIME DAILY 10/08/23  Yes Roslyn Coombe, MD  Cholecalciferol (VITAMIN D3 PO) Take 125 mcg by mouth daily.   Yes [provider]  Ferrous Gluconate-C-Folic Acid (IRON-C PO) Take by mouth.   Yes [provider]  Glucosamine 500 MG TABS Take by mouth.   Yes [provider]  HYDROcodone -acetaminophen  (NORCO) 10-325 MG tablet TAKE HALF TO ONE TABLET BY MOUTH AT BEDTIME AS NEEDED 11/12/23  Yes Roslyn Coombe, MD  lisinopril  (ZESTRIL ) 40 MG tablet TAKE ONE TABLET BY MOUTH ONE TIME DAILY 05/10/23  Yes Roslyn Coombe, MD  metFORMIN  (GLUCOPHAGE -XR) 500 MG 24 hr tablet TAKE THREE TABLETS BY MOUTH IN THE MORNING WITH BREAKFAST 10/29/23  Yes Roslyn Coombe, MD  metoprolol  succinate (TOPROL -XL) 50 MG 24 hr tablet TAKE ONE HALF TABLET BY MOUTH ONE TIME DAILY 05/10/23  Yes Roslyn Coombe, MD  Multiple Vitamin (MULTIVITAMIN) capsule Take 1 capsule by mouth daily.   Yes [provider]  Saw Palmetto 450 MG CAPS Take  2 capsules by mouth daily.   Yes [provider]  tamsulosin  (FLOMAX ) 0.4 MG CAPS capsule Take 1 capsule (0.4 mg total) by mouth daily after supper. 10/03/23  Yes Twilla Galea, MD    Allergies: Allergies  Allergen Reactions   Sulfa Antibiotics Rash    Breaks out around private parts   Doxycycline Hyclate    Peanut-Containing Drug Products     Nose bleed, headache   Sulfonamide Derivatives     Social History:  reports that he has never smoked. He has never been exposed to tobacco smoke. He has never used smokeless tobacco. He reports current drug use. He  reports that he does not drink alcohol.   Family History: Family History  Problem Relation Age of Onset   Heart disease Mother        mature heart disease   Heart attack Paternal Aunt    Kidney disease Other    Colon cancer Neg Hx    Colon polyps Neg Hx    Rectal cancer Neg Hx    Stomach cancer Neg Hx     Review of Systems: Review of Systems  Constitutional:  Negative for chills and fever.  HENT:  Negative for hearing loss.   Respiratory:  Negative for shortness of breath.   Cardiovascular:  Negative for chest pain.  Gastrointestinal:  Positive for abdominal pain. Negative for constipation, diarrhea, nausea and vomiting.  Genitourinary:  Negative for dysuria.  Musculoskeletal:  Negative for myalgias.  Skin:  Negative for rash.  Neurological:  Negative for dizziness.  Psychiatric/Behavioral:  Negative for depression.     Physical Exam BP (!) 150/69   Pulse 66   Temp 98.2 F (36.8 C)   Ht 5\' 8"  (1.727 m)   Wt 168 lb (76.2 kg)   SpO2 98%   BMI 25.54 kg/m  CONSTITUTIONAL: No acute distress HEENT:  Normocephalic, atraumatic, extraocular motion intact. NECK: Trachea is midline, and there is no jugular venous distension.  RESPIRATORY:  Lungs are clear, and breath sounds are equal bilaterally. Normal respiratory effort without pathologic use of accessory muscles. CARDIOVASCULAR: Regular rhythm and rate.  Has systolic murmur consistent with his aortic stenosis. GI: The abdomen is soft, non-distended, non-tender.  The patient has a reducible right inguinal hernia without significant pain, and also a reducible umbilical hernia that is about 1 cm size.  No hernia on the left groin. MUSCULOSKELETAL:  Normal muscle strength and tone in all four extremities.  No peripheral edema or cyanosis. SKIN: Skin turgor is normal. There are no pathologic skin lesions.  NEUROLOGIC:  Motor and sensation is grossly normal.  Cranial nerves are grossly intact. PSYCH:  Alert and oriented to person,  place and time. Affect is normal.  Laboratory Analysis: Labs from 10/16/2023: Sodium 137, potassium 4.2, chloride 103, CO2 27, BUN 12, creatinine 0.79.  LFTs within normal limits.  WBC 6.3, hemoglobin 14.8, hematocrit 44.7, platelets 249.  Imaging: CT renal stone study on 10/22/2023: IMPRESSION: 1. Right inguinal hernia containing a segment of distal ileum. No evidence to suggest incarceration or strangulation. 2. No acute findings in the abdomen or pelvis. No evidence of urinary tract calculi or hydronephrosis. 3. Diffuse diverticulosis of the entire colon without diverticulitis. 4. Prostatic enlargement with estimated prostate volume of 83 mL. 5. Aortic atherosclerosis without aneurysm. 6. Evidence of prior granulomatous disease with calcified granulomata at the left lung base and in the spleen.  Assessment and Plan: This is a 83 y.o. male with a right inguinal hernia.  -  Discussed with patient the findings on his exam and CT scan today.  He does have a reducible right inguinal hernia and a CT scan that contains a loop of small bowel.  He also have a umbilical hernia that is small.  There is no evidence of incarceration or strangulation on either 1.  Discussed with him that we can offer surgical repair for this in order to prevent further complications.  He is in agreement. - Discussed with him the plan for robotic assisted right inguinal hernia repair with an open umbilical hernia repair and reviewed the surgery at length with him including the planned incisions, risks of bleeding, infection, injury to surrounding structures, that this would be an outpatient procedure, postoperative activity restrictions, pain control, and he is willing to proceed. - Given the patient's moderate aortic stenosis, I think would be prudent to obtain cardiology clearance.  He most likely will need another echocardiogram as he has not had 1 in more than 2 years. - Tentatively, we will schedule him for surgery  for 12/04/2023 but the patient is aware that this may need to be postponed pending cardiology evaluation and clearance. - All of his questions have been answered.  I spent 55 minutes dedicated to the care of this patient on the date of this encounter to include pre-visit review of records, face-to-face time with the patient discussing diagnosis and management, and any post-visit coordination of care.   Marene Shape, MD  [facility]

## 2023-11-14 NOTE — Progress Notes (Signed)
 Request for Cardiology Clearance has been faxed to Dr Peter [place] at [facility].

## 2023-11-14 NOTE — Patient Instructions (Addendum)
 We will request a Cardiology clearance, they will need to see you before they clear you for surgery.  You will need to hold any Aspirin 5 days before surgery.   You have chose to have your hernia repaired. This will be done by Dr. Mauri Sous at [facility].  Please see your (blue) Pre-care information that you have been given today. Our surgery scheduler will call you to verify surgery date and to go over information.   You will need to arrange to be out of work for approximately 1-2 weeks and then you may return with a lifting restriction for 4 more weeks. If you have FMLA or Disability paperwork that needs to be filled out, please have your company fax your paperwork to [phone] or you may drop this by either office. This paperwork will be filled out within 3 days after your surgery has been completed.  You may have a bruise in your groin and also swelling and brusing in your testicle area. You may use ice 4-5 times daily for 15-20 minutes each time. Make sure that you place a barrier between you and the ice pack. To decrease the swelling, you may roll up a bath towel and place it vertically in between your thighs with your testicles resting on the towel. You will want to keep this area elevated as much as possible for several days following surgery.    Inguinal Hernia, Adult Muscles help keep everything in the body in its proper place. But if a weak spot in the muscles develops, something can poke through. That is called a hernia. When this happens in the lower part of the belly (abdomen), it is called an inguinal hernia. (It takes its name from a part of the body in this region called the inguinal canal.) A weak spot in the wall of muscles lets some fat or part of the small intestine bulge through. An inguinal hernia can develop at any age. Men get them more often than women. CAUSES  In adults, an inguinal hernia develops over time. It can be triggered by: Suddenly straining the muscles  of the lower abdomen. Lifting heavy objects. Straining to have a bowel movement. Difficult bowel movements (constipation) can lead to this. Constant coughing. This may be caused by smoking or lung disease. Being overweight. Being pregnant. Working at a job that requires long periods of standing or heavy lifting. Having had an inguinal hernia before. One type can be an emergency situation. It is called a strangulated inguinal hernia. It develops if part of the small intestine slips through the weak spot and cannot get back into the abdomen. The blood supply can be cut off. If that happens, part of the intestine may die. This situation requires emergency surgery. SYMPTOMS  Often, a small inguinal hernia has no symptoms. It is found when a healthcare provider does a physical exam. Larger hernias usually have symptoms.  In adults, symptoms may include: A lump in the groin. This is easier to see when the person is standing. It might disappear when lying down. In men, a lump in the scrotum. Pain or burning in the groin. This occurs especially when lifting, straining or coughing. A dull ache or feeling of pressure in the groin. Signs of a strangulated hernia can include: A bulge in the groin that becomes very painful and tender to the touch. A bulge that turns red or purple. Fever, nausea and vomiting. Inability to have a bowel movement or to pass gas. DIAGNOSIS  To decide if you have an inguinal hernia, a healthcare provider will probably do a physical examination. This will include asking questions about any symptoms you have noticed. The healthcare provider might feel the groin area and ask you to cough. If an inguinal hernia is felt, the healthcare provider may try to slide it back into the abdomen. Usually no other tests are needed. TREATMENT  Treatments can vary. The size of the hernia makes a difference. Options include: Watchful waiting. This is often suggested if the hernia is small and  you have had no symptoms. No medical procedure will be done unless symptoms develop. You will need to watch closely for symptoms. If any occur, contact your healthcare provider right away. Surgery. This is used if the hernia is larger or you have symptoms. Open surgery. This is usually an outpatient procedure (you will not stay overnight in a hospital). An cut (incision) is made through the skin in the groin. The hernia is put back inside the abdomen. The weak area in the muscles is then repaired by herniorrhaphy or hernioplasty. Herniorrhaphy: in this type of surgery, the weak muscles are sewn back together. Hernioplasty: a patch or mesh is used to close the weak area in the abdominal wall. Laparoscopy. In this procedure, a surgeon makes small incisions. A thin tube with a tiny video camera (called a laparoscope) is put into the abdomen. The surgeon repairs the hernia with mesh by looking with the video camera and using two long instruments. HOME CARE INSTRUCTIONS  After surgery to repair an inguinal hernia: You will need to take pain medicine prescribed by your healthcare provider. Follow all directions carefully. You will need to take care of the wound from the incision. Your activity will be restricted for awhile. This will probably include no heavy lifting for several weeks. You also should not do anything too active for a few weeks. When you can return to work will depend on the type of job that you have. During "watchful waiting" periods, you should: Maintain a healthy weight. Eat a diet high in fiber (fruits, vegetables and whole grains). Drink plenty of fluids to avoid constipation. This means drinking enough water and other liquids to keep your urine clear or pale yellow. Do not lift heavy objects. Do not stand for long periods of time. Quit smoking. This should keep you from developing a frequent cough. SEEK MEDICAL CARE IF:  A bulge develops in your groin area. You feel pain, a  burning sensation or pressure in the groin. This might be worse if you are lifting or straining. You develop a fever of more than 100.5 F (38.1 C). SEEK IMMEDIATE MEDICAL CARE IF:  Pain in the groin increases suddenly. A bulge in the groin gets bigger suddenly and does not go down. For men, there is sudden pain in the scrotum. Or, the size of the scrotum increases. A bulge in the groin area becomes red or purple and is painful to touch. You have nausea or vomiting that does not go away. You feel your heart beating much faster than normal. You cannot have a bowel movement or pass gas. You develop a fever of more than 102.0 F (38.9 C).   This information is not intended to replace advice given to you by your health care provider. Make sure you discuss any questions you have with your health care provider.   Document Released: 03/04/2009 Document Revised: 01/08/2012 Document Reviewed: 04/19/2015 Elsevier Interactive Patient Education [employer]

## 2023-11-15 ENCOUNTER — Telehealth: Payer: Self-pay | Admitting: Surgery

## 2023-11-15 NOTE — Telephone Encounter (Signed)
Left message for patient to call, please inform him of the following regarding scheduled surgery with Dr. Aleen Campi.   Pre-Admission date/time, and Surgery date at [facility].  Surgery Date: 12/04/23 Preadmission Testing Date: 11/26/23 (phone 1p-4p)  Also patient will need to call at [phone], between 1-3:00pm the day before surgery, to find out what time to arrive for surgery.

## 2023-11-15 NOTE — Telephone Encounter (Signed)
Patient called back and surgery information has been reviewed with him.

## 2023-11-21 ENCOUNTER — Other Ambulatory Visit: Payer: Self-pay

## 2023-11-21 ENCOUNTER — Other Ambulatory Visit: Payer: Self-pay | Admitting: Internal Medicine

## 2023-11-22 ENCOUNTER — Encounter: Payer: Self-pay | Admitting: Urgent Care

## 2023-11-26 ENCOUNTER — Encounter: Payer: Self-pay | Admitting: Urgent Care

## 2023-11-26 ENCOUNTER — Encounter
Admission: RE | Admit: 2023-11-26 | Discharge: 2023-11-26 | Disposition: A | Discharge status: Home / Self Care | Payer: Medicare Other | Source: Ambulatory Visit | Attending: Surgery | Admitting: Surgery

## 2023-11-26 ENCOUNTER — Other Ambulatory Visit: Payer: Self-pay | Admitting: Internal Medicine

## 2023-11-26 ENCOUNTER — Other Ambulatory Visit: Payer: Self-pay | Admitting: Urgent Care

## 2023-11-26 ENCOUNTER — Telehealth: Payer: Self-pay | Admitting: *Deleted

## 2023-11-26 ENCOUNTER — Other Ambulatory Visit: Payer: Self-pay

## 2023-11-26 DIAGNOSIS — I350 Nonrheumatic aortic (valve) stenosis: Secondary | ICD-10-CM

## 2023-11-26 DIAGNOSIS — Z01810 Encounter for preprocedural cardiovascular examination: Secondary | ICD-10-CM

## 2023-11-26 DIAGNOSIS — Z01818 Encounter for other preprocedural examination: Secondary | ICD-10-CM

## 2023-11-26 DIAGNOSIS — I10 Essential (primary) hypertension: Secondary | ICD-10-CM

## 2023-11-26 HISTORY — DX: Unilateral inguinal hernia, without obstruction or gangrene, not specified as recurrent: K40.90

## 2023-11-26 HISTORY — DX: Type 2 diabetes mellitus without complications: E11.9

## 2023-11-26 HISTORY — DX: Umbilical hernia without obstruction or gangrene: K42.9

## 2023-11-26 HISTORY — DX: Other chronic pain: G89.29

## 2023-11-26 HISTORY — DX: Atherosclerosis of aorta: I70.0

## 2023-11-26 HISTORY — DX: Elevated prostate specific antigen (PSA): R97.20

## 2023-11-26 HISTORY — DX: Unspecified hemorrhoids: K64.9

## 2023-11-26 NOTE — Telephone Encounter (Signed)
-----   Message from Verlee Monte sent at 11/26/2023  3:01 PM EST ----- Regarding: Request for pre-operative cardiac clearance Request for pre-operative cardiac clearance:  1. What type of surgery is being performed?  XI ROBOTIC ASSISTED INGUINAL HERNIA (RIGHT); UMBILICAL HERNIA REPAIR.    2. When is this surgery scheduled?  12/04/2023.  Important to note: patient lost to follow up with Dr. Swaziland. Will be a new patient at this point. I have entered an order for repeat TTE. Please try to have this testing completed prior to him being seen by provider so that MD can follow up on his known aortic stenosis.   3. Type of clearance being requested (medical, pharmacy, both)? MEDICAL   4. Are there any medications that need to be held prior to surgery? NONE  5. Practice name and name of physician performing surgery?  Performing surgeon: Dr. Henrene Dodge, MD Requesting clearance: Quentin Mulling, FNP-C    6. Anesthesia type (none, local, MAC, general)? GENERAL  7. What is the office phone and fax number?   Phone: [phone] Fax: [phone]  ATTENTION: Unable to create telephone message as per your standard workflow. Directed by [facility] providers to send requests for cardiac clearance to this pool for appropriate distribution to provider covering pre-operative clearances.   Quentin Mulling, MSN, APRN, FNP-C, CEN [facility]  Peri-operative Services Nurse Practitioner Phone: [phone] 11/26/23 3:01 PM

## 2023-11-26 NOTE — Progress Notes (Signed)
Hi Bryan,   I am not sure where the clearance request is, but if you will resend to [phone] I will be sure to get in the system and see if our scheduling team can help get a new pt appt. I am not sure how far out we are booked for new pt appt, but I will try.   Thank you Okey Regal

## 2023-11-26 NOTE — Patient Instructions (Addendum)
Your procedure is scheduled on:12-04-23 Tuesday Report to the Registration Desk on the 1st floor of the Medical Mall.Then proceed to the 2nd floor Surgery Desk To find out your arrival time, please call [phone] between 1PM - 3PM on:12-03-23 Monday If your arrival time is 6:00 am, do not arrive before that time as the Medical Mall entrance doors do not open until 6:00 am.  REMEMBER: Instructions that are not followed completely may result in serious medical risk, up to and including death; or upon the discretion of your surgeon and anesthesiologist your surgery may need to be rescheduled.  Do not eat food after midnight the night before surgery.  No gum chewing or hard candies.  You may however, drink Water up to 2 hours before you are scheduled to arrive for your surgery. Do not drink anything within 2 hours of your scheduled arrival time.  One week prior to surgery:Stop NOW (11-26-23) Stop Anti-inflammatories (NSAIDS) such as Advil, Aleve, Ibuprofen, Motrin, Naproxen, Naprosyn and Aspirin based products such as Excedrin, Goody's Powder, BC Powder. Stop ANY OVER THE COUNTER supplements until after surgery (Vitamin D3, Gentle Iron, Glucosamine-Chondroitin, Multivitamin, Saw Palmetto)  You may however, continue to take Tylenol/ Hydrocodone if needed for pain up until the day of surgery.  Stop metFORMIN (GLUCOPHAGE-XR) 2 days prior to surgery-Last dose will be on 12-01-23 Saturday  Continue taking all of your other prescription medications up until the day of surgery.  ON THE DAY OF SURGERY ONLY TAKE THESE MEDICATIONS WITH SIPS OF WATER: -amLODipine (NORVASC)  -metoprolol succinate (TOPROL-XL)   No Alcohol for 24 hours before or after surgery.  No Smoking including e-cigarettes for 24 hours before surgery.  No chewable tobacco products for at least 6 hours before surgery.  No nicotine patches on the day of surgery.  Do not use any "recreational" drugs for at least a week (preferably 2  weeks) before your surgery.  Please be advised that the combination of cocaine and anesthesia may have negative outcomes, up to and including death. If you test positive for cocaine, your surgery will be cancelled.  On the morning of surgery brush your teeth with toothpaste and water, you may rinse your mouth with mouthwash if you wish. Do not swallow any toothpaste or mouthwash.  Use CHG Soap as directed on instruction sheet.  Do not wear jewelry, make-up, hairpins, clips or nail polish.  For welded (permanent) jewelry: bracelets, anklets, waist bands, etc.  Please have this removed prior to surgery.  If it is not removed, there is a chance that hospital personnel will need to cut it off on the day of surgery.  Do not wear lotions, powders, or perfumes.   Do not shave body hair from the neck down 48 hours before surgery.  Contact lenses, hearing aids and dentures may not be worn into surgery.  Do not bring valuables to the hospital. [facility] is not responsible for any missing/lost belongings or valuables.  Notify your doctor if there is any change in your medical condition (cold, fever, infection).  Wear comfortable clothing (specific to your surgery type) to the hospital.  After surgery, you can help prevent lung complications by doing breathing exercises.  Take deep breaths and cough every 1-2 hours. Your doctor may order a device called an Incentive Spirometer to help you take deep breaths. When coughing or sneezing, hold a pillow firmly against your incision with both hands. This is called "splinting." Doing this helps protect your incision. It also decreases belly  discomfort.  If you are being admitted to the hospital overnight, leave your suitcase in the car. After surgery it may be brought to your room.  In case of increased patient census, it may be necessary for you, the patient, to continue your postoperative care in the Same Day Surgery department.  If you are being  discharged the day of surgery, you will not be allowed to drive home. You will need a responsible individual to drive you home and stay with you for 24 hours after surgery.   If you are taking public transportation, you will need to have a responsible individual with you.  Please call the Pre-admissions Testing Dept. at [phone] if you have any questions about these instructions.  Surgery Visitation Policy:  Patients having surgery or a procedure may have two visitors.  Children under the age of 31 must have an adult with them who is not the patient.  Temporary Visitor Restrictions Due to increasing cases of flu, RSV and COVID-19: Children ages 63 and under will not be able to visit patients in [facility] hospitals under most circumstances.     Preparing for Surgery with CHLORHEXIDINE GLUCONATE (CHG) Soap  Chlorhexidine Gluconate (CHG) Soap  o An antiseptic cleaner that kills germs and bonds with the skin to continue killing germs even after washing  o Used for showering the night before surgery and morning of surgery  Before surgery, you can play an important role by reducing the number of germs on your skin.  CHG (Chlorhexidine gluconate) soap is an antiseptic cleanser which kills germs and bonds with the skin to continue killing germs even after washing.  Please do not use if you have an allergy to CHG or antibacterial soaps. If your skin becomes reddened/irritated stop using the CHG.  1. Shower the NIGHT BEFORE SURGERY and the MORNING OF SURGERY with CHG soap.  2. If you choose to wash your hair, wash your hair first as usual with your normal shampoo.  3. After shampooing, rinse your hair and body thoroughly to remove the shampoo.  4. Use CHG as you would any other liquid soap. You can apply CHG directly to the skin and wash gently with a scrungie or a clean washcloth.  5. Apply the CHG soap to your body only from the neck down. Do not use on open wounds or open  sores. Avoid contact with your eyes, ears, mouth, and genitals (private parts). Wash face and genitals (private parts) with your normal soap.  6. Wash thoroughly, paying special attention to the area where your surgery will be performed.  7. Thoroughly rinse your body with warm water.  8. Do not shower/wash with your normal soap after using and rinsing off the CHG soap.  9. Pat yourself dry with a clean towel.  10. Wear clean pajamas to bed the night before surgery.  12. Place clean sheets on your bed the night of your first shower and do not sleep with pets.  13. Shower again with the CHG soap on the day of surgery prior to arriving at the hospital.  14. Do not apply any deodorants/lotions/powders.  15. Please wear clean clothes to the hospital.

## 2023-11-26 NOTE — Telephone Encounter (Signed)
   Name: David Bolton  DOB: May 26, 1941  MRN: [id number]  Primary Cardiologist: None  Pt last seen by Dr. Swaziland in 2022.  Chart reviewed as part of pre-operative protocol coverage. Because of Kendricks G Mcmenamy's past medical history and time since last visit, he will require a follow-up in-office visit in order to better assess preoperative cardiovascular risk.  Pre-op covering staff: - Please schedule appointment and call patient to inform them. If patient already had an upcoming appointment within acceptable timeframe, please add "pre-op clearance" to the appointment notes so provider is aware. - Please contact requesting surgeon's office via preferred method (i.e, phone, fax) to inform them of need for appointment prior to surgery.  -Also, pt will need an echocardiogram to follow up on aortic stenosis. This should be scheduled now so that the provider that sees the pt has it to help make decision regarding clearance.   Tereso Newcomer, PA-C  11/26/2023, 5:43 PM

## 2023-11-26 NOTE — Progress Notes (Addendum)
  Perioperative Services Pre-Admission/Anesthesia Testing    Date: 11/26/23  Name: [name] MRN:   [id number]  Re: Need for cardiology evaluation and clearance   Planned Surgical Procedure(s):    Clinical Notes:  Patient is scheduled for the above procedure on 12/04/2023 with Dr. Henrene Dodge, MD.  During his PAT interview we discussed his PM, which notably includes aortic stenosis. Patient has been followed by cardiology (Swaziland, MD) in the past, however it appears as if he was lost to follow up in 2022. Review of his cardiovascular diagnostic testing indicates that patient had an echocardiogram in 05/2021 that demonstrated moderate aortic valve stenosis with a mean transvalvular gradient of 29.3 mmHg; AVA (VTI) = 1.37 cm2. This was a new finding when compared to previous echocardiogram performed in 2020.  Surgeon's office sent for cleared on 11/14/2023, however during his interview today, patient expressed concerns regarding being cleared for surgery, citing the fact that he had not heard from his cardiologist's office. Patient was encouraged to follow up with them via phone to check on the status. I have also sent a message to the preoperative pool regarding need for an in person appointment for preoperative evaluation and clearance.   Impression and Plan:  David Bolton with known aortic valve stenosis. He formerly saw Dr. Peter Swaziland, MD, however it appears as if he was lost to follow up back in 2022. Patient is pending elective inguinal and umbilical hernia repair on 12/04/2023. Patient will need to be seen by cardiology service line and obtain clearance prior to proceeding.   In efforts to expedite patient being cleared for his procedure, I have placed an order for a repeat echocardiogram. I have asked that attempts be made to have this study done before patient is seen by Dr. Swaziland. This will allow cardiologist to evaluate the overall cardiac function and to reassess the  previously demonstrated aortic valve stenosis prior to upcoming elective surgery.   Surgery is currently scheduled for 12/04/2023 at this time. Unclear of availability for patient to have non-invasive cardiovascular study and be seen by cardiology prior to this date. No changes are being made to the OR schedule at this time. Will follow up on appointment details and disposition from cardiology regarding clearance for surgery.   Orders Placed This Encounter  Procedures   ECHOCARDIOGRAM COMPLETE    Standing Status:   Future    Expiration Date:   11/25/2024    Where should this test be performed:   [facility]    Perflutren DEFINITY (image enhancing agent) should be administered unless hypersensitivity or allergy exist:   Administer Perflutren    Reason for exam-Echo:   Aortic stenosis I35.0    Reason for exam-Echo:   Pre-operative cardiovascular examination Z01.810   Quentin Mulling, MSN, APRN, FNP-C, CEN [facility]  Perioperative Services Nurse Practitioner Phone: [phone] Fax: [phone] 11/26/23 2:42 PM  NOTE: This note has been prepared using Dragon dictation software. Despite my best ability to proofread, there is always the potential that unintentional transcriptional errors may still occur from this process.

## 2023-11-26 NOTE — Telephone Encounter (Signed)
Will update the requesting office to see notes from Register, [facility]. Per Dr. Swaziland pt needs echo and a f/u appt before he can be cleared. Per preop APP surgery may need to be post pone.

## 2023-11-27 ENCOUNTER — Ambulatory Visit (HOSPITAL_COMMUNITY): Payer: Medicare Other | Attending: Cardiology

## 2023-11-27 ENCOUNTER — Encounter
Admission: RE | Admit: 2023-11-27 | Discharge: 2023-11-27 | Discharge status: Home / Self Care | Payer: Medicare Other | Source: Ambulatory Visit | Attending: Surgery

## 2023-11-27 DIAGNOSIS — Z01810 Encounter for preprocedural cardiovascular examination: Secondary | ICD-10-CM | POA: Insufficient documentation

## 2023-11-27 DIAGNOSIS — I119 Hypertensive heart disease without heart failure: Secondary | ICD-10-CM | POA: Diagnosis not present

## 2023-11-27 DIAGNOSIS — R001 Bradycardia, unspecified: Secondary | ICD-10-CM | POA: Diagnosis not present

## 2023-11-27 DIAGNOSIS — E785 Hyperlipidemia, unspecified: Secondary | ICD-10-CM | POA: Diagnosis not present

## 2023-11-27 DIAGNOSIS — I10 Essential (primary) hypertension: Secondary | ICD-10-CM

## 2023-11-27 DIAGNOSIS — I350 Nonrheumatic aortic (valve) stenosis: Secondary | ICD-10-CM | POA: Insufficient documentation

## 2023-11-27 DIAGNOSIS — I5189 Other ill-defined heart diseases: Secondary | ICD-10-CM

## 2023-11-27 DIAGNOSIS — I359 Nonrheumatic aortic valve disorder, unspecified: Secondary | ICD-10-CM | POA: Insufficient documentation

## 2023-11-27 DIAGNOSIS — Z01818 Encounter for other preprocedural examination: Secondary | ICD-10-CM | POA: Insufficient documentation

## 2023-11-27 DIAGNOSIS — E119 Type 2 diabetes mellitus without complications: Secondary | ICD-10-CM | POA: Diagnosis not present

## 2023-11-27 HISTORY — DX: Other ill-defined heart diseases: I51.89

## 2023-11-27 LAB — ECHOCARDIOGRAM COMPLETE
AR max vel: 0.97 cm2
AV Area VTI: 1.05 cm2
AV Area mean vel: 0.98 cm2
AV Mean grad: 42 mm[Hg]
AV Peak grad: 77.8 mm[Hg]
Ao pk vel: 4.41 m/s
Area-P 1/2: 2.29 cm2
S' Lateral: 2.9 cm

## 2023-11-27 NOTE — Progress Notes (Unsigned)
Cardiology Office Note:    Date:  12/03/2023   ID:  David Bolton, DOB 05-Jun-1941, MRN [id number]  PCP:  Corwin Levins, MD  Cardiologist:  Peter Swaziland, MD     Referring MD: Corwin Levins, MD   Chief Complaint: pre-op evaluatoin  History of Present Illness:    David Bolton is a 83 y.o. male with a history of severe aortic stenosis/ mild to moderate aortic regurgitation, dilated ascending aorta, hypertension, hyperlipidemia, type 2 diabetes mellitus, GERD, IBS, and chronic neck/ back pain who is followed by Dr. Swaziland and presents today for pre-op evaluation.   Patient was referred to Dr. Swaziland in 04/2021 for further evaluation of aortic stenosis. Remote Myoview in 2006 was negative and prior Echo in 05/2019 showed moderate stenosis. years prior to this showed moderate aortic stenosis. He denied any cardiac symptoms at that time. Repeat Echo was ordered and showed LVEF of 60-65% with normal wall motion and grade 1 diastolic dysfunction, normal RV size and function, moderate AS, trivial MR, and mild dilatation of the ascending aorta measuring 45 mm. Plan was for repeat Echo in 1 year but he was lost to follow-up.   We received a pre-op clearance from on 11/26/2023 for upcoming robotic assisted inguinal hernia and umbilical hernia repair. Therefore, repeat Echo was ordered and then this visit was arranged for further evaluation. Echo on 11/27/2023 showed LVEF of 60-65% with no regional wall motion abnormalities and grade 1 diastolic dysfunction, moderately enlarged RV with normal RV function, severe AS/ mild to moderate AI (mean gradient 42.0 mmHg), and mild dilatation of the ascending aorta measuring 42 mm.   Today, he states he is doing very well from a cardiac standpoint. He walks 5 miles every day and plays basketball with no issues. He denies any chest pain, shortness of breath, orthopnea, PND, edema, palpitations, lightheadedness, dizziness, or syncope. He has some cervical neck issues  and will get some numbness/ tinglings from this every once in a while. He states is only real complaint today is pain from his hernia. He is frustrated that his hernia repair is going to be delayed for further cardiac work-up.  EKGs/Labs/Other Studies Reviewed:    The following studies were reviewed:  Echocardiogram 11/17/2023: Impressions: 1. Left ventricular ejection fraction, by estimation, is 60 to 65%. Left  ventricular ejection fraction by 3D volume is 64 %. The left ventricle has  normal function. The left ventricle has no regional wall motion  abnormalities. There is mild concentric  left ventricular hypertrophy. Left ventricular diastolic parameters are  consistent with Grade I diastolic dysfunction (impaired relaxation).   2. Right ventricular systolic function is normal. The right ventricular  size is moderately enlarged. There is normal pulmonary artery systolic  pressure.   3. Left atrial size was mildly dilated.   4. Right atrial size was mild to moderately dilated.   5. The mitral valve is degenerative. No evidence of mitral valve  regurgitation. No evidence of mitral stenosis.   6. The aortic valve is calcified. Aortic valve regurgitation is mild to  moderate. Severe aortic valve stenosis. Aortic valve mean gradient  measures 42.0 mmHg. Aortic valve Vmax measures 4.41 m/s.   7. Aortic dilatation noted. There is mild dilatation of the ascending  aorta, measuring 42 mm.   8. The inferior vena cava is normal in size with greater than 50%  respiratory variability, suggesting right atrial pressure of 3 mmHg.   Comparison(s): Prior images reviewed side by side.  Mean gradient has  increased from 31 mm Hg to 42 mm Hg. Severe aortic regurgitation now  present.   EKG:  EKG not ordered today. Recent EKG on 11/27/2023 showed sinus bradycardia, rate 58 bpm, with LVH but no acute ST/ T wave changes.  Recent Labs: 03/23/2023: Magnesium 1.8; TSH 1.431 10/16/2023: ALT 12; BUN 12;  Creatinine, Ser 0.79; Hemoglobin 14.8; Platelets 249.0; Potassium 4.2; Sodium 137  Recent Lipid Panel    Component Value Date/Time   CHOL 125 10/16/2023 1007   TRIG 99.0 10/16/2023 1007   TRIG 250 (HH) 10/31/2006 1628   HDL 54.80 10/16/2023 1007   CHOLHDL 2 10/16/2023 1007   VLDL 19.8 10/16/2023 1007   LDLCALC 50 10/16/2023 1007   LDLCALC 105 (H) 06/01/2020 0943   LDLDIRECT 139.0 02/10/2011 0916    Physical Exam:    Vital Signs: BP (!) 148/98   Pulse (!) 55   Ht 5\' 8"  (1.727 m)   Wt 164 lb (74.4 kg)   SpO2 95%   BMI 24.94 kg/m     Wt Readings from Last 3 Encounters:  12/03/23 164 lb (74.4 kg)  11/14/23 168 lb (76.2 kg)  10/16/23 162 lb (73.5 kg)     General: 83 y.o. Caucasian  male in no acute distress. HEENT: Normocephalic and atraumatic. Sclera clear.  Neck: Supple. No JVD. Heart: RRR. III/ VI systolic murmur.  Lungs: No increased work of breathing. Clear to ausculation bilaterally. No wheezes, rhonchi, or rales.  Abdomen: Soft, non-distended, and non-tender to palpation.  Extremities: No lower extremity edema.  Radial pulses 2+ and equal bilaterally. Skin: Warm and dry. Neuro: No focal deficits. Psych: Normal affect. Responds appropriately.  Assessment:    1. Pre-op evaluation   2. Severe aortic stenosis   3. Ascending aorta dilatation (HCC)   4. Primary hypertension   5. Hyperlipidemia, unspecified hyperlipidemia type   6. Type 2 diabetes mellitus without obesity (HCC)     Plan:    Pre-Op Evaluation Patient has upcoming robotic assisted inguinal hernia and umbilical hernia repair . Recent Echo showed progression of his aortic stenosis. He now has severe aortic stenosis with mean gradient of 42.0 mmHg. However, he is doing very well and is completely asymptomatic from cardiac standpoint. He is able to walk 5 miles a day, play basketball, play golf, and mop his floors without any issues. Per Revised Cardiac Risk Index, 6.0% risk of major cardiac event  perioperatively. However, this does not take into account his advanced age or severe aortic stenosis so risk is likely higher. Initial plan was going to be for a R/ LHC cardiac catheterization. However,  since he is completely asymptomatic, Dr. Swaziland said OK to proceed with surgery without this. Recommend avoiding hypotension perioperatively. I will route this note to surgeon (Dr. Aleen Campi).   Severe Aortic Stenosis Mild to Moderate Aortic Insufficiency  Patient has a history of moderate AS dating back to at least 2020. Recent Echo on 11/27/2023 showed LVEF of 60-65% with no regional wall motion abnormalities and grade 1 diastolic dysfunction, moderately enlarged RV with normal RV function, and severe AS/ mild to moderate AI (mean gradient 42.0 mmHg). - Patient is completely asymptomatic.  - I did explain that he would likely need valve surgery in the future but he states he expressed that he would not want this unless absolutely necessary.  - Discussed with Dr. Swaziland - will plan to monitor closely with repeat Echo every 6 months. Discussed symptoms of aortic stenosis with patient and  advised him to let us know if he develops any of these.  Dilated Ascending Aorta Recent Echo on 11/27/2023 showed mild dilatation of the ascending aorta measuring 42 mm.  - Can reassess with repeat Echo in 6 months.   Hypertension BP mildly elevated. Initially 148/98 and then 142/72 on my personal recheck at the end of visit. He states BP is usually mildly elevated in the morning but then responds well to his morning medications. - Continue current medications: Amlodipine 5mg  daily, Lisinopril 40mg  daily, and Toprol-XL 50mg  daily.   Hyperlipidemia Lipid panel in 09/2023: Total Cholesterol 125, Triglycerides 99, HDL 54.8, LDL 50.  - Continue Lipitor 20mg  daily.  - Labs followed by PCP.  Type 2 Diabetes Mellitus Hemoglobin A1c 6.4% in 09/2023.  - On Metformin.  - Management per primary team.   Disposition:  Follow up in 6 months after Echo.   Signed, Corrin Parker, PA-C  12/03/2023 10:07 AM    [facility]

## 2023-11-27 NOTE — Telephone Encounter (Signed)
Patient called to confirm he will keep his echocardiogram test appointment today at 12:50 pm and his follow-up visit on Monday, 2/3 at 8:50 am.

## 2023-11-27 NOTE — Telephone Encounter (Signed)
Patient needs appt scheduled ASAP. Patient needs echocardiogram scheduled ASAP. Tereso Newcomer, PA-C    11/27/2023 8:25 AM

## 2023-11-27 NOTE — Telephone Encounter (Signed)
I will forward to pre op APP to review notes from Dr. Aleen Campi. Pt last seen 05/25/21. I s/w bryan Wallace Cullens, FNP Monday 11/26/23 and stated that I did not see that we received a clearance. Bryan resent STAT. Since then our pre op team has been reviewing what is needed to safely provide clearance for upcoming surgery with Dr. Aleen Campi. Per Dr. Swaziland pt is going to need an echo as well as an appt post echo due to pt has not been seen since 2022.

## 2023-11-27 NOTE — Telephone Encounter (Signed)
I will update all providers pt has been scheduled for an echo today. Follow up appt with Marjie Skiff, [facility] 12/03/23 @ 8:50.

## 2023-11-28 ENCOUNTER — Encounter: Payer: Self-pay | Admitting: Urgent Care

## 2023-11-29 ENCOUNTER — Telehealth: Payer: Self-pay | Admitting: Surgery

## 2023-11-29 NOTE — Telephone Encounter (Signed)
Patient is requesting return call to get update on clearance. Please advise.

## 2023-11-29 NOTE — Telephone Encounter (Addendum)
Pt will need to keep his follow up appt 12/03/23 with Marjie Skiff, PAC before he can be cleared. I s/w the pt and he has been made aware that clearance will be d/w Marjie Skiff, [facility] 12/03/23. I assured pt that Dr. Aleen Campi is aware as well. Pt thanked me for the call and the help.

## 2023-11-29 NOTE — Telephone Encounter (Signed)
Patient informed that surgery with Dr. Aleen Campi for 12/04/23 is being postponed for now.  Patient had echo done on 11/28/23 and cardiology team will need to do further work up.  Patient advised to contact his cardiologist today.

## 2023-11-29 NOTE — Telephone Encounter (Signed)
I will forward this to Marjie Skiff, [facility] for appt with pt on Monday 12/03/23

## 2023-12-03 ENCOUNTER — Encounter: Payer: Self-pay | Admitting: Student

## 2023-12-03 ENCOUNTER — Ambulatory Visit: Payer: Medicare Other | Attending: Student | Admitting: Student

## 2023-12-03 VITALS — BP 148/98 | HR 55 | Ht 68.0 in | Wt 164.0 lb

## 2023-12-03 DIAGNOSIS — Z01818 Encounter for other preprocedural examination: Secondary | ICD-10-CM

## 2023-12-03 DIAGNOSIS — I10 Essential (primary) hypertension: Secondary | ICD-10-CM

## 2023-12-03 DIAGNOSIS — I77810 Thoracic aortic ectasia: Secondary | ICD-10-CM | POA: Diagnosis not present

## 2023-12-03 DIAGNOSIS — E119 Type 2 diabetes mellitus without complications: Secondary | ICD-10-CM | POA: Diagnosis not present

## 2023-12-03 DIAGNOSIS — I350 Nonrheumatic aortic (valve) stenosis: Secondary | ICD-10-CM

## 2023-12-03 DIAGNOSIS — E785 Hyperlipidemia, unspecified: Secondary | ICD-10-CM | POA: Diagnosis not present

## 2023-12-03 NOTE — Patient Instructions (Addendum)
Medication Instructions:  The current medical regimen is effective;  continue present plan and medications as directed. Please refer to the Current Medication list given to you today.  *If you need a refill on your cardiac medications before your next appointment, please call your pharmacy*  Lab Work: NONE  Testing/Procedures: Echocardiogram -IN 6 MONTHS- Your physician has requested that you have an echocardiogram. Echocardiography is a painless test that uses sound waves to create images of your heart. It provides your doctor with information about the size and shape of your heart and how well your heart's chambers and valves are working. This procedure takes approximately one hour. There are no restrictions for this procedure.   Follow-Up: At [facility], you and your health needs are our priority.  As part of our continuing mission to provide you with exceptional heart care, we have created designated Provider Care Teams.  These Care Teams include your primary Cardiologist (physician) and Advanced Practice Providers (APPs -  Physician Assistants and Nurse Practitioners) who all work together to provide you with the care you need, when you need it.  Your next appointment:   AFTER ECHO   Provider:   Peter [place], MD  or Marjie Skiff, PA-C

## 2023-12-04 ENCOUNTER — Encounter: Admission: RE | Payer: Self-pay | Source: Ambulatory Visit

## 2023-12-04 ENCOUNTER — Telehealth: Payer: Self-pay | Admitting: Surgery

## 2023-12-04 ENCOUNTER — Ambulatory Visit: Admission: RE | Admit: 2023-12-04 | Payer: Medicare Other | Source: Ambulatory Visit | Admitting: Surgery

## 2023-12-04 SURGERY — HERNIORRHAPHY, INGUINAL, ROBOT-ASSISTED, LAPAROSCOPIC
Anesthesia: General | Laterality: Right

## 2023-12-04 NOTE — Telephone Encounter (Signed)
 Updated information regarding rescheduled surgery with Dr. Desiderio.  Patient has been advised of Pre-Admission date/time, and Surgery date at [facility].  Surgery Date: 12/07/23 Preadmission Testing Date: 12/06/23 (phone 8a-1p)  Patient has been made aware to call [phone], between 1-3:00pm the day before surgery, to find out what time to arrive for surgery.

## 2023-12-05 NOTE — Progress Notes (Signed)
 Perioperative / Anesthesia Services  Pre-Admission Testing Clinical Review / Pre-Operative Anesthesia Consult  Date: 12/06/23  Patient Demographics:  Name: David Bolton DOB: [birth year] MRN:   [id number]  Planned Surgical Procedure(s):    Case: [id number] Date/Time: 12/07/23 1245   Procedures:      XI ROBOTIC ASSISTED INGUINAL HERNIA (Right)     HERNIA REPAIR UMBILICAL ADULT, open   Anesthesia type: General   Pre-op diagnosis:      right inguinal hernia reducible     umbilical hernia reducible less 3 cm   Location: [facility] OR ROOM 07 / [facility] ORS FOR ANESTHESIA GROUP   Surgeons: Desiderio Schanz, MD      NOTE: Available PAT nursing documentation and vital signs have been reviewed. Clinical nursing staff has updated patient's PMH/PSHx, current medication list, and drug allergies/intolerances to ensure comprehensive history available to assist in medical decision making as it pertains to the aforementioned surgical procedure and anticipated anesthetic course. Extensive review of available clinical information personally performed. [place] PMH and PSHx updated with any diagnoses/procedures that  may have been inadvertently omitted during his intake with the pre-admission testing department's nursing staff.  Clinical Discussion:  David Bolton is a 83 y.o. male who is submitted for pre-surgical anesthesia review and clearance prior to him undergoing the above procedure. Patient has never been a smoker in the past. Pertinent PMH includes: aortic stenosis, dysfunction, aortic root dilatation, aortic atherosclerosis, HTN, HLD, T2DM, GERD (no daily Tx), RIGHT inguinal hernia, umbilical hernia, cervical DDD, multilevel neuroforaminal stenosis, chronic lower back pain, IBS, anxiety, depression.  Patient is followed by cardiology (Jordan, MD). He was last seen in the cardiology clinic on 12/03/2023; notes reviewed. At the time of his clinic visit, patient doing well overall from a cardiovascular  perspective. Patient denied any chest pain, shortness of breath, PND, orthopnea, palpitations, significant peripheral edema, weakness, fatigue, vertiginous symptoms, or presyncope/syncope. Patient with a past medical history significant for cardiovascular diagnoses. Documented physical exam was grossly benign, providing no evidence of acute exacerbation and/or decompensation of the patient's known cardiovascular conditions.  Myocardial perfusion imaging study was performed on 10/03/2005 revealing a normal left ventricular systolic function with a hyperdynamic LVEF of 67%.  There were no regional wall motion abnormalities.  Patient noted to have good exercise capacity.  He demonstrated a hypertensive response to stress.  Patient able to achieve a maximum heart rate of 136 bpm (MPHR 158 bpm).  Achieved a total of 10.1 METS during the study.  Overall impression was that patient had a small area of inferior wall ischemia from the apex to base.  Patient diagnosed with aortic valve stenosis in 05/2019, at which time his mean transvalvular pressure gradient was 23 mmHg; AVA (VTI) 1.33 cm.  Patient has undergone serial monitoring of his cardiac function.  Patient lost to follow-up with cardiology for a short period, thus became behind on monitoring.  During preoperative workup, this was noted and patient was sent for repeat TTE and follow-up with cardiology.    TTE performed on 11/27/2023 revealed a normal left ventricular systolic function with an EF of 60-65%.  There were no regional wall motion abnormalities.  There was mild concentric LVH. Left ventricular diastolic Doppler parameters consistent with abnormal relaxation (G1DD).  Right ventricular size and function normal.  RVSP = 26.2 mmHg.  The left atrium was mildly dilated and the right atrium was mild to moderately dilated.  Mitral valve degenerative with no evidence of regurgitation.  There was mild to moderate  aortic valve regurgitation.  Degree of aortic  valve stenosis had progressed to severe.  Patient's mean transaortic pressure gradient now 42 mmHg; AVA (VTI) 1.05 cm.  All other transvalvular gradients were noted be normal.  Mild dilatation of the ascending aorta to 42 mm was also noted.  Blood pressure elevated at 148/98 on currently prescribed CCB (amlodipine ), ACEi (lisinopril ) and beta-blocker (metoprolol  succinate) on currently prescribed  therapies.  Patient is on atorvastatin  for his HLD diagnosis and ASCVD prevention. T2DM well controlled on currently prescribed regimen; last HgbA1c was 6.4% when checked on 10/16/2023. He does not have an OSAH diagnosis.  Patient maintains an active lifestyle.  He reports that he walks 5 miles every day and plays basketball with no issues.  He is able to complete all of his ADLs/IADLs independently without cardiovascular limitation.  Per the [facility], patient able to exceed 4 METS of physical activity without experiencing any significant degrees of angina/anginal equivalent symptoms.  Discussed progression of his aortic valve stenosis and need for further evaluation via right/left cardiac catheterization.  Patient verbalizes that he is doing very well and is upset regarding the need to postpone his elective hernia surgery for further cardiovascular workup.  After reviewing recent studies with primary cardiologist, cardiology APP advised that catheterization could be postponed.  She did discuss that with patient's stenotic progression of his aortic valve, he will likely need aortic valve replacement in the future.  Patient verbalized understanding, however wished to proceed with hernia repair at this time.  Patient to undergo follow-up echocardiogram in 6 months, with further plans to follow.  No changes were made to his medication regimen during his visit with cardiology.  Patient scheduled to follow-up with outpatient cardiology in 6 months or sooner if needed.  Roi G Corrigan is scheduled for an elective XI ROBOTIC  ASSISTED INGUINAL HERNIA REPAIR (Right); UMBILICAL HERNIA REPAIR UMBILICAL on 12/07/2023 with Dr. Elnoria Plant.  Given patient's past medical history significant for cardiovascular diagnoses, presurgical cardiac clearance was sought by the PAT team.  Per cardiology, patient has upcoming robotic assisted inguinal hernia and umbilical hernia repair . Recent Echo showed progression of his aortic stenosis. He now has severe aortic stenosis with mean gradient of 42.0 mmHg. However, he is doing very well and is completely asymptomatic from cardiac standpoint. He is able to walk 5 miles a day, play basketball, play golf, and mop his floors without any issues. Per Revised Cardiac Risk Index, 6.0% risk of major cardiac event perioperatively. However, this does not take into account his advanced age or severe aortic stenosis so risk is likely higher. Initial plan was going to be for a R/ LHC cardiac catheterization. However,  since he is completely asymptomatic, Dr. Jordan said OK to proceed with surgery without this at an ACCEPTABLE risk. Recommend avoiding hypotension perioperatively.  In review of his medication reconciliation, the patient is not noted to be taking any type of anticoagulation or antiplatelet therapies that would need to be held during his perioperative course.  Patient denies previous perioperative complications with anesthesia in the past. In review his EMR, there are no records available for review pertaining to any anesthetic courses within the [facility] system in the recent past.      12/03/2023    8:51 AM 11/14/2023    2:09 PM 10/16/2023    9:03 AM  Vitals with BMI  Height 5' 8 5' 8 5' 8  Weight 164 lbs 168 lbs 162 lbs  BMI 24.94 25.55 24.64  Systolic 148 150 861  Diastolic 98 69 82  Pulse 55 66 55   Providers/Specialists:  NOTE: Primary physician provider listed below. Patient may have been seen by APP or partner within same practice.   PROVIDER ROLE / SPECIALTY LAST [facility]ERLEAN Desiderio Schanz, MD General Surgery (Surgeon) 11/14/2023  Norleen Lynwood ORN, MD Primary Care Provider 10/16/2023  Jordan, Peter, MD Cardiology 12/03/2023   Allergies:   Allergies  Allergen Reactions   Sulfa Antibiotics Rash    Breaks out around private parts   Bee Venom Swelling    Yellow Jacket   Doxycycline Hyclate    Peanut-Containing Drug Products     Nose bleed, headache   Sulfonamide Derivatives    Current Home Medications:    amLODipine  (NORVASC ) 5 MG tablet   atorvastatin  (LIPITOR) 20 MG tablet   Cholecalciferol (VITAMIN D3) 125 MCG (5000 UT) TABS   diphenhydrAMINE (BENADRYL) 25 MG tablet   HYDROcodone -acetaminophen  (NORCO) 10-325 MG tablet   lisinopril  (ZESTRIL ) 40 MG tablet   metFORMIN  (GLUCOPHAGE -XR) 500 MG 24 hr tablet   metoprolol  succinate (TOPROL -XL) 50 MG 24 hr tablet   Fe Bisgly-Vit C-Vit B12-FA (GENTLE IRON) 28-60-0.008-0.4 MG CAPS   Misc Natural Products (GLUCOSAMINE CHONDROITIN TRIPLE PO)   Multiple Vitamin (MULTIVITAMIN) capsule   Saw Palmetto 450 MG CAPS   No current facility-administered medications for this encounter.   History:   Past Medical History:  Diagnosis Date   ALLERGIC RHINITIS 06/08/2007   ANXIETY 06/08/2007   Aortic atherosclerosis (HCC)    Aortic root dilation (HCC) 06/28/2021   a.) TTE 06/28/2021: 45 mm; b.) TTE 11/27/2023: 42 mm   Aortic stenosis 06/06/2019   a.) TTE 06/06/2019: mod AS (MPG 23 mmHg; AVA (VTI) =1.33 cm2); b.) TTE 06/28/2021: mod AS (MPG 29.3 mmHg; AVA (VTI) = 1.37 cm2); b.) TTE 11/27/2023: severe AS (MPG 42 mmHg; AVA (VTI) = 1.05 cm2)   Chronic neck pain    COLONIC POLYPS, HX OF 06/08/2007   Cough 08/12/2010   DDD (degenerative disc disease), cervical    DEPENDENCE, ALCOHOL NEC/NOS, IN REMISSION 06/08/2007   DEPRESSION 06/08/2007   Diastolic dysfunction 11/27/2023   a.) TTE 11/27/2023: EF 60-65%, mild LVH, G1DD, mild RAE/RVE, mild LAE, AoV calcified with mild AR, sev AS (MPG 42 mmHg), asc AO 42 mm    DIVERTICULOSIS, COLON 06/08/2007   FATIGUE 09/10/2008   GERD 06/08/2007   Granulomatous disease (HCC)    a.) calcified granulomata LEFT upper lobe, LEFT lung base, and in spleen   Headache(784.0) 06/08/2007   Hemorrhoids    HYPERLIPIDEMIA 06/08/2007   HYPERTENSION 06/08/2007   IBS 06/08/2007   LOW BACK PAIN, CHRONIC 08/12/2010   Multilevel neural foraminal stenosis    Nocturia 07/16/2009   PILONIDAL CYST 06/08/2007   Pilonidal disease s/p excision    PLANTAR FASCIITIS 06/08/2007   PSA elevation    Right inguinal hernia    T2DM (type 2 diabetes mellitus) (HCC)    Umbilical hernia    Past Surgical History:  Procedure Laterality Date   BLADDER SURGERY     CERVICAL FUSION     COLONOSCOPY     PILONIDAL CYST EXCISION     Family History  Problem Relation Age of Onset   Heart disease Mother        mature heart disease   Heart attack Paternal Aunt    Kidney disease Other    Colon cancer Neg Hx    Colon polyps Neg Hx    Rectal cancer Neg Hx  Stomach cancer Neg Hx    Social History   Tobacco Use   Smoking status: Never    Passive exposure: Never   Smokeless tobacco: Never  Substance Use Topics   Alcohol use: No   Pertinent Clinical Results:  LABS:  Lab Results  Component Value Date   WBC 6.3 10/16/2023   HGB 14.8 10/16/2023   HCT 44.7 10/16/2023   MCV 93.3 10/16/2023   PLT 249.0 10/16/2023   Lab Results  Component Value Date   NA 137 10/16/2023   CL 103 10/16/2023   K 4.2 10/16/2023   CO2 27 10/16/2023   BUN 12 10/16/2023   CREATININE 0.79 10/16/2023   GFR 84.18 10/16/2023   CALCIUM  9.1 10/16/2023   ALBUMIN 4.2 10/16/2023   GLUCOSE 114 (H) 10/16/2023    ECG: Date: 11/27/2023  Time ECG obtained: 0833 AM Rate: 58 bpm Rhythm: sinus bradycardia Axis (leads I and aVF): normal Intervals: PR 178 ms. QRS 90 ms. QTc 453 ms. ST segment and T wave changes: No evidence of acute T wave abnormalities or significant ST segment elevation or depression.  Evidence  of a possible, age undetermined, prior infarct:  Yes;  septal Comparison: Similar to previous tracing obtained on 03/23/2023   IMAGING / PROCEDURES: TRANSTHORACIC ECHOCARDIOGRAM performed on 11/27/2023 Left ventricular ejection fraction, by estimation, is 60 to 65%. Left ventricular ejection fraction by 3D volume is 64 %. The left ventricle has normal function. The left ventricle has no regional wall motion abnormalities. There is mild concentric left ventricular hypertrophy. Left ventricular diastolic parameters are consistent with Grade I diastolic dysfunction (impaired relaxation).  Right ventricular systolic function is normal. The right ventricular size is moderately enlarged. There is normal pulmonary artery systolic pressure.  Left atrial size was mildly dilated.  Right atrial size was mild to moderately dilated.  The mitral valve is degenerative. No evidence of mitral valve regurgitation. No evidence of mitral stenosis.  The aortic valve is calcified. Aortic valve regurgitation is mild to moderate. Severe aortic valve stenosis. Aortic valve mean gradient measures 42.0 mmHg. Aortic valve Vmax measures 4.41 m/s.  Aortic dilatation noted. There is mild dilatation of the ascending aorta, measuring 42 mm.  The inferior vena cava is normal in size with greater than 50% respiratory variability, suggesting right atrial pressure of 3 mmHg.   CT RENAL STONE STUDY performed on 10/22/2023 Right inguinal hernia containing a segment of distal ileum. No evidence to suggest incarceration or strangulation. No acute findings in the abdomen or pelvis. No evidence of urinary tract calculi or hydronephrosis. Diffuse diverticulosis of the entire colon without diverticulitis. Prostatic enlargement with estimated prostate volume of 83 mL. Aortic atherosclerosis without aneurysm. Evidence of prior granulomatous disease with calcified granulomata at the left lung base and in the spleen.  CT CERVICAL SPINE WO  CONTRAST performed on 03/23/2023 No evidence of acute intracranial abnormality. 1.1 cm left thyroid  nodule for which no follow-up imaging is recommended.  Mild calcific atherosclerosis at the carotid bifurcations. No acute cervical spine fracture. Advanced cervical disc and facet degeneration. Grade 1 anterolisthesis of C2 on C3, C3 on C4, and C7 on T1. Interbody and facet ankylosis at C4-5 and C5-6.  Moderate to severe disc space narrowing and degenerative endplate changes at C6-7 and moderate disc degeneration at C7-T1 Severe multilevel facet arthrosis. Moderate to severe multilevel neural foraminal stenosis. No evidence of high-grade spinal canal stenosis.   Impression and Plan:  Suheyb G Buechele has been referred for pre-anesthesia review and clearance  prior to him undergoing the planned anesthetic and procedural courses. Available labs, pertinent testing, and imaging results were personally reviewed by me in preparation for upcoming operative/procedural course. [facility] medical record has been updated following extensive record review and patient interview with PAT staff.   This patient has been appropriately cleared by cardiology with an overall ACCEPTABLE risk of experiencing significant perioperative cardiovascular complications. Based on clinical review performed today (12/06/23), barring any significant acute changes in the patient's overall condition, it is anticipated that he will be able to proceed with the planned surgical intervention. Any acute changes in clinical condition may necessitate his procedure being postponed and/or cancelled. Patient will meet with anesthesia team (MD and/or CRNA) on the day of his procedure for preoperative evaluation/assessment. Questions regarding anesthetic course will be fielded at that time.   Pre-surgical instructions were reviewed with the patient during his PAT appointment, and questions were fielded to satisfaction by PAT clinical staff. He has  been instructed on which medications that he will need to hold prior to surgery, as well as the ones that have been deemed safe/appropriate to take on the day of his procedure. As part of the general education provided by PAT, patient made aware both verbally and in writing, that he would need to abstain from the use of any illegal substances during his perioperative course. He was advised that failure to follow the provided instructions could necessitate case cancellation or result in serious perioperative complications up to and including death. Patient encouraged to contact PAT and/or his surgeon's office to discuss any questions or concerns that may arise prior to surgery; verbalized understanding.   Dorise Pereyra, MSN, APRN, FNP-C, CEN [facility]  Perioperative Services Nurse Practitioner Phone: [phone] Fax: [phone] 12/06/23 1:58 PM  NOTE: This note has been prepared using Dragon dictation software. Despite my best ability to proofread, there is always the potential that unintentional transcriptional errors may still occur from this process.

## 2023-12-06 ENCOUNTER — Encounter
Admission: RE | Admit: 2023-12-06 | Discharge: 2023-12-06 | Disposition: A | Discharge status: Home / Self Care | Payer: Medicare Other | Source: Ambulatory Visit | Attending: Surgery | Admitting: Surgery

## 2023-12-06 HISTORY — DX: Other specified disorders of the skin and subcutaneous tissue: L98.8

## 2023-12-06 HISTORY — DX: Spinal stenosis, site unspecified: M48.00

## 2023-12-06 HISTORY — DX: Type 2 diabetes mellitus without complications: E11.9

## 2023-12-06 HISTORY — DX: Functional disorders of polymorphonuclear neutrophils: D71

## 2023-12-06 HISTORY — DX: Other cervical disc degeneration, unspecified cervical region: M50.30

## 2023-12-06 NOTE — Patient Instructions (Signed)
 Your procedure is scheduled on:12-07-23 Friday Report to the Registration Desk on the 1st floor of the [facility].Then proceed to the 2nd floor Surgery Desk To find out your arrival time, please call [phone] between 1PM - 3PM on:12-06-23 Thursday If your arrival time is 6:00 am, do not arrive before that time as the [facility] entrance doors do not open until 6:00 am.  REMEMBER: Instructions that are not followed completely may result in serious medical risk, up to and including death; or upon the discretion of your surgeon and anesthesiologist your surgery may need to be rescheduled.  Do not eat food after midnight the night before surgery.  No gum chewing or hard candies.  You may however, drink Water up to 2 hours before you are scheduled to arrive for your surgery. Do not drink anything within 2 hours of your scheduled arrival time.  One week prior to surgery:Stop NOW 12-06-23  Stop Anti-inflammatories (NSAIDS) such as Advil, Aleve, Ibuprofen, Motrin, Naproxen, Naprosyn and Aspirin based products such as Excedrin, Goody's Powder, BC Powder. Stop ANY OVER THE COUNTER supplements until after surgery (Vitamin D3,Gentle Iron, Gucosamine-Chondroitin, Multivitamin, Saw Palmetto-these were stopped on 11-27-23)  You may however, continue to take Tylenol  if needed for pain up until the day of surgery.  Last dose of metFORMIN  (GLUCOPHAGE -XR) was on 12-04-23 Tuesday  Continue taking all of your other prescription medications up until the day of surgery.  ON THE DAY OF SURGERY ONLY TAKE THESE MEDICATIONS WITH SIPS OF WATER: -amLODipine  (NORVASC )  -metoprolol  succinate (TOPROL -XL)   No Alcohol for 24 hours before or after surgery.  No Smoking including e-cigarettes for 24 hours before surgery.  No chewable tobacco products for at least 6 hours before surgery.  No nicotine patches on the day of surgery.  Do not use any recreational drugs for at least a week (preferably 2 weeks) before your  surgery.  Please be advised that the combination of cocaine and anesthesia may have negative outcomes, up to and including death. If you test positive for cocaine, your surgery will be cancelled.  On the morning of surgery brush your teeth with toothpaste and water, you may rinse your mouth with mouthwash if you wish. Do not swallow any toothpaste or mouthwash.  Use CHG Soap as directed on instruction sheet.  Do not wear jewelry, make-up, hairpins, clips or nail polish.  For welded (permanent) jewelry: bracelets, anklets, waist bands, etc.  Please have this removed prior to surgery.  If it is not removed, there is a chance that hospital personnel will need to cut it off on the day of surgery.  Do not wear lotions, powders, or perfumes.   Do not shave body hair from the neck down 48 hours before surgery.  Contact lenses, hearing aids and dentures may not be worn into surgery.  Do not bring valuables to the hospital. [facility] is not responsible for any missing/lost belongings or valuables.   Notify your doctor if there is any change in your medical condition (cold, fever, infection).  Wear comfortable clothing (specific to your surgery type) to the hospital.  After surgery, you can help prevent lung complications by doing breathing exercises.  Take deep breaths and cough every 1-2 hours. Your doctor may order a device called an Incentive Spirometer to help you take deep breaths. When coughing or sneezing, hold a pillow firmly against your incision with both hands. This is called "splinting." Doing this helps protect your incision. It also decreases belly discomfort.  If you are being admitted to the hospital overnight, leave your suitcase in the car. After surgery it may be brought to your room.  In case of increased patient census, it may be necessary for you, the patient, to continue your postoperative care in the Same Day Surgery department.  If you are being discharged the day  of surgery, you will not be allowed to drive home. You will need a responsible individual to drive you home and stay with you for 24 hours after surgery.   If you are taking public transportation, you will need to have a responsible individual with you.  Please call the Pre-admissions Testing Dept. at [phone] if you have any questions about these instructions.  Surgery Visitation Policy:  Patients having surgery or a procedure may have two visitors.  Children under the age of 31 must have an adult with them who is not the patient.  Temporary Visitor Restrictions Due to increasing cases of flu, RSV and COVID-19: Children ages 38 and under will not be able to visit patients in [facility] hospitals under most circumstances.     Preparing for Surgery with CHLORHEXIDINE  GLUCONATE (CHG) Soap  Chlorhexidine  Gluconate (CHG) Soap  o An antiseptic cleaner that kills germs and bonds with the skin to continue killing germs even after washing  o Used for showering the night before surgery and morning of surgery  Before surgery, you can play an important role by reducing the number of germs on your skin.  CHG (Chlorhexidine  gluconate) soap is an antiseptic cleanser which kills germs and bonds with the skin to continue killing germs even after washing.  Please do not use if you have an allergy to CHG or antibacterial soaps. If your skin becomes reddened/irritated stop using the CHG.  1. Shower the NIGHT BEFORE SURGERY and the MORNING OF SURGERY with CHG soap.  2. If you choose to wash your hair, wash your hair first as usual with your normal shampoo.  3. After shampooing, rinse your hair and body thoroughly to remove the shampoo.  4. Use CHG as you would any other liquid soap. You can apply CHG directly to the skin and wash gently with a scrungie or a clean washcloth.  5. Apply the CHG soap to your body only from the neck down. Do not use on open wounds or open sores. Avoid contact  with your eyes, ears, mouth, and genitals (private parts). Wash face and genitals (private parts) with your normal soap.  6. Wash thoroughly, paying special attention to the area where your surgery will be performed.  7. Thoroughly rinse your body with warm water.  8. Do not shower/wash with your normal soap after using and rinsing off the CHG soap.  9. Pat yourself dry with a clean towel.  10. Wear clean pajamas to bed the night before surgery.  12. Place clean sheets on your bed the night of your first shower and do not sleep with pets.  13. Shower again with the CHG soap on the day of surgery prior to arriving at the hospital.  14. Do not apply any deodorants/lotions/powders.  15. Please wear clean clothes to the hospital.

## 2023-12-07 ENCOUNTER — Other Ambulatory Visit: Payer: Self-pay

## 2023-12-07 ENCOUNTER — Ambulatory Visit: Payer: Medicare Other

## 2023-12-07 ENCOUNTER — Ambulatory Visit: Payer: Medicare Other | Admitting: Urgent Care

## 2023-12-07 ENCOUNTER — Encounter: Payer: Self-pay | Admitting: Surgery

## 2023-12-07 ENCOUNTER — Encounter
Admission: RE | Disposition: A | Discharge status: Home / Self Care | Payer: Self-pay | Source: Ambulatory Visit | Attending: Surgery

## 2023-12-07 ENCOUNTER — Ambulatory Visit
Admission: RE | Admit: 2023-12-07 | Discharge: 2023-12-07 | Disposition: A | Discharge status: Home / Self Care | Payer: Medicare Other | Source: Ambulatory Visit | Attending: Surgery | Admitting: Surgery

## 2023-12-07 DIAGNOSIS — K5730 Diverticulosis of large intestine without perforation or abscess without bleeding: Secondary | ICD-10-CM | POA: Diagnosis not present

## 2023-12-07 DIAGNOSIS — Z7984 Long term (current) use of oral hypoglycemic drugs: Secondary | ICD-10-CM | POA: Diagnosis not present

## 2023-12-07 DIAGNOSIS — K429 Umbilical hernia without obstruction or gangrene: Secondary | ICD-10-CM | POA: Diagnosis not present

## 2023-12-07 DIAGNOSIS — E785 Hyperlipidemia, unspecified: Secondary | ICD-10-CM | POA: Diagnosis not present

## 2023-12-07 DIAGNOSIS — I10 Essential (primary) hypertension: Secondary | ICD-10-CM | POA: Diagnosis not present

## 2023-12-07 DIAGNOSIS — K4090 Unilateral inguinal hernia, without obstruction or gangrene, not specified as recurrent: Secondary | ICD-10-CM | POA: Diagnosis not present

## 2023-12-07 DIAGNOSIS — K219 Gastro-esophageal reflux disease without esophagitis: Secondary | ICD-10-CM | POA: Insufficient documentation

## 2023-12-07 DIAGNOSIS — I700 Atherosclerosis of aorta: Secondary | ICD-10-CM | POA: Insufficient documentation

## 2023-12-07 DIAGNOSIS — E119 Type 2 diabetes mellitus without complications: Secondary | ICD-10-CM | POA: Insufficient documentation

## 2023-12-07 DIAGNOSIS — Z01818 Encounter for other preprocedural examination: Secondary | ICD-10-CM

## 2023-12-07 HISTORY — PX: UMBILICAL HERNIA REPAIR: SHX196

## 2023-12-07 HISTORY — PX: INSERTION OF MESH: SHX5868

## 2023-12-07 LAB — GLUCOSE, CAPILLARY
Glucose-Capillary: 117 mg/dL — ABNORMAL HIGH (ref 70–99)
Glucose-Capillary: 152 mg/dL — ABNORMAL HIGH (ref 70–99)

## 2023-12-07 SURGERY — HERNIORRHAPHY, INGUINAL, ROBOT-ASSISTED, LAPAROSCOPIC
Anesthesia: General | Laterality: Right

## 2023-12-07 MED ORDER — ONDANSETRON HCL 4 MG/2ML IJ SOLN: 4.0000 mg | Freq: Once | INTRAMUSCULAR | Status: DC | PRN

## 2023-12-07 MED ORDER — BUPIVACAINE-EPINEPHRINE (PF) 0.5% -1:200000 IJ SOLN
INTRAMUSCULAR | Status: DC | PRN
  Administered 2023-12-07: 30 mL

## 2023-12-07 MED ORDER — PROPOFOL 10 MG/ML IV BOLUS
INTRAVENOUS | Status: AC
  Filled 2023-12-07: qty 20

## 2023-12-07 MED ORDER — PHENYLEPHRINE 80 MCG/ML (10ML) SYRINGE FOR IV PUSH (FOR BLOOD PRESSURE SUPPORT)
PREFILLED_SYRINGE | INTRAVENOUS | Status: DC | PRN
  Administered 2023-12-07 (×4): 80 ug via INTRAVENOUS

## 2023-12-07 MED ORDER — BUPIVACAINE LIPOSOME 1.3 % IJ SUSP: 20.0000 mL | Freq: Once | INTRAMUSCULAR | Status: DC

## 2023-12-07 MED ORDER — FENTANYL CITRATE (PF) 100 MCG/2ML IJ SOLN
INTRAMUSCULAR | Status: AC
  Filled 2023-12-07: qty 2

## 2023-12-07 MED ORDER — EPHEDRINE SULFATE-NACL 50-0.9 MG/10ML-% IV SOSY
PREFILLED_SYRINGE | INTRAVENOUS | Status: DC | PRN
  Administered 2023-12-07 (×3): 5 mg via INTRAVENOUS

## 2023-12-07 MED ORDER — SUCCINYLCHOLINE CHLORIDE 200 MG/10ML IV SOSY
PREFILLED_SYRINGE | INTRAVENOUS | Status: AC
  Filled 2023-12-07: qty 10

## 2023-12-07 MED ORDER — MIDAZOLAM HCL 2 MG/2ML IJ SOLN
INTRAMUSCULAR | Status: AC
  Filled 2023-12-07: qty 2

## 2023-12-07 MED ORDER — PHENYLEPHRINE 80 MCG/ML (10ML) SYRINGE FOR IV PUSH (FOR BLOOD PRESSURE SUPPORT)
PREFILLED_SYRINGE | INTRAVENOUS | Status: AC
  Filled 2023-12-07: qty 10

## 2023-12-07 MED ORDER — EPHEDRINE 5 MG/ML INJ
INTRAVENOUS | Status: AC
  Filled 2023-12-07: qty 5

## 2023-12-07 MED ORDER — KETOROLAC TROMETHAMINE 30 MG/ML IJ SOLN
INTRAMUSCULAR | Status: AC
  Filled 2023-12-07: qty 1

## 2023-12-07 MED ORDER — CHLORHEXIDINE GLUCONATE 0.12 % MT SOLN
OROMUCOSAL | Status: AC
  Filled 2023-12-07: qty 15

## 2023-12-07 MED ORDER — MIDAZOLAM HCL 2 MG/2ML IJ SOLN
INTRAMUSCULAR | Status: DC | PRN
  Administered 2023-12-07: 1 mg via INTRAVENOUS

## 2023-12-07 MED ORDER — CHLORHEXIDINE GLUCONATE CLOTH 2 % EX PADS: 6.0000 | MEDICATED_PAD | Freq: Once | CUTANEOUS | Status: DC

## 2023-12-07 MED ORDER — ONDANSETRON HCL 4 MG/2ML IJ SOLN
INTRAMUSCULAR | Status: AC
  Filled 2023-12-07: qty 2

## 2023-12-07 MED ORDER — ONDANSETRON HCL 4 MG/2ML IJ SOLN
INTRAMUSCULAR | Status: DC | PRN
  Administered 2023-12-07: 4 mg via INTRAVENOUS

## 2023-12-07 MED ORDER — SUGAMMADEX SODIUM 200 MG/2ML IV SOLN
INTRAVENOUS | Status: DC | PRN
  Administered 2023-12-07: 160 mg via INTRAVENOUS

## 2023-12-07 MED ORDER — ACETAMINOPHEN 500 MG PO TABS: 1000.0000 mg | ORAL_TABLET | Freq: Four times a day (QID) | ORAL | Status: AC | PRN

## 2023-12-07 MED ORDER — ACETAMINOPHEN 500 MG PO TABS
ORAL_TABLET | ORAL | Status: AC
  Filled 2023-12-07: qty 2

## 2023-12-07 MED ORDER — ROCURONIUM BROMIDE 100 MG/10ML IV SOLN
INTRAVENOUS | Status: DC | PRN
  Administered 2023-12-07: 20 mg via INTRAVENOUS
  Administered 2023-12-07: 50 mg via INTRAVENOUS

## 2023-12-07 MED ORDER — CEFAZOLIN SODIUM-DEXTROSE 2-4 GM/100ML-% IV SOLN
INTRAVENOUS | Status: AC
  Filled 2023-12-07: qty 100

## 2023-12-07 MED ORDER — PROPOFOL 10 MG/ML IV BOLUS
INTRAVENOUS | Status: DC | PRN
  Administered 2023-12-07: 80 mg via INTRAVENOUS

## 2023-12-07 MED ORDER — KETOROLAC TROMETHAMINE 30 MG/ML IJ SOLN
INTRAMUSCULAR | Status: DC | PRN
  Administered 2023-12-07: 15 mg via INTRAVENOUS

## 2023-12-07 MED ORDER — OXYCODONE HCL 5 MG PO TABS: 5.0000 mg | ORAL_TABLET | ORAL | 0 refills | Status: DC | PRN

## 2023-12-07 MED ORDER — ROCURONIUM BROMIDE 10 MG/ML (PF) SYRINGE
PREFILLED_SYRINGE | INTRAVENOUS | Status: AC
  Filled 2023-12-07: qty 10

## 2023-12-07 MED ORDER — FENTANYL CITRATE (PF) 100 MCG/2ML IJ SOLN
INTRAMUSCULAR | Status: DC | PRN
  Administered 2023-12-07 (×2): 50 ug via INTRAVENOUS

## 2023-12-07 MED ORDER — BUPIVACAINE LIPOSOME 1.3 % IJ SUSP
INTRAMUSCULAR | Status: DC | PRN
  Administered 2023-12-07: 10 mL

## 2023-12-07 MED ORDER — DEXAMETHASONE SODIUM PHOSPHATE 10 MG/ML IJ SOLN
INTRAMUSCULAR | Status: DC | PRN
  Administered 2023-12-07: 10 mg via INTRAVENOUS

## 2023-12-07 MED ORDER — BUPIVACAINE LIPOSOME 1.3 % IJ SUSP
INTRAMUSCULAR | Status: AC
  Filled 2023-12-07: qty 10

## 2023-12-07 MED ORDER — DEXAMETHASONE SODIUM PHOSPHATE 10 MG/ML IJ SOLN
INTRAMUSCULAR | Status: AC
  Filled 2023-12-07: qty 1

## 2023-12-07 MED ORDER — BUPIVACAINE HCL (PF) 0.5 % IJ SOLN
INTRAMUSCULAR | Status: AC
  Filled 2023-12-07: qty 30

## 2023-12-07 MED ORDER — OXYCODONE HCL 5 MG PO TABS
5.0000 mg | ORAL_TABLET | Freq: Once | ORAL | Status: AC | PRN
  Administered 2023-12-07: 5 mg via ORAL

## 2023-12-07 MED ORDER — OXYCODONE HCL 5 MG PO TABS
ORAL_TABLET | ORAL | Status: AC
  Filled 2023-12-07: qty 1

## 2023-12-07 MED ORDER — CEFAZOLIN SODIUM-DEXTROSE 2-4 GM/100ML-% IV SOLN
2.0000 g | INTRAVENOUS | Status: AC
  Administered 2023-12-07: 2 g via INTRAVENOUS

## 2023-12-07 MED ORDER — GLYCOPYRROLATE 0.2 MG/ML IJ SOLN
INTRAMUSCULAR | Status: AC
  Filled 2023-12-07: qty 1

## 2023-12-07 MED ORDER — LIDOCAINE HCL (PF) 2 % IJ SOLN
INTRAMUSCULAR | Status: AC
  Filled 2023-12-07: qty 5

## 2023-12-07 MED ORDER — SODIUM CHLORIDE 0.9 % IV SOLN: INTRAVENOUS | Status: DC

## 2023-12-07 MED ORDER — GABAPENTIN 300 MG PO CAPS
300.0000 mg | ORAL_CAPSULE | ORAL | Status: AC
  Administered 2023-12-07: 300 mg via ORAL

## 2023-12-07 MED ORDER — SUCCINYLCHOLINE CHLORIDE 200 MG/10ML IV SOSY
PREFILLED_SYRINGE | INTRAVENOUS | Status: DC | PRN
  Administered 2023-12-07: 80 mg via INTRAVENOUS

## 2023-12-07 MED ORDER — ORAL CARE MOUTH RINSE: 15.0000 mL | Freq: Once | OROMUCOSAL | Status: AC

## 2023-12-07 MED ORDER — PHENYLEPHRINE HCL-NACL 20-0.9 MG/250ML-% IV SOLN
INTRAVENOUS | Status: DC | PRN
  Administered 2023-12-07: 20 ug/min via INTRAVENOUS

## 2023-12-07 MED ORDER — EPINEPHRINE PF 1 MG/ML IJ SOLN
INTRAMUSCULAR | Status: AC
  Filled 2023-12-07: qty 1

## 2023-12-07 MED ORDER — GABAPENTIN 300 MG PO CAPS
ORAL_CAPSULE | ORAL | Status: AC
  Filled 2023-12-07: qty 1

## 2023-12-07 MED ORDER — ACETAMINOPHEN 500 MG PO TABS
1000.0000 mg | ORAL_TABLET | ORAL | Status: AC
  Administered 2023-12-07: 1000 mg via ORAL

## 2023-12-07 MED ORDER — OXYCODONE HCL 5 MG/5ML PO SOLN: 5.0000 mg | Freq: Once | ORAL | Status: AC | PRN

## 2023-12-07 MED ORDER — CHLORHEXIDINE GLUCONATE CLOTH 2 % EX PADS
6.0000 | MEDICATED_PAD | Freq: Once | CUTANEOUS | Status: AC
  Administered 2023-12-07: 6 via TOPICAL

## 2023-12-07 MED ORDER — CHLORHEXIDINE GLUCONATE 0.12 % MT SOLN
15.0000 mL | Freq: Once | OROMUCOSAL | Status: AC
  Administered 2023-12-07: 15 mL via OROMUCOSAL

## 2023-12-07 MED ORDER — PHENYLEPHRINE HCL-NACL 20-0.9 MG/250ML-% IV SOLN
INTRAVENOUS | Status: AC
  Filled 2023-12-07: qty 250

## 2023-12-07 MED ORDER — FENTANYL CITRATE (PF) 100 MCG/2ML IJ SOLN
25.0000 ug | INTRAMUSCULAR | Status: DC | PRN
  Administered 2023-12-07 (×3): 50 ug via INTRAVENOUS

## 2023-12-07 MED ORDER — LIDOCAINE HCL (CARDIAC) PF 100 MG/5ML IV SOSY
PREFILLED_SYRINGE | INTRAVENOUS | Status: DC | PRN
  Administered 2023-12-07: 40 mg via INTRAVENOUS

## 2023-12-07 SURGICAL SUPPLY — 57 items
BLADE SURG 15 STRL LF DISP TIS (BLADE) ×4 IMPLANT
CHLORAPREP W/TINT 26 (MISCELLANEOUS) ×4 IMPLANT
COVER TIP SHEARS 8 DVNC (MISCELLANEOUS) ×4 IMPLANT
COVER WAND RF STERILE (DRAPES) ×4 IMPLANT
DERMABOND ADVANCED .7 DNX12 (GAUZE/BANDAGES/DRESSINGS) ×4 IMPLANT
DRAPE ARM DVNC X/XI (DISPOSABLE) ×12 IMPLANT
DRAPE COLUMN DVNC XI (DISPOSABLE) ×4 IMPLANT
DRAPE LAPAROTOMY 77X122 PED (DRAPES) ×4 IMPLANT
ELECT CAUTERY BLADE TIP 2.5 (TIP) ×3
ELECT REM PT RETURN 9FT ADLT (ELECTROSURGICAL) ×3
ELECTRODE CAUTERY BLDE TIP 2.5 (TIP) ×4 IMPLANT
ELECTRODE REM PT RTRN 9FT ADLT (ELECTROSURGICAL) ×4 IMPLANT
FORCEPS BPLR R/ABLATION 8 DVNC (INSTRUMENTS) ×4 IMPLANT
GAUZE 4X4 16PLY ~~LOC~~+RFID DBL (SPONGE) ×4 IMPLANT
GLOVE SURG SYN 7.0 (GLOVE) ×9
GLOVE SURG SYN 7.0 PF PI (GLOVE) ×8 IMPLANT
GLOVE SURG SYN 7.5 E (GLOVE) ×9
GLOVE SURG SYN 7.5 PF PI (GLOVE) ×8 IMPLANT
GOWN STRL REUS W/ TWL LRG LVL3 (GOWN DISPOSABLE) ×16 IMPLANT
IRRIGATION STRYKERFLOW (MISCELLANEOUS) ×4 IMPLANT
IRRIGATOR STRYKERFLOW (MISCELLANEOUS)
IV NS 1000ML BAXH (IV SOLUTION) IMPLANT
KIT PINK PAD W/HEAD ARE REST (MISCELLANEOUS) ×3
KIT PINK PAD W/HEAD ARM REST (MISCELLANEOUS) ×4 IMPLANT
LABEL OR SOLS (LABEL) ×4 IMPLANT
MANIFOLD NEPTUNE II (INSTRUMENTS) ×4 IMPLANT
MESH 3DMAX MID 4X6 RT LRG (Mesh General) IMPLANT
NDL DRIVE SUT CUT DVNC (INSTRUMENTS) ×4 IMPLANT
NDL HYPO 22X1.5 SAFETY MO (MISCELLANEOUS) ×4 IMPLANT
NDL INSUFFLATION 14GA 120MM (NEEDLE) ×4 IMPLANT
NEEDLE DRIVE SUT CUT DVNC (INSTRUMENTS) ×3
NEEDLE HYPO 22X1.5 SAFETY MO (MISCELLANEOUS) ×3
NEEDLE INSUFFLATION 14GA 120MM (NEEDLE) ×3
NS IRRIG 500ML POUR BTL (IV SOLUTION) ×4 IMPLANT
OBTURATOR OPTICAL STND 8 DVNC (TROCAR) ×3
OBTURATOR OPTICALSTD 8 DVNC (TROCAR) ×4 IMPLANT
PACK BASIN MINOR ARMC (MISCELLANEOUS) ×4 IMPLANT
PACK LAP CHOLECYSTECTOMY (MISCELLANEOUS) ×4 IMPLANT
SCISSORS MNPLR CVD DVNC XI (INSTRUMENTS) ×4 IMPLANT
SEAL UNIV 5-12 XI (MISCELLANEOUS) ×12 IMPLANT
SET TUBE SMOKE EVAC HIGH FLOW (TUBING) ×4 IMPLANT
SOL ELECTROSURG ANTI STICK (MISCELLANEOUS) ×3
SOLUTION ELECTROSURG ANTI STCK (MISCELLANEOUS) ×4 IMPLANT
SPONGE T-LAP 18X18 ~~LOC~~+RFID (SPONGE) ×4 IMPLANT
SUT ETHIBOND 0 MO6 C/R (SUTURE) ×4 IMPLANT
SUT MNCRL AB 4-0 PS2 18 (SUTURE) ×4 IMPLANT
SUT STRATA 2-0 23CM CT-2 (SUTURE) ×4 IMPLANT
SUT VIC AB 2-0 SH 27XBRD (SUTURE) ×8 IMPLANT
SUT VIC AB 3-0 SH 27X BRD (SUTURE) ×4 IMPLANT
SUT VICRYL 0 UR6 27IN ABS (SUTURE) ×8 IMPLANT
SYR 20ML LL LF (SYRINGE) ×4 IMPLANT
SYS BAG RETRIEVAL 10MM (BASKET)
SYSTEM BAG RETRIEVAL 10MM (BASKET) IMPLANT
TAPE TRANSPORE STRL 2 31045 (GAUZE/BANDAGES/DRESSINGS) ×4 IMPLANT
TRAP FLUID SMOKE EVACUATOR (MISCELLANEOUS) ×4 IMPLANT
TRAY FOLEY SLVR 16FR LF STAT (SET/KITS/TRAYS/PACK) ×4 IMPLANT
WATER STERILE IRR 500ML POUR (IV SOLUTION) ×4 IMPLANT

## 2023-12-07 NOTE — Discharge Instructions (Signed)
 Discharge Instructions: 1.  Patient may shower, but do not scrub wounds heavily and dab dry only. 2.  Do not submerge wounds in pool/tub for approximately 1 week. 3.  Do not apply ointments or hydrogen peroxide to the wounds. 4.  May apply ice packs to the wounds for comfort. 5.  It is normal for there to be bruising or swelling of the right groin and scrotum after this surgery.  This will resolve on its own. 6.  Do not drive while taking narcotics for pain control.  Prior to driving, make sure you are able to rotate right and left to look at blindspots without significant pain or discomfort. 7.  No heavy lifting or pushing of more than 10-15 lbs for 4-6 weeks.  At 4 weeks may start ramping up activity level, and at 6 weeks, can do all activities without restrictions.

## 2023-12-07 NOTE — Interval H&P Note (Signed)
 History and Physical Interval Note:  12/07/2023 12:29 PM  David Bolton  has presented today for surgery, with the diagnosis of right inguinal hernia reducible umbilical hernia reducible less 3 cm.  The various methods of treatment have been discussed with the patient and family. After consideration of risks, benefits and other options for treatment, the patient has consented to  Procedure(s): XI ROBOTIC ASSISTED INGUINAL HERNIA (Right) HERNIA REPAIR UMBILICAL ADULT, open (N/A) INSERTION OF MESH as a surgical intervention.  The patient's history has been reviewed, patient examined, no change in status, stable for surgery.  I have reviewed the patient's chart and labs.  Questions were answered to the patient's satisfaction.     [name]

## 2023-12-07 NOTE — Transfer of Care (Signed)
 Immediate Anesthesia Transfer of Care Note  Patient: David Bolton  Procedure(s) Performed: XI ROBOTIC ASSISTED INGUINAL HERNIA (Right) HERNIA REPAIR UMBILICAL ADULT, open INSERTION OF MESH  Patient Location: PACU  Anesthesia Type:General  Level of Consciousness: awake, alert , and drowsy  Airway & Oxygen Therapy: Patient Spontanous Breathing and Patient connected to face mask oxygen  Post-op Assessment: Report given to RN and Post -op Vital signs reviewed and stable  Post vital signs: Reviewed and stable  Last Vitals:  Vitals Value Taken Time  BP 140/73 12/07/23 1511  Temp 35.8 1511  Pulse 60 12/07/23 1514  Resp 19 12/07/23 1514  SpO2 100 % 12/07/23 1514  Vitals shown include unfiled device data.  Last Pain:  Vitals:   12/07/23 1157  TempSrc: Tympanic  PainSc: 2       Patients Stated Pain Goal: 0 (12/07/23 1157)  Complications: No notable events documented.

## 2023-12-07 NOTE — Anesthesia Preprocedure Evaluation (Signed)
 Anesthesia Evaluation  Patient identified by MRN, date of birth, ID band Patient awake    Reviewed: Allergy & Precautions, NPO status , Patient's Chart, lab work & pertinent test results  History of Anesthesia Complications Negative for: history of anesthetic complications  Airway Mallampati: III  TM Distance: >3 FB Neck ROM: full    Dental no notable dental hx.    Pulmonary neg pulmonary ROS   Pulmonary exam normal        Cardiovascular hypertension, On Medications negative cardio ROS Normal cardiovascular exam  TTE performed on 11/27/2023 revealed a normal left ventricular systolic function with an EF of 60-65%.  There were no regional wall motion abnormalities.  There was mild concentric LVH. Left ventricular diastolic Doppler parameters consistent with abnormal relaxation (G1DD).  Right ventricular size and function normal.  RVSP = 26.2 mmHg.  The left atrium was mildly dilated and the right atrium was mild to moderately dilated.  Mitral valve degenerative with no evidence of regurgitation.  There was mild to moderate aortic valve regurgitation.  Degree of aortic valve stenosis had progressed to severe.  Patient's mean transaortic pressure gradient now 42 mmHg; AVA (VTI) 1.05 cm.  All other transvalvular gradients were noted be normal.  Mild dilatation of the ascending aorta to 42 mm was also noted.  Per cardiology, patient has upcoming robotic assisted inguinal hernia and umbilical hernia repair . Recent Echo showed progression of his aortic stenosis. He now has severe aortic stenosis with mean gradient of 42.0 mmHg. However, he is doing very well and is completely asymptomatic from cardiac standpoint. He is able to walk 5 miles a day, play basketball, play golf, and mop his floors without any issues. Per Revised Cardiac Risk Index, 6.0% risk of major cardiac event perioperatively. However, this does not take into account his advanced  age or severe aortic stenosis so risk is likely higher. Initial plan was going to be for a R/ LHC cardiac catheterization. However,  since he is completely asymptomatic, Dr. Jordan said OK to proceed with surgery without this at an ACCEPTABLE risk. Recommend avoiding hypotension perioperatively.   Neuro/Psych  Headaches PSYCHIATRIC DISORDERS Anxiety Depression       GI/Hepatic Neg liver ROS,GERD  Medicated,,  Endo/Other  negative endocrine ROSdiabetes, Well Controlled, Type 2, Oral Hypoglycemic Agents    Renal/GU      Musculoskeletal   Abdominal   Peds  Hematology negative hematology ROS (+)   Anesthesia Other Findings Past Medical History: 06/08/2007: ALLERGIC RHINITIS 06/08/2007: ANXIETY No date: Aortic atherosclerosis (HCC) 06/28/2021: Aortic root dilation (HCC)     Comment:  a.) TTE 06/28/2021: 45 mm; b.) TTE 11/27/2023: 42 mm 06/06/2019: Aortic stenosis     Comment:  a.) TTE 06/06/2019: mod AS (MPG 23 mmHg; AVA (VTI) =1.33              cm2); b.) TTE 06/28/2021: mod AS (MPG 29.3 mmHg; AVA               (VTI) = 1.37 cm2); b.) TTE 11/27/2023: severe AS (MPG 42               mmHg; AVA (VTI) = 1.05 cm2) No date: Chronic neck pain 06/08/2007: COLONIC POLYPS, HX OF 08/12/2010: Cough No date: DDD (degenerative disc disease), cervical 06/08/2007: DEPENDENCE, ALCOHOL NEC/NOS, IN REMISSION 06/08/2007: DEPRESSION 11/27/2023: Diastolic dysfunction     Comment:  a.) TTE 11/27/2023: EF 60-65%, mild LVH, G1DD, mild  RAE/RVE, mild LAE, AoV calcified with mild AR, sev AS               (MPG 42 mmHg), asc AO 42 mm 06/08/2007: DIVERTICULOSIS, COLON 09/10/2008: FATIGUE 06/08/2007: GERD No date: Granulomatous disease (HCC)     Comment:  a.) calcified granulomata LEFT upper lobe, LEFT lung               base, and in spleen 06/08/2007: Headache(784.0) No date: Hemorrhoids 06/08/2007: HYPERLIPIDEMIA 06/08/2007: HYPERTENSION 06/08/2007: IBS 08/12/2010: LOW BACK PAIN,  CHRONIC No date: Multilevel neural foraminal stenosis 07/16/2009: Nocturia 06/08/2007: PILONIDAL CYST No date: Pilonidal disease s/p excision 06/08/2007: PLANTAR FASCIITIS No date: PSA elevation No date: Right inguinal hernia No date: T2DM (type 2 diabetes mellitus) (HCC) No date: Umbilical hernia  Past Surgical History: No date: BLADDER SURGERY No date: CERVICAL FUSION No date: COLONOSCOPY No date: PILONIDAL CYST EXCISION     Reproductive/Obstetrics negative OB ROS                             Anesthesia Physical Anesthesia Plan  ASA: 3  Anesthesia Plan: General ETT   Post-op Pain Management: Toradol  IV (intra-op)*, Ofirmev  IV (intra-op)* and Dilaudid IV   Induction: Intravenous  PONV Risk Score and Plan: 2 and Ondansetron , Dexamethasone , Midazolam  and Treatment may vary due to age or medical condition  Airway Management Planned: Oral ETT  Additional Equipment:   Intra-op Plan:   Post-operative Plan: Extubation in OR  Informed Consent: I have reviewed the patients History and Physical, chart, labs and discussed the procedure including the risks, benefits and alternatives for the proposed anesthesia with the patient or authorized representative who has indicated his/her understanding and acceptance.     Dental Advisory Given  Plan Discussed with: Anesthesiologist, CRNA and Surgeon  Anesthesia Plan Comments: (Patient consented for risks of anesthesia including but not limited to:  - adverse reactions to medications - damage to eyes, teeth, lips or other oral mucosa - nerve damage due to positioning  - sore throat or hoarseness - Damage to heart, brain, nerves, lungs, other parts of body or loss of life  Patient voiced understanding and assent.)        Anesthesia Quick Evaluation

## 2023-12-07 NOTE — Anesthesia Postprocedure Evaluation (Signed)
 Anesthesia Post Note  Patient: David Bolton  Procedure(s) Performed: XI ROBOTIC ASSISTED INGUINAL HERNIA (Right) HERNIA REPAIR UMBILICAL ADULT, open INSERTION OF MESH  Patient location during evaluation: PACU Anesthesia Type: General Level of consciousness: awake and alert Pain management: pain level controlled Vital Signs Assessment: post-procedure vital signs reviewed and stable Respiratory status: spontaneous breathing, nonlabored ventilation, respiratory function stable and patient connected to nasal cannula oxygen Cardiovascular status: blood pressure returned to baseline and stable Postop Assessment: no apparent nausea or vomiting Anesthetic complications: no   No notable events documented.   Last Vitals:  Vitals:   12/07/23 1618 12/07/23 1720  BP: (!) 143/70 (!) 154/89  Pulse: 63 (!) 57  Resp: 18   Temp: (!) 36.3 C   SpO2: 97% 97%    Last Pain:  Vitals:   12/07/23 1720  TempSrc:   PainSc: 3                  Rome Ade

## 2023-12-07 NOTE — Op Note (Signed)
 Procedure Date:  12/07/2023  Pre-operative Diagnosis:  Right inguinal hernia, umbilical hernia  Post-operative Diagnosis: Right inguinal hernia, umbilical hernia 7 mm.  Procedure: 1.  Robotic assisted Right Inguinal Hernia Repair 2.  Creation of Right Posterior Rectus-Transversalis Fascia Advancment Flap for Coverage of Pelvic Wound (200 cm) 3.  Open umbilical hernia repair  Surgeon:  Aloysius Sheree Plant, MD  Anesthesia:  General endotracheal  Estimated Blood Loss:  10 ml  Specimens:  None  Complications:  None  Indications for Procedure:  This is a 83 y.o. male who presents with a right inguinal hernia.  The options of surgery versus observation were reviewed with the patient and/or family. The risks of bleeding, abscess or infection, recurrence of symptoms, potential for an open procedure, injury to surrounding structures, and chronic pain were all discussed with the patient and he was willing to proceed.  We have planned this transabdominal procedure with the creation of a right peritoneal flap based on the posterior rectus sheath and transversalis fascia in order to fully cover the mesh, creating a natural tisssue barrier for the bowel and peritoneal cavity.  Description of Procedure: The patient was correctly identified in the preoperative area and brought into the operating room.  The patient was placed supine with VTE prophylaxis in place.  Appropriate time-outs were performed.  Anesthesia was induced and the patient was intubated.  Foley catheter was placed.  Appropriate antibiotics were infused.  The abdomen was prepped and draped in a sterile fashion. A supraumbilical incision was made. Cautery was used to dissect along the umbilical stalk and to separate it from the fascia, revealing a 7 mm hernia defect containing preperitoneal fat.  The fascial edges were cleared and the defect was enlarged to accept a 12 mm port.  Pneumoperitoneum was obtained with appropriate opening  pressures.  A Veress needle was used to start dissecting the peritoneal flap.  Two 8-mm robotic ports were placed in the right and left lateral positions under direct visualization.  A large right Bard 3D Max Mesh, a 2-0 Vicryl, and 2-0 vlock suture were placed through the umbilical port under direct visualization.  The [employer] platform was docked onto the patient, the camera was inserted and targeted, and the instruments were placed under direct visualization.  Both inguinal regions were inspected for hernias and it was confirmed that the patient had a right inguinal hernia.  There was no hernia on the left groin.  Using electocautery, the peritoneal and posterior rectus tissue flap was created.  The peritoneum on the right side was scored from the median umbilical ligament laterally towards the ASIS.  The flap was mobilized using robotic scissors and the bipolar instruments, creating a plane along the posterior rectus sheath and transversalis fascia down to the pubic tubercle medially. It was then further mobilized laterally across the inguinal canal and femoral vessels and onto the psoas muscle. The inferior epigastric vessels were identified and preserved. This created a posterior rectus and peritoneal flap measuring roughly 17 cm x 12 cm.  The hernia sac and contents were reduced preserving all structures.  A large right Bard 3D Max Mid mesh was placed with good overlap along all the potential hernia defects and secured in place with 2-0 Vicryl along the medial superomedial and superolateral aspects.  Then, the peritoneal flap was advanced over the mesh and carried over to close the defect. A running 2-0 V lock suture was used to approximate the edge of the flap onto the peritoneum.  All  needles were removed under direct visualization.  The 8- mm ports were removed under direct visualization and the 12 mm port was removed.  The hernia defect was closed using 0 Vicryl suture.  Local anesthetic was infused  in all incisions as well as a right ilioinguinal block.  The umbilical stalk was reattached using 2-0 Vicryl, and the umbilical incision was closed with 3-0 Vicryl and 4-0 Monocryl.  The other port incisions were closed with 4-0 Monocryl.  The wounds were cleaned and sealed with DermaBond.  Foley catheter was removed and the patient was emerged from anesthesia and extubated and brought to the recovery room for further management.  The patient tolerated the procedure well and all counts were correct at the end of the case.   Aloysius Sheree Plant, MD

## 2023-12-07 NOTE — Anesthesia Procedure Notes (Signed)
 Procedure Name: Intubation Date/Time: 12/07/2023 1:06 PM  Performed by: Jackye Spanner, CRNAPre-anesthesia Checklist: Patient identified, Patient being monitored, Timeout performed, Emergency Drugs available and Suction available Patient Re-evaluated:Patient Re-evaluated prior to induction Oxygen Delivery Method: Circle system utilized Preoxygenation: Pre-oxygenation with 100% oxygen Induction Type: IV induction Ventilation: Mask ventilation without difficulty Laryngoscope Size: McGrath and 3 Grade View: Grade I Tube type: Oral Tube size: 7.0 mm Number of attempts: 1 Airway Equipment and Method: Stylet Placement Confirmation: ETT inserted through vocal cords under direct vision, positive ETCO2 and breath sounds checked- equal and bilateral Secured at: 22 cm Tube secured with: Tape Dental Injury: Teeth and Oropharynx as per pre-operative assessment  Comments: Smooth atraumatic intubation, no complications noted.

## 2023-12-10 ENCOUNTER — Encounter (HOSPITAL_COMMUNITY): Payer: Self-pay

## 2023-12-10 ENCOUNTER — Ambulatory Visit (HOSPITAL_COMMUNITY): Admit: 2023-12-10 | Payer: Medicare Other | Admitting: Cardiology

## 2023-12-10 ENCOUNTER — Encounter: Payer: Self-pay | Admitting: Surgery

## 2023-12-10 SURGERY — RIGHT/LEFT HEART CATH AND CORONARY ANGIOGRAPHY
Anesthesia: LOCAL

## 2023-12-18 ENCOUNTER — Ambulatory Visit (INDEPENDENT_AMBULATORY_CARE_PROVIDER_SITE_OTHER): Payer: Medicare Other | Admitting: Physician Assistant

## 2023-12-18 ENCOUNTER — Encounter: Payer: Self-pay | Admitting: Physician Assistant

## 2023-12-18 VITALS — BP 161/88 | HR 64 | Temp 98.6°F | Ht 68.0 in | Wt 162.8 lb

## 2023-12-18 DIAGNOSIS — K429 Umbilical hernia without obstruction or gangrene: Secondary | ICD-10-CM

## 2023-12-18 DIAGNOSIS — Z09 Encounter for follow-up examination after completed treatment for conditions other than malignant neoplasm: Secondary | ICD-10-CM

## 2023-12-18 DIAGNOSIS — K4090 Unilateral inguinal hernia, without obstruction or gangrene, not specified as recurrent: Secondary | ICD-10-CM

## 2023-12-18 NOTE — Progress Notes (Unsigned)
 [facility] POST-OP OFFICE VISIT  12/18/2023  HPI: David Bolton is a 83 y.o. male 11 days s/p robotic assisted laparoscopic right inguinal hernia and open umbilical hernia repair with Dr Aleen Campi   He is doing very well Biggest area of soreness is umbilicus No right inguinal discomfort No fever, chills, nausea, emesis, bowel changes He is tolerating PO Ambulating without issue; returning to his normal walks (just shorter distances) Incisions are healing well; some ecchymosis No other complaints   Vital signs: BP (!) 161/88   Pulse 64   Temp 98.6 F (37 C) (Oral)   Ht 5\' 8"  (1.727 m)   Wt 162 lb 12.8 oz (73.8 kg)   SpO2 98%   BMI 24.75 kg/m    Physical Exam: Constitutional: Well appearing male, NAD Abdomen: Soft, non-tender, non-distended, no rebound/guarding Skin: Laparoscopic incisions are healing well, some healing ecchymosis to right lateral port site, no erythema or drainage   Assessment/Plan: This is a 83 y.o. male 11 days s/p robotic assisted laparoscopic right inguinal hernia and open umbilical hernia repair with Dr Aleen Campi    - Pain control prn; doing well   - Reviewed wound care recommendation  - Reviewed lifting restrictions; 6 weeks total  - He can follow up on as needed basis; He understands to call with questions/concerns  -- Lynden Oxford, PA-C [facility] 12/18/2023, 4:08 PM M-F: 7am - 4pm

## 2023-12-18 NOTE — Patient Instructions (Signed)

## 2023-12-19 ENCOUNTER — Encounter: Payer: Medicare Other | Admitting: Physician Assistant

## 2024-01-04 DIAGNOSIS — H43813 Vitreous degeneration, bilateral: Secondary | ICD-10-CM | POA: Diagnosis not present

## 2024-01-04 DIAGNOSIS — H40003 Preglaucoma, unspecified, bilateral: Secondary | ICD-10-CM | POA: Diagnosis not present

## 2024-01-04 DIAGNOSIS — H2513 Age-related nuclear cataract, bilateral: Secondary | ICD-10-CM | POA: Diagnosis not present

## 2024-01-04 LAB — HM DIABETES EYE EXAM

## 2024-01-07 ENCOUNTER — Other Ambulatory Visit: Payer: Self-pay | Admitting: Internal Medicine

## 2024-03-04 ENCOUNTER — Other Ambulatory Visit: Payer: Self-pay | Admitting: Internal Medicine

## 2024-04-03 ENCOUNTER — Ambulatory Visit: Payer: Medicare Other

## 2024-04-03 VITALS — BP 130/78 | HR 55 | Ht 68.0 in | Wt 171.0 lb

## 2024-04-03 DIAGNOSIS — E119 Type 2 diabetes mellitus without complications: Secondary | ICD-10-CM | POA: Diagnosis not present

## 2024-04-03 DIAGNOSIS — Z0000 Encounter for general adult medical examination without abnormal findings: Secondary | ICD-10-CM | POA: Diagnosis not present

## 2024-04-03 NOTE — Progress Notes (Signed)
 Subjective:   David Bolton is a 83 y.o. who presents for a Medicare Wellness preventive visit.  As a reminder, Annual Wellness Visits don't include a physical exam, and some assessments may be limited, especially if this visit is performed virtually. We may recommend an in-person follow-up visit with your provider if needed.  Visit Complete: In person  Persons Participating in Visit: Patient.  AWV Questionnaire: No: Patient Medicare AWV questionnaire was not completed prior to this visit.  Cardiac Risk Factors include: advanced age (>65men, >20 women);male gender;hypertension;diabetes mellitus;dyslipidemia     Objective:     Today's Vitals   04/03/24 1141  BP: 130/78  Pulse: (!) 55  SpO2: 99%  Weight: 171 lb (77.6 kg)  Height: 5\' 8"  (1.727 m)   Body mass index is 26 kg/m.     04/03/2024   11:50 AM 12/07/2023   11:36 AM 11/26/2023    1:50 PM 10/03/2023    3:13 PM 04/03/2023   11:15 AM 03/23/2023    4:00 PM 03/14/2021    4:37 PM  Advanced Directives  Does Patient Have a Medical Advance Directive? Yes No No No No No Yes  Type of Estate agent of [place];Living will      Living will;Healthcare Power of Attorney  Does patient want to make changes to medical advance directive?       No - Patient declined  Copy of Healthcare Power of Attorney in Chart? No - copy requested      No - copy requested  Would patient like information on creating a medical advance directive?  No - Patient declined  No - Patient declined No - Patient declined No - Patient declined     Current Medications (verified) Outpatient Encounter Medications as of 04/03/2024  Medication Sig   acetaminophen  (TYLENOL ) 500 MG tablet Take 2 tablets (1,000 mg total) by mouth every 6 (six) hours as needed for mild pain (pain score 1-3).   amLODipine  (NORVASC ) 5 MG tablet TAKE ONE TABLET BY MOUTH ONCE A DAY   atorvastatin  (LIPITOR) 20 MG tablet TAKE ONE TABLET BY MOUTH ONE TIME DAILY (Patient  taking differently: Take 20 mg by mouth at bedtime.)   Cholecalciferol (VITAMIN D3) 125 MCG (5000 UT) TABS Take 5,000 Units by mouth daily.   diphenhydrAMINE (BENADRYL) 25 MG tablet Take 25 mg by mouth every 6 (six) hours as needed for itching or allergies.   HYDROcodone -acetaminophen  (NORCO) 10-325 MG tablet TAKE ONE-HALF TO ONE TABLET BY MOUTH AT BEDTIME AS NEEDED   lisinopril  (ZESTRIL ) 40 MG tablet TAKE ONE TABLET BY MOUTH ONCE A DAY   metFORMIN  (GLUCOPHAGE -XR) 500 MG 24 hr tablet TAKE THREE TABLETS BY MOUTH IN THE MORNING WITH BREAKFAST (Patient taking differently: 500 mg daily with breakfast. TAKE THREE TABLETS BY MOUTH IN THE MORNING WITH BREAKFAST)   metoprolol  succinate (TOPROL -XL) 50 MG 24 hr tablet TAKE ONE HALF TABLET BY MOUTH ONE TIME DAILY (Patient taking differently: 50 mg every morning. TAKE ONE HALF TABLET BY MOUTH ONE TIME DAILY)   No facility-administered encounter medications on file as of 04/03/2024.    Allergies (verified) Sulfa antibiotics, Bee venom, Doxycycline hyclate, Peanut-containing drug products, and Sulfonamide derivatives   History: Past Medical History:  Diagnosis Date   ALLERGIC RHINITIS 06/08/2007   ANXIETY 06/08/2007   Aortic atherosclerosis (HCC)    Aortic root dilation (HCC) 06/28/2021   a.) TTE 06/28/2021: 45 mm; b.) TTE 11/27/2023: 42 mm   Aortic stenosis 06/06/2019   a.) TTE 06/06/2019:  mod AS (MPG 23 mmHg; AVA (VTI) =1.33 cm2); b.) TTE 06/28/2021: mod AS (MPG 29.3 mmHg; AVA (VTI) = 1.37 cm2); b.) TTE 11/27/2023: severe AS (MPG 42 mmHg; AVA (VTI) = 1.05 cm2)   Chronic neck pain    COLONIC POLYPS, HX OF 06/08/2007   Cough 08/12/2010   DDD (degenerative disc disease), cervical    DEPENDENCE, ALCOHOL NEC/NOS, IN REMISSION 06/08/2007   DEPRESSION 06/08/2007   Diastolic dysfunction 11/27/2023   a.) TTE 11/27/2023: EF 60-65%, mild LVH, G1DD, mild RAE/RVE, mild LAE, AoV calcified with mild AR, sev AS (MPG 42 mmHg), asc AO 42 mm   DIVERTICULOSIS, COLON  06/08/2007   FATIGUE 09/10/2008   GERD 06/08/2007   Granulomatous disease (HCC)    a.) calcified granulomata LEFT upper lobe, LEFT lung base, and in spleen   Headache(784.0) 06/08/2007   Hemorrhoids    HYPERLIPIDEMIA 06/08/2007   HYPERTENSION 06/08/2007   IBS 06/08/2007   LOW BACK PAIN, CHRONIC 08/12/2010   Multilevel neural foraminal stenosis    Nocturia 07/16/2009   PILONIDAL CYST 06/08/2007   Pilonidal disease s/p excision    PLANTAR FASCIITIS 06/08/2007   PSA elevation    Right inguinal hernia    T2DM (type 2 diabetes mellitus) (HCC)    Umbilical hernia    Past Surgical History:  Procedure Laterality Date   BLADDER SURGERY     CERVICAL FUSION     COLONOSCOPY     INSERTION OF MESH  12/07/2023   Procedure: INSERTION OF MESH;  Surgeon: Emmalene Hare, MD;  Location: [facility] ORS;  Service: General;;   PILONIDAL CYST EXCISION     UMBILICAL HERNIA REPAIR N/A 12/07/2023   Procedure: HERNIA REPAIR UMBILICAL ADULT, open;  Surgeon: Emmalene Hare, MD;  Location: [facility] ORS;  Service: General;  Laterality: N/A;   Family History  Problem Relation Age of Onset   Heart disease Mother        mature heart disease   Heart attack Paternal Aunt    Kidney disease Other    Colon cancer Neg Hx    Colon polyps Neg Hx    Rectal cancer Neg Hx    Stomach cancer Neg Hx    Social History   Socioeconomic History   Marital status: Divorced    Spouse name: Not on file   Number of children: 3   Years of education: Not on file   Highest education level: Not on file  Occupational History   Occupation: retired Development worker, international aid delivery  Tobacco Use   Smoking status: Never    Passive exposure: Never   Smokeless tobacco: Never  Vaping Use   Vaping status: Never Used  Substance and Sexual Activity   Alcohol use: No   Drug use: Never   Sexual activity: Not on file  Other Topics Concern   Not on file  Social History Narrative   Lives alone/2025   Social Drivers of Health    Financial Resource Strain: Low Risk  (04/03/2024)   Overall Financial Resource Strain (CARDIA)    Difficulty of Paying Living Expenses: Not hard at all  Food Insecurity: No Food Insecurity (04/03/2024)   Hunger Vital Sign    Worried About Running Out of Food in the Last Year: Never true    Ran Out of Food in the Last Year: Never true  Transportation Needs: No Transportation Needs (04/03/2024)   PRAPARE - Administrator, Civil Service (Medical): No    Lack of Transportation (Non-Medical): No  Physical  Activity: Sufficiently Active (04/03/2024)   Exercise Vital Sign    Days of Exercise per Week: 7 days    Minutes of Exercise per Session: 60 min  Stress: No Stress Concern Present (04/03/2024)   [employer] of Occupational Health - Occupational Stress Questionnaire    Feeling of Stress : Not at all  Social Connections: Socially Isolated (04/03/2024)   Social Connection and Isolation Panel [NHANES]    Frequency of Communication with Friends and Family: Three times a week    Frequency of Social Gatherings with Friends and Family: More than three times a week    Attends Religious Services: Never    Database administrator or Organizations: No    Attends Banker Meetings: Never    Marital Status: Widowed    Tobacco Counseling Counseling given: Not Answered    Clinical Intake:  Pre-visit preparation completed: Yes  Pain : No/denies pain     BMI - recorded: 26 Nutritional Status: BMI 25 -29 Overweight Nutritional Risks: None Diabetes: Yes Did pt. bring in CBG monitor from home?: No  Lab Results  Component Value Date   HGBA1C 6.4 10/16/2023   HGBA1C 6.3 01/09/2023   HGBA1C 6.6 (H) 03/20/2022     How often do you need to have someone help you when you read instructions, pamphlets, or other written materials from your doctor or pharmacy?: 1 - Never  Interpreter Needed?: No  Information entered by :: Brinna Divelbiss, RMA   Activities of Daily Living      04/03/2024   11:43 AM 11/26/2023    1:40 PM  In your present state of health, do you have any difficulty performing the following activities:  Hearing? 0   Vision? 0   Difficulty concentrating or making decisions? 0   Walking or climbing stairs? 0   Dressing or bathing? 0   Doing errands, shopping? 0 0  Preparing Food and eating ? N   Using the Toilet? N   In the past six months, have you accidently leaked urine? N   Do you have problems with loss of bowel control? N   Managing your Medications? N   Managing your Finances? N   Housekeeping or managing your Housekeeping? N     Patient Care Team: Roslyn Coombe, MD as PCP - General [place], Peter M, MD as PCP - Cardiology (Cardiology) Sandor Crosser, OD as Referring Physician (Optometry)  I have updated your Care Teams any recent Medical Services you may have received from other providers in the past year.     Assessment:    This is a routine wellness examination for [place].  Hearing/Vision screen Hearing Screening - Comments:: Denies hearing difficulties   Vision Screening - Comments:: Denies vision issues./[facility]    Goals Addressed             This Visit's Progress    My goal  is to gain some weight at least 5 pounds.   On track    He has lost 3 lbs so far/2025       Depression Screen     04/03/2024   11:58 AM 10/16/2023    9:03 AM 04/03/2023   11:14 AM 01/09/2023   11:44 AM 03/20/2022    8:49 AM 07/25/2021    8:45 AM 03/14/2021    4:35 PM  PHQ 2/9 Scores  PHQ - 2 Score 0 0 0 1 0 0 0  PHQ- 9 Score 0  1  Fall Risk     04/03/2024   11:51 AM 11/14/2023    2:09 PM 10/16/2023    9:02 AM 04/03/2023   11:15 AM 01/09/2023   11:44 AM  Fall Risk   Falls in the past year? 0 0 0 0 0  Number falls in past yr: 0 0 0 0 0  Injury with Fall? 0 0 0 0 0  Risk for fall due to : No Fall Risks  No Fall Risks No Fall Risks   Follow up Falls evaluation completed;Falls prevention discussed  Falls evaluation  completed Falls prevention discussed     MEDICARE RISK AT HOME:  Medicare Risk at Home Any stairs in or around the home?: Yes (lives on 2nd floor) If so, are there any without handrails?: Yes Home free of loose throw rugs in walkways, pet beds, electrical cords, etc?: Yes Adequate lighting in your home to reduce risk of falls?: Yes Life alert?: No Use of a cane, walker or w/c?: No Grab bars in the bathroom?: No Shower chair or bench in shower?: No Elevated toilet seat or a handicapped toilet?: Yes  TIMED UP AND GO:  Was the test performed?  Yes  Length of time to ambulate 10 feet: 15 sec Gait steady and fast without use of assistive device  Cognitive Function: Declined/Normal: No cognitive concerns noted by patient or family. Patient alert, oriented, able to answer questions appropriately and recall recent events. No signs of memory loss or confusion.        04/03/2023   11:15 AM 03/20/2022    8:50 AM  6CIT Screen  What Year? 0 points 0 points  What month? 0 points 0 points  What time? 0 points 0 points  Count back from 20 0 points 0 points  Months in reverse 0 points 0 points  Repeat phrase 0 points 0 points  Total Score 0 points 0 points    Immunizations Immunization History  Administered Date(s) Administered   Fluad Quad(high Dose 65+) 07/03/2020, 07/25/2021, 09/14/2023   Influenza Split 08/14/2011, 08/02/2012, 09/07/2022   Influenza Whole 09/16/2004, 08/04/2008, 08/12/2010   Influenza, High Dose Seasonal PF 08/19/2015, 08/01/2017, 08/08/2018   Influenza,inj,Quad PF,6+ Mos 08/06/2013, 08/12/2014, 08/14/2019   PFIZER(Purple Top)SARS-COV-2 Vaccination 12/21/2019, 01/13/2020   Pneumococcal Conjugate-13 08/06/2013   Pneumococcal Polysaccharide-23 09/10/2008, 07/11/2016   Td 10/30/1993, 09/10/2008   Tdap 06/01/2020    Screening Tests Health Maintenance  Topic Date Due   Zoster Vaccines- Shingrix (1 of 2) Never done   COVID-19 Vaccine (3 - 2024-25 season) 07/01/2023    OPHTHALMOLOGY EXAM  12/29/2023   Diabetic kidney evaluation - Urine ACR  01/09/2024   FOOT EXAM  01/09/2024   HEMOGLOBIN A1C  04/15/2024   INFLUENZA VACCINE  05/30/2024   Diabetic kidney evaluation - eGFR measurement  10/15/2024   Medicare Annual Wellness (AWV)  04/03/2025   DTaP/Tdap/Td (4 - Td or Tdap) 06/01/2030   Pneumonia Vaccine 41+ Years old  Completed   HPV VACCINES  Aged Out   Meningococcal B Vaccine  Aged Out   Colonoscopy  Discontinued   Hepatitis C Screening  Discontinued    Health Maintenance  Health Maintenance Due  Topic Date Due   Zoster Vaccines- Shingrix (1 of 2) Never done   COVID-19 Vaccine (3 - 2024-25 season) 07/01/2023   OPHTHALMOLOGY EXAM  12/29/2023   Diabetic kidney evaluation - Urine ACR  01/09/2024   FOOT EXAM  01/09/2024   Health Maintenance Items Addressed: Diabetic Foot Exam recommended, Labs Ordered: UACR,  See Nurse Notes at the end of this note  Additional Screening:  Vision Screening: Recommended annual ophthalmology exams for early detection of glaucoma and other disorders of the eye. Would you like a referral to an eye doctor? No    Dental Screening: Recommended annual dental exams for proper oral hygiene  Community Resource Referral / Chronic Care Management: CRR required this visit?  No   CCM required this visit?  No   Plan:    I have personally reviewed and noted the following in the patient's chart:   Medical and social history Use of alcohol, tobacco or illicit drugs  Current medications and supplements including opioid prescriptions. Patient is currently taking opioid prescriptions. Information provided to patient regarding non-opioid alternatives. Patient advised to discuss non-opioid treatment plan with their provider. Functional ability and status Nutritional status Physical activity Advanced directives List of other physicians Hospitalizations, surgeries, and ER visits in previous 12 months Vitals Screenings to  include cognitive, depression, and falls Referrals and appointments  In addition, I have reviewed and discussed with patient certain preventive protocols, quality metrics, and best practice recommendations. A written personalized care plan for preventive services as well as general preventive health recommendations were provided to patient.   Ulrick Methot L Ankush Gintz, CMA   04/03/2024   After Visit Summary: (MyChart) Due to this being a telephonic visit, the after visit summary with patients personalized plan was offered to patient via MyChart   Notes: Please refer to Routing Comments.

## 2024-04-03 NOTE — Patient Instructions (Signed)
 Mr. David Bolton , Thank you for taking time out of your busy schedule to complete your Annual Wellness Visit with me. I enjoyed our conversation and look forward to speaking with you again next year. I, as well as your care team,  appreciate your ongoing commitment to your health goals. Please review the following plan we discussed and let me know if I can assist you in the future. Your Game plan/ To Do List    Follow up Visits: Next Medicare AWV with our clinical staff: 04/08/2025.   Have you seen your provider in the last 6 months (3 months if uncontrolled diabetes)? No Next Office Visit with your provider: Last office visit on 10/16/2023.  He stated that he will call the office to schedule with his PCP soon.  Clinician Recommendations:  Aim for 30 minutes of exercise or brisk walking, 6-8 glasses of water, and 5 servings of fruits and vegetables each day.  You are due for a foot exam and a kidney evaluation and will get those done during your next office visit.  Keep up the good work.      This is a list of the screening recommended for you and due dates:  Health Maintenance  Topic Date Due   Zoster (Shingles) Vaccine (1 of 2) Never done   COVID-19 Vaccine (3 - 2024-25 season) 07/01/2023   Eye exam for diabetics  12/29/2023   Yearly kidney health urinalysis for diabetes  01/09/2024   Complete foot exam   01/09/2024   Hemoglobin A1C  04/15/2024   Flu Shot  05/30/2024   Yearly kidney function blood test for diabetes  10/15/2024   Medicare Annual Wellness Visit  04/03/2025   DTaP/Tdap/Td vaccine (4 - Td or Tdap) 06/01/2030   Pneumonia Vaccine  Completed   HPV Vaccine  Aged Out   Meningitis B Vaccine  Aged Out   Colon Cancer Screening  Discontinued   Hepatitis C Screening  Discontinued    Advanced directives: (Copy Requested) Please bring a copy of your health care power of attorney and living will to the office to be added to your chart at your convenience. You can mail to [facility] [address] [place], [place] [address] or email to [email or web address] Advance Care Planning is important because it:  [x]  Makes sure you receive the medical care that is consistent with your values, goals, and preferences  [x]  It provides guidance to your family and loved ones and reduces their decisional burden about whether or not they are making the right decisions based on your wishes.  Follow the link provided in your after visit summary or read over the paperwork we have mailed to you to help you started getting your Advance Directives in place. If you need assistance in completing these, please reach out to us  so that we can help you!  See attachments for Preventive Care and Fall Prevention Tips.

## 2024-04-21 ENCOUNTER — Other Ambulatory Visit: Payer: Self-pay | Admitting: Internal Medicine

## 2024-04-22 ENCOUNTER — Other Ambulatory Visit: Payer: Self-pay

## 2024-05-06 ENCOUNTER — Other Ambulatory Visit: Payer: Self-pay | Admitting: Internal Medicine

## 2024-06-02 ENCOUNTER — Other Ambulatory Visit (HOSPITAL_COMMUNITY): Payer: Medicare Other

## 2024-07-04 ENCOUNTER — Other Ambulatory Visit: Payer: Self-pay | Admitting: Internal Medicine

## 2024-07-15 ENCOUNTER — Ambulatory Visit: Admitting: Internal Medicine

## 2024-07-15 ENCOUNTER — Encounter: Payer: Self-pay | Admitting: Internal Medicine

## 2024-07-15 ENCOUNTER — Ambulatory Visit: Payer: Self-pay | Admitting: Internal Medicine

## 2024-07-15 VITALS — BP 146/74 | HR 65 | Temp 98.0°F | Ht 68.0 in | Wt 171.8 lb

## 2024-07-15 DIAGNOSIS — E559 Vitamin D deficiency, unspecified: Secondary | ICD-10-CM

## 2024-07-15 DIAGNOSIS — I10 Essential (primary) hypertension: Secondary | ICD-10-CM | POA: Diagnosis not present

## 2024-07-15 DIAGNOSIS — E7800 Pure hypercholesterolemia, unspecified: Secondary | ICD-10-CM | POA: Diagnosis not present

## 2024-07-15 DIAGNOSIS — E1165 Type 2 diabetes mellitus with hyperglycemia: Secondary | ICD-10-CM

## 2024-07-15 DIAGNOSIS — Z0001 Encounter for general adult medical examination with abnormal findings: Secondary | ICD-10-CM

## 2024-07-15 DIAGNOSIS — E538 Deficiency of other specified B group vitamins: Secondary | ICD-10-CM | POA: Diagnosis not present

## 2024-07-15 DIAGNOSIS — M542 Cervicalgia: Secondary | ICD-10-CM

## 2024-07-15 DIAGNOSIS — G8929 Other chronic pain: Secondary | ICD-10-CM | POA: Diagnosis not present

## 2024-07-15 LAB — CBC WITH DIFFERENTIAL/PLATELET
Basophils Absolute: 0 K/uL (ref 0.0–0.1)
Basophils Relative: 0.5 % (ref 0.0–3.0)
Eosinophils Absolute: 0.1 K/uL (ref 0.0–0.7)
Eosinophils Relative: 1.9 % (ref 0.0–5.0)
HCT: 40.7 % (ref 39.0–52.0)
Hemoglobin: 13.7 g/dL (ref 13.0–17.0)
Lymphocytes Relative: 20.1 % (ref 12.0–46.0)
Lymphs Abs: 1.6 K/uL (ref 0.7–4.0)
MCHC: 33.8 g/dL (ref 30.0–36.0)
MCV: 90.4 fl (ref 78.0–100.0)
Monocytes Absolute: 0.7 K/uL (ref 0.1–1.0)
Monocytes Relative: 8.8 % (ref 3.0–12.0)
Neutro Abs: 5.4 K/uL (ref 1.4–7.7)
Neutrophils Relative %: 68.7 % (ref 43.0–77.0)
Platelets: 229 K/uL (ref 150.0–400.0)
RBC: 4.5 Mil/uL (ref 4.22–5.81)
RDW: 12.8 % (ref 11.5–15.5)
WBC: 7.9 K/uL (ref 4.0–10.5)

## 2024-07-15 LAB — HEPATIC FUNCTION PANEL
ALT: 11 U/L (ref 0–53)
AST: 19 U/L (ref 0–37)
Albumin: 4.2 g/dL (ref 3.5–5.2)
Alkaline Phosphatase: 54 U/L (ref 39–117)
Bilirubin, Direct: 0.2 mg/dL (ref 0.0–0.3)
Total Bilirubin: 0.7 mg/dL (ref 0.2–1.2)
Total Protein: 7.1 g/dL (ref 6.0–8.3)

## 2024-07-15 LAB — MICROALBUMIN / CREATININE URINE RATIO
Creatinine,U: 86.6 mg/dL
Microalb Creat Ratio: 35.2 mg/g — ABNORMAL HIGH (ref 0.0–30.0)
Microalb, Ur: 3.1 mg/dL — ABNORMAL HIGH (ref 0.0–1.9)

## 2024-07-15 LAB — VITAMIN D 25 HYDROXY (VIT D DEFICIENCY, FRACTURES): VITD: 80.21 ng/mL (ref 30.00–100.00)

## 2024-07-15 LAB — URINALYSIS, ROUTINE W REFLEX MICROSCOPIC
Bilirubin Urine: NEGATIVE
Hgb urine dipstick: NEGATIVE
Ketones, ur: NEGATIVE
Leukocytes,Ua: NEGATIVE
Nitrite: NEGATIVE
Specific Gravity, Urine: 1.02 (ref 1.000–1.030)
Total Protein, Urine: NEGATIVE
Urine Glucose: NEGATIVE
Urobilinogen, UA: 0.2 (ref 0.0–1.0)
pH: 6 (ref 5.0–8.0)

## 2024-07-15 LAB — LIPID PANEL
Cholesterol: 136 mg/dL (ref 0–200)
HDL: 49.6 mg/dL (ref >39.00)
LDL Cholesterol: 62 mg/dL (ref 0–99)
NonHDL: 86.89
Total CHOL/HDL Ratio: 3
Triglycerides: 124 mg/dL (ref 0.0–149.0)
VLDL: 24.8 mg/dL (ref 0.0–40.0)

## 2024-07-15 LAB — TSH: TSH: 1.98 u[IU]/mL (ref 0.35–5.50)

## 2024-07-15 LAB — VITAMIN B12: Vitamin B-12: 401 pg/mL (ref 211–911)

## 2024-07-15 LAB — HEMOGLOBIN A1C: Hgb A1c MFr Bld: 6.9 % — ABNORMAL HIGH (ref 4.6–6.5)

## 2024-07-15 MED ORDER — HYDROCODONE-ACETAMINOPHEN 10-325 MG PO TABS: 1.0000 | ORAL_TABLET | Freq: Every evening | ORAL | 0 refills | Status: DC | PRN

## 2024-07-15 NOTE — Assessment & Plan Note (Signed)
 Lab Results  Component Value Date   LDLCALC 50 10/16/2023   Stable, pt to continue current statin lipitor 20 mg qd

## 2024-07-15 NOTE — Patient Instructions (Addendum)
 Please have your Shingrix (shingles) shots done at your local pharmacy.  You had the flu shot today  The hydrocodone  was changed to 1 whole pill at bedtime as needed  Please continue all other medications as before, and refills have been done if requested.  Please have the pharmacy call with any other refills you may need.  Please continue your efforts at being more active, low cholesterol diet, and weight control.  You are otherwise up to date with prevention measures today.  Please keep your appointments with your specialists as you may have planned  Please go to the LAB at the blood drawing area for the tests to be done  You will be contacted by phone if any changes need to be made immediately.  Otherwise, you will receive a letter about your results with an explanation, but please check with MyChart first.  Please make an Appointment to return in 6 months, or sooner if needed

## 2024-07-15 NOTE — Progress Notes (Signed)
 Patient ID: David Bolton, male   DOB: 12-Jan-1941, 83 y.o.   MRN: [id number]         Chief Complaint:: wellness exam and chronic neck pain, dm, hjtn, hld       HPI:  David Bolton is a 82 y.o. male here for wellness exam; due for flu shot, for shingrix at pharmacy, o/w up to date                        Also Pt denies chest pain, increased sob or doe, wheezing, orthopnea, PND, increased LE swelling, palpitations, dizziness or syncope.   Pt denies polydipsia, polyuri a, or new focal neuro s/s.    Pt denies fever, wt loss, night sweats, loss of appetite, or other constitutional symptoms  Pt states he sometimes requires a whole hdyrocodone 10 325 at bedtime and was doing this before the script was changed recently to half at bedtime at the request of clarification by pharmacy.  BP has been controlled at home   Wt Readings from Last 3 Encounters:  07/15/24 171 lb 12.8 oz (77.9 kg)  04/03/24 171 lb (77.6 kg)  12/18/23 162 lb 12.8 oz (73.8 kg)   BP Readings from Last 3 Encounters:  07/15/24 (!) 146/74  04/03/24 130/78  12/18/23 (!) 161/88   Immunization History  Administered Date(s) Administered   Fluad Quad(high Dose 65+) 07/03/2020, 07/25/2021, 09/14/2023   INFLUENZA, HIGH DOSE SEASONAL PF 08/19/2015, 08/01/2017, 08/08/2018   Influenza Split 08/14/2011, 08/02/2012, 09/07/2022   Influenza Whole 09/16/2004, 08/04/2008, 08/12/2010   Influenza,inj,Quad PF,6+ Mos 08/06/2013, 08/12/2014, 08/14/2019   PFIZER(Purple Top)SARS-COV-2 Vaccination 12/21/2019, 01/13/2020   Pneumococcal Conjugate-13 08/06/2013   Pneumococcal Polysaccharide-23 09/10/2008, 07/11/2016   Td 10/30/1993, 09/10/2008   Tdap 06/01/2020   Health Maintenance Due  Topic Date Due   Zoster Vaccines- Shingrix (1 of 2) Never done   Diabetic kidney evaluation - Urine ACR  06/01/2021   HEMOGLOBIN A1C  04/15/2024   Influenza Vaccine  05/30/2024      Past Medical History:  Diagnosis Date   ALLERGIC RHINITIS 06/08/2007    ANXIETY 06/08/2007   Aortic atherosclerosis (HCC)    Aortic root dilation (HCC) 06/28/2021   a.) TTE 06/28/2021: 45 mm; b.) TTE 11/27/2023: 42 mm   Aortic stenosis 06/06/2019   a.) TTE 06/06/2019: mod AS (MPG 23 mmHg; AVA (VTI) =1.33 cm2); b.) TTE 06/28/2021: mod AS (MPG 29.3 mmHg; AVA (VTI) = 1.37 cm2); b.) TTE 11/27/2023: severe AS (MPG 42 mmHg; AVA (VTI) = 1.05 cm2)   Chronic neck pain    COLONIC POLYPS, HX OF 06/08/2007   Cough 08/12/2010   DDD (degenerative disc disease), cervical    DEPENDENCE, ALCOHOL NEC/NOS, IN REMISSION 06/08/2007   DEPRESSION 06/08/2007   Diastolic dysfunction 11/27/2023   a.) TTE 11/27/2023: EF 60-65%, mild LVH, G1DD, mild RAE/RVE, mild LAE, AoV calcified with mild AR, sev AS (MPG 42 mmHg), asc AO 42 mm   DIVERTICULOSIS, COLON 06/08/2007   FATIGUE 09/10/2008   GERD 06/08/2007   Granulomatous disease (HCC)    a.) calcified granulomata LEFT upper lobe, LEFT lung base, and in spleen   Headache(784.0) 06/08/2007   Hemorrhoids    HYPERLIPIDEMIA 06/08/2007   HYPERTENSION 06/08/2007   IBS 06/08/2007   LOW BACK PAIN, CHRONIC 08/12/2010   Multilevel neural foraminal stenosis    Nocturia 07/16/2009   PILONIDAL CYST 06/08/2007   Pilonidal disease s/p excision    PLANTAR FASCIITIS 06/08/2007   PSA elevation  Right inguinal hernia    T2DM (type 2 diabetes mellitus) (HCC)    Umbilical hernia    Past Surgical History:  Procedure Laterality Date   BLADDER SURGERY     CERVICAL FUSION     COLONOSCOPY     INSERTION OF MESH  12/07/2023   Procedure: INSERTION OF MESH;  Surgeon: Desiderio Schanz, MD;  Location: [facility] ORS;  Service: General;;   PILONIDAL CYST EXCISION     UMBILICAL HERNIA REPAIR N/A 12/07/2023   Procedure: HERNIA REPAIR UMBILICAL ADULT, open;  Surgeon: Desiderio Schanz, MD;  Location: [facility] ORS;  Service: General;  Laterality: N/A;    reports that he has never smoked. He has never been exposed to tobacco smoke. He has never used smokeless tobacco. He reports  that he does not drink alcohol and does not use drugs. family history includes Heart attack in his paternal aunt; Heart disease in his mother; Kidney disease in an other family member. Allergies  Allergen Reactions   Sulfa Antibiotics Rash    Breaks out around private parts   Bee Venom Swelling    Yellow Jacket   Doxycycline Hyclate    Peanut-Containing Drug Products     Nose bleed, headache   Sulfonamide Derivatives    Current Outpatient Medications on File Prior to Visit  Medication Sig Dispense Refill   acetaminophen  (TYLENOL ) 500 MG tablet Take 2 tablets (1,000 mg total) by mouth every 6 (six) hours as needed for mild pain (pain score 1-3).     amLODipine  (NORVASC ) 5 MG tablet TAKE ONE TABLET BY MOUTH ONCE A DAY 90 tablet 1   atorvastatin  (LIPITOR) 20 MG tablet TAKE ONE TABLET BY MOUTH ONE TIME DAILY (Patient taking differently: Take 20 mg by mouth at bedtime.) 90 tablet 3   Cholecalciferol (VITAMIN D3) 125 MCG (5000 UT) TABS Take 5,000 Units by mouth daily.     diphenhydrAMINE (BENADRYL) 25 MG tablet Take 25 mg by mouth every 6 (six) hours as needed for itching or allergies.     lisinopril  (ZESTRIL ) 40 MG tablet TAKE ONE TABLET BY MOUTH ONCE A DAY 90 tablet 1   metFORMIN  (GLUCOPHAGE -XR) 500 MG 24 hr tablet TAKE THREE TABLETS BY MOUTH IN THE MORNING WITH BREAKFAST 270 tablet 0   metoprolol  succinate (TOPROL -XL) 50 MG 24 hr tablet TAKE ONE-HALF TABLET BY MOUTH ONCE DAILY 90 tablet 0   No current facility-administered medications on file prior to visit.        ROS:  All others reviewed and negative.  Objective        PE:  BP (!) 146/74   Pulse 65   Temp 98 F (36.7 C)   Ht 5' 8 (1.727 m)   Wt 171 lb 12.8 oz (77.9 kg)   SpO2 99%   BMI 26.12 kg/m                 Constitutional: Pt appears in NAD               HENT: Head: NCAT.                Right Ear: External ear normal.                 Left Ear: External ear normal.                Eyes: . Pupils are equal, round, and  reactive to light. Conjunctivae and EOM are normal  Nose: without d/c or deformity               Neck: Neck supple. Gross normal ROM               Cardiovascular: Normal rate and regular rhythm.                 Pulmonary/Chest: Effort normal and breath sounds without rales or wheezing.                Abd:  Soft, NT, ND, + BS, no organomegaly               Neurological: Pt is alert. At baseline orientation, motor grossly intact               Skin: Skin is warm. No rashes, no other new lesions, LE edema - none               Psychiatric: Pt behavior is normal without agitation   Micro: none  Cardiac tracings I have personally interpreted today:  none  Pertinent Radiological findings (summarize): none   Lab Results  Component Value Date   WBC 6.3 10/16/2023   HGB 14.8 10/16/2023   HCT 44.7 10/16/2023   PLT 249.0 10/16/2023   GLUCOSE 114 (H) 10/16/2023   CHOL 125 10/16/2023   TRIG 99.0 10/16/2023   HDL 54.80 10/16/2023   LDLDIRECT 139.0 02/10/2011   LDLCALC 50 10/16/2023   ALT 12 10/16/2023   AST 21 10/16/2023   NA 137 10/16/2023   K 4.2 10/16/2023   CL 103 10/16/2023   CREATININE 0.79 10/16/2023   BUN 12 10/16/2023   CO2 27 10/16/2023   TSH 1.431 03/23/2023   PSA 3.13 03/20/2022   HGBA1C 6.4 10/16/2023   MICROALBUR 6.8 06/01/2020   Assessment/Plan:  David Bolton is a 82 y.o. White or Caucasian [1] male with  has a past medical history of ALLERGIC RHINITIS (06/08/2007), ANXIETY (06/08/2007), Aortic atherosclerosis (HCC), Aortic root dilation (HCC) (06/28/2021), Aortic stenosis (06/06/2019), Chronic neck pain, COLONIC POLYPS, HX OF (06/08/2007), Cough (08/12/2010), DDD (degenerative disc disease), cervical, DEPENDENCE, ALCOHOL NEC/NOS, IN REMISSION (06/08/2007), DEPRESSION (06/08/2007), Diastolic dysfunction (11/27/2023), DIVERTICULOSIS, COLON (06/08/2007), FATIGUE (09/10/2008), GERD (06/08/2007), Granulomatous disease (HCC), Headache(784.0) (06/08/2007),  Hemorrhoids, HYPERLIPIDEMIA (06/08/2007), HYPERTENSION (06/08/2007), IBS (06/08/2007), LOW BACK PAIN, CHRONIC (08/12/2010), Multilevel neural foraminal stenosis, Nocturia (07/16/2009), PILONIDAL CYST (06/08/2007), Pilonidal disease s/p excision, PLANTAR FASCIITIS (06/08/2007), PSA elevation, Right inguinal hernia, T2DM (type 2 diabetes mellitus) (HCC), and Umbilical hernia.  Encounter for well adult exam with abnormal findings Age and sex appropriate education and counseling updated with regular exercise and diet Referrals for preventative services - none needed Immunizations addressed - for flu shot today Smoking counseling  - none needed Evidence for depression or other mood disorder - none significant Most recent labs reviewed. I have personally reviewed and have noted: 1) the patient's medical and social history 2) The patient's current medications and supplements 3) The patient's height, weight, and BMI have been recorded in the chart   Chronic neck pain Ok to change the hydrococone 10 325 back to 1 po at bedtime prn,   Diabetes (HCC) Lab Results  Component Value Date   HGBA1C 6.4 10/16/2023   Stable, pt to continue current medical treatment metfomrin ER 500 mg - 3 qd   Essential hypertension BP Readings from Last 3 Encounters:  07/15/24 (!) 146/74  04/03/24 130/78  12/18/23 (!) 161/88   Uncontrolled, but pt states controlled at home, pt to  continue medical treatment norvasc  5 every day, lisinopril  40 every day, toprol  xl 25 qd   Hyperlipidemia Lab Results  Component Value Date   LDLCALC 50 10/16/2023   Stable, pt to continue current statin lipitor 20 mg qd  Followup: Return in about 6 months (around 01/12/2025).  Lynwood Rush, MD 07/15/2024 2:40 PM [facility]

## 2024-07-15 NOTE — Assessment & Plan Note (Signed)
Age and sex appropriate education and counseling updated with regular exercise and diet Referrals for preventative services - none needed Immunizations addressed - for flu shot today Smoking counseling  - none needed Evidence for depression or other mood disorder - none significant Most recent labs reviewed. I have personally reviewed and have noted: 1) the patient's medical and social history 2) The patient's current medications and supplements 3) The patient's height, weight, and BMI have been recorded in the chart  

## 2024-07-15 NOTE — Assessment & Plan Note (Signed)
 BP Readings from Last 3 Encounters:  07/15/24 (!) 146/74  04/03/24 130/78  12/18/23 (!) 161/88   Uncontrolled, but pt states controlled at home, pt to continue medical treatment norvasc  5 every day, lisinopril  40 every day, toprol  xl 25 qd

## 2024-07-15 NOTE — Progress Notes (Signed)
 The test results show that your current treatment is OK, as the tests are stable.  Please continue the same plan.  There is no other need for change of treatment or further evaluation based on these results, at this time.  thanks

## 2024-07-15 NOTE — Assessment & Plan Note (Signed)
 Lab Results  Component Value Date   HGBA1C 6.4 10/16/2023   Stable, pt to continue current medical treatment metfomrin ER 500 mg - 3 qd

## 2024-07-15 NOTE — Assessment & Plan Note (Signed)
 Ok to change the hydrococone 10 325 back to 1 po at bedtime prn,

## 2024-07-19 LAB — BASIC METABOLIC PANEL WITH GFR
BUN: 12 mg/dL (ref 6–23)
CO2: 24 meq/L (ref 19–32)
Calcium: 9.3 mg/dL (ref 8.4–10.5)
Chloride: 106 meq/L (ref 96–112)
Creatinine, Ser: 0.75 mg/dL (ref 0.40–1.50)
GFR: 85.06 mL/min (ref >60.00)
Glucose, Bld: 106 mg/dL — ABNORMAL HIGH (ref 70–99)
Potassium: 3.8 meq/L (ref 3.5–5.1)
Sodium: 137 meq/L (ref 135–145)

## 2024-09-08 ENCOUNTER — Encounter: Payer: Self-pay | Admitting: Student

## 2024-10-10 ENCOUNTER — Other Ambulatory Visit: Payer: Self-pay | Admitting: Internal Medicine

## 2024-10-18 ENCOUNTER — Other Ambulatory Visit: Payer: Self-pay | Admitting: Internal Medicine

## 2024-10-20 ENCOUNTER — Other Ambulatory Visit: Payer: Self-pay

## 2024-10-24 ENCOUNTER — Other Ambulatory Visit: Payer: Self-pay | Admitting: Internal Medicine

## 2024-10-27 ENCOUNTER — Other Ambulatory Visit: Payer: Self-pay

## 2024-12-02 ENCOUNTER — Other Ambulatory Visit: Payer: Self-pay | Admitting: Internal Medicine

## 2025-04-08 ENCOUNTER — Ambulatory Visit
